# Patient Record
Sex: Male | Born: 1950 | Marital: Married | Smoking: Never smoker
Health system: Southern US, Community
[De-identification: ages and names within clinical notes are randomized; demographics above are authoritative.]

## PROBLEM LIST (undated history)

## (undated) DIAGNOSIS — H269 Unspecified cataract: Secondary | ICD-10-CM

## (undated) DIAGNOSIS — H35039 Hypertensive retinopathy, unspecified eye: Secondary | ICD-10-CM

## (undated) DIAGNOSIS — N189 Chronic kidney disease, unspecified: Secondary | ICD-10-CM

## (undated) DIAGNOSIS — I4821 Permanent atrial fibrillation: Secondary | ICD-10-CM

## (undated) DIAGNOSIS — I4891 Unspecified atrial fibrillation: Secondary | ICD-10-CM

## (undated) DIAGNOSIS — Z9289 Personal history of other medical treatment: Secondary | ICD-10-CM

## (undated) DIAGNOSIS — I10 Essential (primary) hypertension: Secondary | ICD-10-CM

## (undated) HISTORY — DX: Unspecified cataract: H26.9

## (undated) HISTORY — DX: Hypertensive retinopathy, unspecified eye: H35.039

## (undated) HISTORY — DX: Unspecified atrial fibrillation: I48.91

## (undated) HISTORY — DX: Chronic kidney disease, unspecified: N18.9

## (undated) HISTORY — DX: Permanent atrial fibrillation: I48.21

## (undated) HISTORY — PX: CATARACT EXTRACTION: SUR2

## (undated) HISTORY — DX: Personal history of other medical treatment: Z92.89

## (undated) HISTORY — DX: Essential (primary) hypertension: I10

---

## 2017-03-26 ENCOUNTER — Encounter (HOSPITAL_COMMUNITY): Payer: Self-pay | Admitting: Emergency Medicine

## 2017-03-26 ENCOUNTER — Emergency Department (HOSPITAL_COMMUNITY)
Admission: EM | Admit: 2017-03-26 | Discharge: 2017-03-26 | Disposition: A | Discharge status: Home / Self Care | Payer: Medicare Other | Attending: Emergency Medicine | Admitting: Emergency Medicine

## 2017-03-26 ENCOUNTER — Emergency Department (HOSPITAL_COMMUNITY): Payer: Medicare Other

## 2017-03-26 DIAGNOSIS — R002 Palpitations: Secondary | ICD-10-CM | POA: Diagnosis present

## 2017-03-26 DIAGNOSIS — I10 Essential (primary) hypertension: Secondary | ICD-10-CM | POA: Diagnosis not present

## 2017-03-26 DIAGNOSIS — F17210 Nicotine dependence, cigarettes, uncomplicated: Secondary | ICD-10-CM | POA: Diagnosis not present

## 2017-03-26 DIAGNOSIS — Z79899 Other long term (current) drug therapy: Secondary | ICD-10-CM | POA: Diagnosis not present

## 2017-03-26 DIAGNOSIS — I4891 Unspecified atrial fibrillation: Secondary | ICD-10-CM

## 2017-03-26 LAB — BASIC METABOLIC PANEL
Anion gap: 10 (ref 5–15)
BUN: 19 mg/dL (ref 6–20)
CALCIUM: 9.2 mg/dL (ref 8.9–10.3)
CO2: 25 mmol/L (ref 22–32)
CREATININE: 1.09 mg/dL (ref 0.61–1.24)
Chloride: 102 mmol/L (ref 101–111)
GFR calc Af Amer: >60 mL/min (ref >60)
GFR calc non Af Amer: >60 mL/min (ref >60)
GLUCOSE: 133 mg/dL — AB (ref 65–99)
Potassium: 3.8 mmol/L (ref 3.5–5.1)
Sodium: 137 mmol/L (ref 135–145)

## 2017-03-26 LAB — MAGNESIUM: Magnesium: 2.1 mg/dL (ref 1.7–2.4)

## 2017-03-26 LAB — CBC
HCT: 43.1 % (ref 39.0–52.0)
Hemoglobin: 15.2 g/dL (ref 13.0–17.0)
MCH: 29.2 pg (ref 26.0–34.0)
MCHC: 35.3 g/dL (ref 30.0–36.0)
MCV: 82.9 fL (ref 78.0–100.0)
PLATELETS: 153 10*3/uL (ref 150–400)
RBC: 5.2 MIL/uL (ref 4.22–5.81)
RDW: 12.9 % (ref 11.5–15.5)
WBC: 3.6 10*3/uL — ABNORMAL LOW (ref 4.0–10.5)

## 2017-03-26 LAB — I-STAT TROPONIN, ED: TROPONIN I, POC: 0 ng/mL (ref 0.00–0.08)

## 2017-03-26 MED ORDER — METOPROLOL TARTRATE 25 MG PO TABS: 25.0000 mg | ORAL_TABLET | Freq: Two times a day (BID) | ORAL | 0 refills | Status: DC

## 2017-03-26 MED ORDER — APIXABAN 5 MG PO TABS: 5.0000 mg | ORAL_TABLET | Freq: Two times a day (BID) | ORAL | 0 refills | Status: DC

## 2017-03-26 MED ORDER — METOPROLOL TARTRATE 25 MG PO TABS
25.0000 mg | ORAL_TABLET | Freq: Once | ORAL | Status: AC
  Administered 2017-03-26: 25 mg via ORAL
  Filled 2017-03-26: qty 1

## 2017-03-26 NOTE — ED Notes (Signed)
To x-ray

## 2017-03-26 NOTE — ED Triage Notes (Signed)
Pt went for work physical and was found to have htn and new onset afib

## 2017-03-26 NOTE — ED Provider Notes (Signed)
[facility] EMERGENCY DEPARTMENT Provider Note   CSN: [id number] Arrival date & time: 03/26/17  1331     History   Chief Complaint Chief Complaint  Patient presents with  . Palpitations    HPI Billy Harrell is a 66 y.o. male.  HPI  66 year old male sent in from [place] walk-in clinic for new onset atrial fibrillation.  The patient was having an insurance physical which she has every year.  Nurse noted that he had an irregular heart rate and sent him to the walk-in clinic for evaluation.  He was also noted to be hypertensive.  The patient states he has not actually seen a doctor in about 20 years.  He has no known medical problems but admits he has not seen a PCP either.  He denies any current palpitations.  When asked if he has had palpitations recently he states that he has irregular beating in his chest "sometimes".  When trying to clarify when exactly or when the most recent time was he has trouble clarifying.  However he seems to indicate that it has been an ongoing issue but mild.  No chest pain, headache, lightheadedness, shortness of breath.  No acute complaints. One BP taken by the nurse was 198/128.  History reviewed. No pertinent past medical history.  There are no active problems to display for this patient.   History reviewed. No pertinent surgical history.     Home Medications    Prior to Admission medications   Medication Sig Start Date End Date Taking? Authorizing Provider  cholecalciferol (VITAMIN D) 1000 units tablet Take 1,000 Units by mouth daily.   Yes [provider]  loratadine (CLARITIN) 10 MG tablet Take 10 mg by mouth daily.   Yes [provider]  Omega-3 Fatty Acids (FISH OIL) 1000 MG CAPS Take 1,000 mg by mouth daily.   Yes [provider]  apixaban (ELIQUIS) 5 MG TABS tablet Take 1 tablet (5 mg total) by mouth 2 (two) times daily. 03/26/17   Pricilla Loveless, MD  metoprolol tartrate (LOPRESSOR) 25 MG tablet  Take 1 tablet (25 mg total) by mouth 2 (two) times daily. 03/26/17   Pricilla Loveless, MD    Family History No family history on file.  Social History Social History  Substance Use Topics  . Smoking status: Current Every Day Smoker  . Smokeless tobacco: Never Used  . Alcohol use No     Allergies   Patient has no known allergies.   Review of Systems Review of Systems  Constitutional: Negative for fever.  Respiratory: Negative for shortness of breath.   Cardiovascular: Negative for chest pain.  Gastrointestinal: Negative for vomiting.  Neurological: Negative for dizziness, numbness and headaches.  All other systems reviewed and are negative.    Physical Exam Updated Vital Signs BP (!) 165/134   Pulse 87   Temp 98 F (36.7 C)   Resp 14   SpO2 97%   Physical Exam  Constitutional: He is oriented to person, place, and time. He appears well-developed and well-nourished. No distress.  HENT:  Head: Normocephalic and atraumatic.  Right Ear: External ear normal.  Left Ear: External ear normal.  Nose: Nose normal.  Eyes: Right eye exhibits no discharge. Left eye exhibits no discharge.  Neck: Neck supple.  Cardiovascular: Normal rate and normal heart sounds.  An irregular rhythm present.  Pulses:      Radial pulses are 2+ on the right side, and 2+ on the left side.  Pulmonary/Chest: Effort  normal and breath sounds normal.  Abdominal: Soft. There is no tenderness.  Musculoskeletal: He exhibits no edema.  Neurological: He is alert and oriented to person, place, and time.  Skin: Skin is warm and dry. He is not diaphoretic.  Nursing note and vitals reviewed.    ED Treatments / Results  Labs (all labs ordered are listed, but only abnormal results are displayed) Labs Reviewed  BASIC METABOLIC PANEL - Abnormal; Notable for the following:       Result Value   Glucose, Bld 133 (*)    All other components within normal limits  CBC - Abnormal; Notable for the following:     WBC 3.6 (*)    All other components within normal limits  MAGNESIUM  I-STAT TROPONIN, ED    EKG  EKG Interpretation  Date/Time:  Monday March 26 2017 13:36:15 EDT Ventricular Rate:  106 PR Interval:    QRS Duration: 86 QT Interval:  334 QTC Calculation: 443 R Axis:   90 Text Interpretation:  Atrial fibrillation with rapid ventricular response Rightward axis ST abnormality, possible digitalis effect Abnormal ECG No old tracing to compare Confirmed by Pricilla Loveless [phone]) on 03/26/2017 1:53:52 PM Also confirmed by Pricilla Loveless [phone]), editor Misty Stanley [phone])  on 03/26/2017 2:57:00 PM       Radiology Dg Chest 2 View  Result Date: 03/26/2017 CLINICAL DATA:  66 year old male with hypertension. EXAM: CHEST  2 VIEW COMPARISON:  None. FINDINGS: The lungs are clear and negative for focal airspace consolidation, pulmonary edema or suspicious pulmonary nodule. No pleural effusion or pneumothorax. Mild cardiomegaly. No acute fracture or lytic or blastic osseous lesions. The visualized upper abdominal bowel gas pattern is unremarkable. IMPRESSION: No active cardiopulmonary disease. Mild cardiomegaly. Electronically Signed   By: Malachy Moan M.D.   On: 03/26/2017 14:21    Procedures Procedures (including critical care time)  Medications Ordered in ED Medications  metoprolol tartrate (LOPRESSOR) tablet 25 mg (25 mg Oral Given 03/26/17 1534)     Initial Impression / Assessment and Plan / ED Course  I have reviewed the triage vital signs and the nursing notes.  Pertinent labs & imaging results that were available during my care of the patient were reviewed by me and considered in my medical decision making (see chart for details).     The patient is asymptomatic.  He does have A. fib, bouncing between the 90s and low 100s.  He is well-appearing.  No anginal symptoms.  Given he is not symptomatically now and there is no clear onset, he is not a candidate for ED  cardioversion.  He denies any significant past medical history but has not seen a PCP in 20+ years.  He has been quite hypertensive today and my suspicion is he has a history of hypertension.  Thus, we will treat as if he has hypertension and his chads vasc score is now 2.  Lab work unremarkable.  He appears stable for outpatient management with metoprolol and Eliquis.  I discussed risks and benefits of anticoagulation and he understands.  Discussed return precautions.  Follow-up in A. fib clinic.  This patients CHA2DS2-VASc Score and unadjusted Ischemic Stroke Rate (% per year) is equal to 2.2 % stroke rate/year from a score of 2  Above score calculated as 1 point each if present [CHF, HTN, DM, Vascular=MI/PAD/Aortic Plaque, Age if 65-74, or Male] Above score calculated as 2 points each if present [Age > 75, or Stroke/TIA/TE]   Final Clinical Impressions(s) / ED  Diagnoses   Final diagnoses:  New onset atrial fibrillation (HCC)  Essential hypertension    New Prescriptions New Prescriptions   APIXABAN (ELIQUIS) 5 MG TABS TABLET    Take 1 tablet (5 mg total) by mouth 2 (two) times daily.   METOPROLOL TARTRATE (LOPRESSOR) 25 MG TABLET    Take 1 tablet (25 mg total) by mouth 2 (two) times daily.     Pricilla LovelessGoldston, Sula Fetterly, MD 03/26/17 [phone]

## 2017-04-02 ENCOUNTER — Encounter (HOSPITAL_COMMUNITY): Payer: Self-pay | Admitting: Nurse Practitioner

## 2017-04-02 ENCOUNTER — Ambulatory Visit (HOSPITAL_COMMUNITY)
Admission: RE | Admit: 2017-04-02 | Discharge: 2017-04-02 | Disposition: A | Discharge status: Home / Self Care | Payer: Medicare Other | Source: Ambulatory Visit | Attending: Nurse Practitioner | Admitting: Nurse Practitioner

## 2017-04-02 VITALS — BP 164/134 | HR 70 | Resp 98 | Wt 192.8 lb

## 2017-04-02 DIAGNOSIS — Z79899 Other long term (current) drug therapy: Secondary | ICD-10-CM | POA: Insufficient documentation

## 2017-04-02 DIAGNOSIS — I481 Persistent atrial fibrillation: Secondary | ICD-10-CM

## 2017-04-02 DIAGNOSIS — Z7901 Long term (current) use of anticoagulants: Secondary | ICD-10-CM | POA: Insufficient documentation

## 2017-04-02 DIAGNOSIS — F17200 Nicotine dependence, unspecified, uncomplicated: Secondary | ICD-10-CM | POA: Insufficient documentation

## 2017-04-02 DIAGNOSIS — I10 Essential (primary) hypertension: Secondary | ICD-10-CM | POA: Diagnosis not present

## 2017-04-02 DIAGNOSIS — I4891 Unspecified atrial fibrillation: Secondary | ICD-10-CM | POA: Diagnosis not present

## 2017-04-02 DIAGNOSIS — I4819 Other persistent atrial fibrillation: Secondary | ICD-10-CM

## 2017-04-02 MED ORDER — LOSARTAN POTASSIUM 50 MG PO TABS: 50.0000 mg | ORAL_TABLET | Freq: Every day | ORAL | 3 refills | Status: DC

## 2017-04-02 NOTE — Patient Instructions (Signed)
Your physician has recommended you make the following change in your medication: 1)Start Losartan 50mg  once a day

## 2017-04-02 NOTE — Progress Notes (Signed)
Primary Care Physician: Patient, No Pcp Per Referring Physician: [facility] f/u   Billy Harrell is a 66 y.o. male that was found to be in afib when an [facility] nurse made a visit and found pt to be in afib.He was sent to [facility] and was not established there and was sent on to the ER. In the ER he was found to be very hypertensive. He does not go to the Doctor. He does not have a PCP. 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 He has not really noted any fatigue or shortness of breathe.BP remains elevated today. He states that he usually feels well. He was not cardioverted in the ER, due to not knowing onset of afib. Pt cannot identify seeing  condition change over the last few weeks to months. He was started on Eliquis 5 mg bid for a chadsvasc score of 2(age, htn).  Denies, any alcohol, tobacco use, no excessive caffeine use, snoring unknown as pt lives by himself. Here with a daughter today.  Today, he denies symptoms of palpitations, chest pain, shortness of breath, orthopnea, PND, lower extremity edema, dizziness, presyncope, syncope, or neurologic sequela. The patient is tolerating medications without difficulties and is otherwise without complaint today.   No past medical history on file. No past surgical history on file.  Current Outpatient Prescriptions  Medication Sig Dispense Refill  . apixaban (ELIQUIS) 5 MG TABS tablet Take 1 tablet (5 mg total) by mouth 2 (two) times daily. 60 tablet 0  . cholecalciferol (VITAMIN D) 1000 units tablet Take 1,000 Units by mouth daily.    Marland Kitchen loratadine (CLARITIN) 10 MG tablet Take 10 mg by mouth daily.    . metoprolol tartrate (LOPRESSOR) 25 MG tablet Take 1 tablet (25 mg total) by mouth 2 (two) times daily. 60 tablet 0  . Omega-3 Fatty Acids (FISH OIL) 1000 MG CAPS Take 1,000 mg by mouth daily.    . Oxymetazoline HCl (NASAL SPRAY NA) Place into the nose daily.    Marland Kitchen losartan (COZAAR) 50 MG tablet Take 1 tablet (50  mg total) by mouth daily. 30 tablet 3   No current facility-administered medications for this encounter.     No Known Allergies  Social History   Social History  . Marital status: Unknown    Spouse name: N/A  . Number of children: N/A  . Years of education: N/A   Occupational History  . Not on file.   Social History Main Topics  . Smoking status: Current Every Day Smoker  . Smokeless tobacco: Never Used  . Alcohol use No  . Drug use: No  . Sexual activity: Not on file   Other Topics Concern  . Not on file   Social History Narrative  . No narrative on file    No family history on file.  ROS- All systems are reviewed and negative except as per the HPI above  Physical Exam: Vitals:   04/02/17 0938  BP: (!) 164/134  Pulse: 70  Resp: (!) 98  Weight: 192 lb 12 oz (87.4 kg)   Wt Readings from Last 3 Encounters:  04/02/17 192 lb 12 oz (87.4 kg)    Labs: Lab Results  Component Value Date   NA 137 03/26/2017   K 3.8 03/26/2017   CL 102 03/26/2017   CO2 25 03/26/2017   GLUCOSE 133 (H) 03/26/2017   BUN 19 03/26/2017   CREATININE 1.09 03/26/2017   CALCIUM 9.2 03/26/2017  MG 2.1 03/26/2017   No results found for: INR No results found for: CHOL, HDL, LDLCALC, TRIG   GEN- The patient is well appearing, alert and oriented x 3 today.   Head- normocephalic, atraumatic Eyes-  Sclera clear, conjunctiva pink Ears- hearing intact Oropharynx- clear Neck- supple, no JVP Lymph- no cervical lymphadenopathy Lungs- Clear to ausculation bilaterally, normal work of breathing Heart- irregular rate and rhythm, no murmurs, rubs or gallops, PMI not laterally displaced GI- soft, NT, ND, + BS Extremities- no clubbing, cyanosis, or edema MS- no significant deformity or atrophy Skin- no rash or lesion Psych- euthymic mood, full affect Neuro- strength and sensation are intact  EKG-atrial fib at 73 bpm, qrs int 86 ms, qtc 434 ms Epic records reviewed    Assessment and  Plan: 1. New onset afib General education re afib Unsure as to onset, but will continue to  load on eliquis 5 mg x 3 weeks(chadsvasc score of 2) and then discuss cardioversion Denies any bleeding issues, bleeding precautions discussed Echo  2. HTN May be trigger for afib Will add losartan 50 mg a day Recheck BP with bmet/TSH on Friday after echo appointment Avoid salt  Encouraged to get established with PCP  Elvina Sidleonna C. Matthew Folksarroll, ANP-C [facility]  [address] [place], [place] [address] [phone]

## 2017-04-05 ENCOUNTER — Ambulatory Visit (HOSPITAL_COMMUNITY): Payer: Medicare Other | Admitting: Nurse Practitioner

## 2017-04-06 ENCOUNTER — Ambulatory Visit (HOSPITAL_COMMUNITY)
Admission: RE | Admit: 2017-04-06 | Discharge: 2017-04-06 | Disposition: A | Discharge status: Home / Self Care | Payer: Medicare Other | Source: Ambulatory Visit | Attending: Nurse Practitioner | Admitting: Nurse Practitioner

## 2017-04-06 ENCOUNTER — Encounter (HOSPITAL_COMMUNITY): Payer: Self-pay | Admitting: Nurse Practitioner

## 2017-04-06 VITALS — BP 160/120 | HR 96 | Wt 191.4 lb

## 2017-04-06 DIAGNOSIS — I77811 Abdominal aortic ectasia: Secondary | ICD-10-CM | POA: Diagnosis not present

## 2017-04-06 DIAGNOSIS — I481 Persistent atrial fibrillation: Secondary | ICD-10-CM | POA: Insufficient documentation

## 2017-04-06 DIAGNOSIS — I081 Rheumatic disorders of both mitral and tricuspid valves: Secondary | ICD-10-CM | POA: Insufficient documentation

## 2017-04-06 DIAGNOSIS — Z720 Tobacco use: Secondary | ICD-10-CM | POA: Insufficient documentation

## 2017-04-06 DIAGNOSIS — I371 Nonrheumatic pulmonary valve insufficiency: Secondary | ICD-10-CM | POA: Diagnosis not present

## 2017-04-06 DIAGNOSIS — I4819 Other persistent atrial fibrillation: Secondary | ICD-10-CM

## 2017-04-06 DIAGNOSIS — I10 Essential (primary) hypertension: Secondary | ICD-10-CM

## 2017-04-06 LAB — BASIC METABOLIC PANEL
ANION GAP: 7 (ref 5–15)
BUN: 27 mg/dL — ABNORMAL HIGH (ref 6–20)
CALCIUM: 9.2 mg/dL (ref 8.9–10.3)
CO2: 26 mmol/L (ref 22–32)
Chloride: 106 mmol/L (ref 101–111)
Creatinine, Ser: 1.25 mg/dL — ABNORMAL HIGH (ref 0.61–1.24)
GFR, EST NON AFRICAN AMERICAN: 58 mL/min — AB (ref >60)
Glucose, Bld: 96 mg/dL (ref 65–99)
Potassium: 4.3 mmol/L (ref 3.5–5.1)
Sodium: 139 mmol/L (ref 135–145)

## 2017-04-06 LAB — TSH: TSH: 1.674 u[IU]/mL (ref 0.350–4.500)

## 2017-04-06 NOTE — Progress Notes (Signed)
  Echocardiogram 2D Echocardiogram has been performed.  Billy SavoyCasey N Rakesha Harrell 04/06/2017, 8:54 AM

## 2017-04-06 NOTE — Progress Notes (Signed)
Pt BP still elevated, but has not taken metoprolol this am, I got 180/100. He did start losartan 50 mg 3 days ago. He will get a BP cuff and track readings at home, see back in one week for review BP, echo and afib. Bmet/tsh today

## 2017-04-13 ENCOUNTER — Ambulatory Visit (HOSPITAL_COMMUNITY)
Admission: RE | Admit: 2017-04-13 | Discharge: 2017-04-13 | Disposition: A | Discharge status: Home / Self Care | Payer: Medicare Other | Source: Ambulatory Visit | Attending: Nurse Practitioner | Admitting: Nurse Practitioner

## 2017-04-13 VITALS — BP 166/104 | HR 67

## 2017-04-13 DIAGNOSIS — F17200 Nicotine dependence, unspecified, uncomplicated: Secondary | ICD-10-CM | POA: Insufficient documentation

## 2017-04-13 DIAGNOSIS — I10 Essential (primary) hypertension: Secondary | ICD-10-CM | POA: Insufficient documentation

## 2017-04-13 DIAGNOSIS — Z7901 Long term (current) use of anticoagulants: Secondary | ICD-10-CM | POA: Insufficient documentation

## 2017-04-13 DIAGNOSIS — Z79899 Other long term (current) drug therapy: Secondary | ICD-10-CM | POA: Insufficient documentation

## 2017-04-13 DIAGNOSIS — I480 Paroxysmal atrial fibrillation: Secondary | ICD-10-CM | POA: Diagnosis not present

## 2017-04-13 DIAGNOSIS — I481 Persistent atrial fibrillation: Secondary | ICD-10-CM

## 2017-04-13 DIAGNOSIS — I4891 Unspecified atrial fibrillation: Secondary | ICD-10-CM | POA: Diagnosis present

## 2017-04-13 DIAGNOSIS — I4819 Other persistent atrial fibrillation: Secondary | ICD-10-CM

## 2017-04-13 NOTE — Patient Instructions (Signed)
Cardioversion scheduled for Monday, November 26th  - Come for labs at 9am in afib clinic  - Ar[facility] at [facility] and go to admitting at 9:30AM  -Do not eat or drink anything after midnight the night prior to your procedure.  - Take all your medication with a sip of water prior to arrival.  - You will not be able to drive home after your procedure.

## 2017-04-13 NOTE — Progress Notes (Signed)
Primary Care Physician: Patient, No Pcp Per [facility] Phys[facility]   Farrel Gobblenis Beckel is a 66 y.o. male that was found to be in afib when [facility] a visit and found pt to be in afib.He was sent to [facility] and was not established there so was sent on to the ER. In the ER he was found to be very hypertensive. He does not go to the Doctor. He does not have a PCP. 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 He has not really noted any fatigue or shortness of breathe.BP remains elevated today. He states that he usually feels well. He was not cardioverted in the ER, due to not knowing onset of afib. Pt cannot identify seeing condition change over the last few weeks to months. He was started on Eliquis 5 mg bid for a chadsvasc score of 2(age, htn).  Denies, any alcohol, tobacco use, no excessive caffeine use, snoring unknown as pt lives by himself. Here with a daughter today.  F/u in afib clinic, for review. He was started on losartan for poorly controlled BP. He bought a BP cuff and the daughter has been helping him track his pressure. His readings at home much improved but not well controlled today. BP recheck by me 150/100. BP's at home have been running around 120 systolic. He will soon be on DOAC x 3 weeks and discussed pursing cardioversion with pt/dauhgter and they want to proceed.  Today, he denies symptoms of palpitations, chest pain, shortness of breath, orthopnea, PND, lower extremity edema, dizziness, presyncope, syncope, or neurologic sequela. The patient is tolerating medications without difficulties and is otherwise without complaint today.   No past medical history on file. No past surgical history on file.  Current Outpatient Medications  Medication Sig Dispense Refill  . apixaban (ELIQUIS) 5 MG TABS tablet Take 1 tablet (5 mg total) by mouth 2 (two) times daily. 60 tablet 0  . cholecalciferol (VITAMIN D) 1000 units tablet Take  1,000 Units by mouth daily.    Marland Kitchen. loratadine (CLARITIN) 10 MG tablet Take 10 mg by mouth daily.    Marland Kitchen. losartan (COZAAR) 50 MG tablet Take 1 tablet (50 mg total) by mouth daily. 30 tablet 3  . metoprolol tartrate (LOPRESSOR) 25 MG tablet Take 1 tablet (25 mg total) by mouth 2 (two) times daily. 60 tablet 0  . Omega-3 Fatty Acids (FISH OIL) 1000 MG CAPS Take 1,000 mg by mouth daily.    . Oxymetazoline HCl (NASAL SPRAY NA) Place into the nose daily.     No current facility-administered medications for this encounter.     No Known Allergies  Social History   Socioeconomic History  . Marital status: Unknown    Spouse name: Not on file  . Number of children: Not on file  . Years of education: Not on file  . Highest education level: Not on file  Social Needs  . Financial resource strain: Not on file  . Food insecurity - worry: Not on file  . Food insecurity - inability: Not on file  . Transportation needs - medical: Not on file  . Transportation needs - non-medical: Not on file  Occupational History  . Not on file  Tobacco Use  . Smoking status: Current Every Day Smoker  . Smokeless tobacco: Never Used  Substance and Sexual Activity  . Alcohol use: No  . Drug use: No  . Sexual activity: Not on file  Other Topics Concern  . Not on file  Social History Narrative  . Not on file    No family history on file.  ROS- All systems are reviewed and negative except as per the HPI above  Physical Exam: Vitals:   04/13/17 1029  BP: (!) 166/104  Pulse: 67  SpO2: 98%   Wt Readings from Last 3 Encounters:  04/06/17 191 lb 6 oz (86.8 kg)  04/02/17 192 lb 12 oz (87.4 kg)    Labs: Lab Results  Component Value Date   NA 139 04/06/2017   K 4.3 04/06/2017   CL 106 04/06/2017   CO2 26 04/06/2017   GLUCOSE 96 04/06/2017   BUN 27 (H) 04/06/2017   CREATININE 1.25 (H) 04/06/2017   CALCIUM 9.2 04/06/2017   MG 2.1 03/26/2017   No results found for: INR No results found for: CHOL,  HDL, LDLCALC, TRIG   GEN- The patient is well appearing, alert and oriented x 3 today.   Head- normocephalic, atraumatic Eyes-  Sclera clear, conjunctiva pink Ears- hearing intact Oropharynx- clear Neck- supple, no JVP Lymph- no cervical lymphadenopathy Lungs- Clear to ausculation bilaterally, normal work of breathing Heart- irregular rate and rhythm, no murmurs, rubs or gallops, PMI not laterally displaced GI- soft, NT, ND, + BS Extremities- no clubbing, cyanosis, or edema MS- no significant deformity or atrophy Skin- no rash or lesion Psych- euthymic mood, full affect Neuro- strength and sensation are intact  EKG-not done today, irregular on ascultation  Epic records reviewed Echo-Study Conclusions  - Left ventricle: The cavity size was normal. There was mild focal   basal hypertrophy of the septum. Systolic function was normal.   The estimated ejection fraction was in the range of 55% to 60%.   Wall motion was normal; there were no regional wall motion   abnormalities. The study was not technically sufficient to allow   evaluation of LV diastolic dysfunction due to atrial   fibrillation. - Aortic valve: There was mild regurgitation. - Aortic root: The aortic root wasmildly dilated measuring 42 mm. - Ascending aorta: The ascending aorta was normal in size. - Mitral valve: There was mild regurgitation. - Left atrium: The atrium was moderately dilated.49 mm - Right ventricle: Systolic function was normal. - Right atrium: The atrium was moderately dilated. - Tricuspid valve: There was mild regurgitation. - Pulmonary arteries: Systolic pressure was mildly increased. PA   peak pressure: 32 mm Hg (S). - Inferior vena cava: The vessel was normal in size.    Assessment and Plan: 1. New onset afib General education re afib Unsure as to onset, but will continue to  load on eliquis 5 mg x 3 weeks(chadsvasc score of 2), started 10/22, will set up cardioversion for 11/29,  delayed for pt's preference for a Monday cardioversion States no missed doses since staring eliquis Denies any bleeding issues, bleeding precautions discussed Will obtain labs for cardioversion day of procedure  2. HTN May be trigger for afib Will add losartan 50 mg a day BP 's are trending better at home, although up today A Avoid salt  F/u here am of DCCV for EKG/labs  Encouraged to get established with PCP  Elvina Sidleonna C. Matthew Folksarroll, ANP-C [facility] [place] [address] [place], [place] [address] [phone]

## 2017-04-20 ENCOUNTER — Other Ambulatory Visit (HOSPITAL_COMMUNITY): Payer: Self-pay | Admitting: *Deleted

## 2017-04-23 ENCOUNTER — Other Ambulatory Visit (HOSPITAL_COMMUNITY): Payer: Self-pay | Admitting: *Deleted

## 2017-04-23 MED ORDER — APIXABAN 5 MG PO TABS: 5.0000 mg | ORAL_TABLET | Freq: Two times a day (BID) | ORAL | 2 refills | Status: DC

## 2017-04-23 MED ORDER — METOPROLOL TARTRATE 25 MG PO TABS: 25.0000 mg | ORAL_TABLET | Freq: Two times a day (BID) | ORAL | 2 refills | Status: DC

## 2017-04-30 ENCOUNTER — Other Ambulatory Visit: Payer: Self-pay

## 2017-04-30 ENCOUNTER — Ambulatory Visit (HOSPITAL_COMMUNITY): Payer: Medicare Other | Admitting: Certified Registered Nurse Anesthetist

## 2017-04-30 ENCOUNTER — Encounter (HOSPITAL_COMMUNITY): Payer: Self-pay | Admitting: Nurse Practitioner

## 2017-04-30 ENCOUNTER — Encounter (HOSPITAL_COMMUNITY)
Admission: RE | Disposition: A | Discharge status: Home / Self Care | Payer: Self-pay | Source: Ambulatory Visit | Attending: Cardiovascular Disease

## 2017-04-30 ENCOUNTER — Encounter (HOSPITAL_COMMUNITY): Payer: Self-pay | Admitting: *Deleted

## 2017-04-30 ENCOUNTER — Ambulatory Visit (HOSPITAL_COMMUNITY)
Admission: RE | Admit: 2017-04-30 | Discharge: 2017-04-30 | Disposition: A | Discharge status: Home / Self Care | Payer: Medicare Other | Source: Ambulatory Visit | Attending: Nurse Practitioner | Admitting: Nurse Practitioner

## 2017-04-30 ENCOUNTER — Ambulatory Visit (HOSPITAL_COMMUNITY)
Admission: RE | Admit: 2017-04-30 | Discharge: 2017-04-30 | Disposition: A | Discharge status: Home / Self Care | Payer: Medicare Other | Source: Ambulatory Visit | Attending: Cardiovascular Disease | Admitting: Cardiovascular Disease

## 2017-04-30 DIAGNOSIS — F17210 Nicotine dependence, cigarettes, uncomplicated: Secondary | ICD-10-CM | POA: Insufficient documentation

## 2017-04-30 DIAGNOSIS — I4819 Other persistent atrial fibrillation: Secondary | ICD-10-CM

## 2017-04-30 DIAGNOSIS — I4891 Unspecified atrial fibrillation: Secondary | ICD-10-CM | POA: Diagnosis present

## 2017-04-30 DIAGNOSIS — I481 Persistent atrial fibrillation: Secondary | ICD-10-CM

## 2017-04-30 DIAGNOSIS — I10 Essential (primary) hypertension: Secondary | ICD-10-CM | POA: Insufficient documentation

## 2017-04-30 HISTORY — PX: CARDIOVERSION: SHX1299

## 2017-04-30 LAB — CBC
HCT: 43.3 % (ref 39.0–52.0)
Hemoglobin: 15.1 g/dL (ref 13.0–17.0)
MCH: 28.9 pg (ref 26.0–34.0)
MCHC: 34.9 g/dL (ref 30.0–36.0)
MCV: 83 fL (ref 78.0–100.0)
PLATELETS: 165 10*3/uL (ref 150–400)
RBC: 5.22 MIL/uL (ref 4.22–5.81)
RDW: 12.5 % (ref 11.5–15.5)
WBC: 4.7 10*3/uL (ref 4.0–10.5)

## 2017-04-30 LAB — BASIC METABOLIC PANEL
Anion gap: 8 (ref 5–15)
BUN: 29 mg/dL — AB (ref 6–20)
CHLORIDE: 104 mmol/L (ref 101–111)
CO2: 25 mmol/L (ref 22–32)
CREATININE: 1.23 mg/dL (ref 0.61–1.24)
Calcium: 9 mg/dL (ref 8.9–10.3)
GFR calc Af Amer: >60 mL/min (ref >60)
GFR calc non Af Amer: 59 mL/min — ABNORMAL LOW (ref >60)
GLUCOSE: 91 mg/dL (ref 65–99)
Potassium: 4.6 mmol/L (ref 3.5–5.1)
SODIUM: 137 mmol/L (ref 135–145)

## 2017-04-30 SURGERY — CARDIOVERSION
Anesthesia: General

## 2017-04-30 MED ORDER — LIDOCAINE 2% (20 MG/ML) 5 ML SYRINGE
INTRAMUSCULAR | Status: DC | PRN
  Administered 2017-04-30: 100 mg via INTRAVENOUS

## 2017-04-30 MED ORDER — SODIUM CHLORIDE 0.9 % IV SOLN
INTRAVENOUS | Status: DC | PRN
  Administered 2017-04-30: 10:00:00 via INTRAVENOUS

## 2017-04-30 MED ORDER — PROPOFOL 10 MG/ML IV BOLUS
INTRAVENOUS | Status: DC | PRN
Start: 2017-04-30 — End: 2017-04-30
  Administered 2017-04-30: 50 mg via INTRAVENOUS

## 2017-04-30 NOTE — Anesthesia Postprocedure Evaluation (Signed)
Anesthesia Post Note  Patient: Billy Harrell  Procedure(s) Performed: CARDIOVERSION (N/A )     Patient location during evaluation: PACU Anesthesia Type: General Level of consciousness: awake and alert Pain management: pain level controlled Vital Signs Assessment: post-procedure vital signs reviewed and stable Respiratory status: spontaneous breathing, nonlabored ventilation and respiratory function stable Cardiovascular status: blood pressure returned to baseline and stable Postop Assessment: no apparent nausea or vomiting Anesthetic complications: no    Last Vitals:  Vitals:   04/30/17 1038 04/30/17 1043  BP: 129/85 124/85  Pulse: (!) 57 (!) 50  Resp: 20 19  Temp:  36.9 C  SpO2: 94% 96%    Last Pain:  Vitals:   04/30/17 1043  TempSrc: Oral                 Lowella CurbWarren Ray Miller

## 2017-04-30 NOTE — H&P (View-Only) (Signed)
Pt in for Labs and EKG prior to cardioversion this am. No missed doses of DOAC for at least 3 weeks. General procedure of cardioversion reviewed with pt and family. Ekg shows afib at 71 bpm, qrs int 84 ms, qtc 410 ms.

## 2017-04-30 NOTE — Anesthesia Preprocedure Evaluation (Signed)
Anesthesia Evaluation  Patient identified by MRN, date of birth, ID band Patient awake    Reviewed: Allergy & Precautions, NPO status , Patient's Chart, lab work & pertinent test results  Airway Mallampati: II  TM Distance: >3 FB Neck ROM: Full    Dental no notable dental hx.    Pulmonary neg pulmonary ROS, Current Smoker,    Pulmonary exam normal breath sounds clear to auscultation       Cardiovascular negative cardio ROS Normal cardiovascular exam+ dysrhythmias Atrial Fibrillation  Rhythm:Regular Rate:Normal     Neuro/Psych negative neurological ROS  negative psych ROS   GI/Hepatic negative GI ROS, Neg liver ROS,   Endo/Other  negative endocrine ROS  Renal/GU negative Renal ROS  negative genitourinary   Musculoskeletal negative musculoskeletal ROS (+)   Abdominal   Peds negative pediatric ROS (+)  Hematology negative hematology ROS (+)   Anesthesia Other Findings   Reproductive/Obstetrics negative OB ROS                             Anesthesia Physical Anesthesia Plan  ASA: III  Anesthesia Plan: General   Post-op Pain Management:    Induction: Intravenous  PONV Risk Score and Plan: 1 and Treatment may vary due to age or medical condition  Airway Management Planned: Mask  Additional Equipment:   Intra-op Plan:   Post-operative Plan:   Informed Consent: I have reviewed the patients History and Physical, chart, labs and discussed the procedure including the risks, benefits and alternatives for the proposed anesthesia with the patient or authorized representative who has indicated his/her understanding and acceptance.   Dental advisory given  Plan Discussed with: CRNA  Anesthesia Plan Comments:         Anesthesia Quick Evaluation

## 2017-04-30 NOTE — Anesthesia Procedure Notes (Signed)
Procedure Name: General with mask airway Date/Time: 04/30/2017 10:34 AM Performed by: Waynard EdwardsSmith, Firmin Belisle A, CRNA Pre-anesthesia Checklist: Patient identified, Emergency Drugs available, Suction available and Patient being monitored Patient Re-evaluated:Patient Re-evaluated prior to induction Oxygen Delivery Method: Ambu bag Preoxygenation: Pre-oxygenation with 100% oxygen Induction Type: IV induction Ventilation: Mask ventilation without difficulty

## 2017-04-30 NOTE — Transfer of Care (Signed)
Immediate Anesthesia Transfer of Care Note  Patient: Billy Harrell  Procedure(s) Performed: CARDIOVERSION (N/A )  Patient Location: Endoscopy Unit  Anesthesia Type:General  Level of Consciousness: drowsy and patient cooperative  Airway & Oxygen Therapy: Patient Spontanous Breathing  Post-op Assessment: Report given to RN and Post -op Vital signs reviewed and stable  Post vital signs: Reviewed and stable  Last Vitals:  Vitals:   04/30/17 1037 04/30/17 1038  BP:  129/85  Pulse: (!) 33 (!) 57  Resp: 19 20  Temp:    SpO2: 100% 94%    Last Pain:  Vitals:   04/30/17 0955  TempSrc: Oral         Complications: No apparent anesthesia complications

## 2017-04-30 NOTE — CV Procedure (Signed)
DCC: Anesthesia Miller 50 mg propofol 100 mg lidocaine  Shock x 2 120 J then 200 J Converted from afib to NSR rate 68  On Rx eliquis with no missed doses Needs further f/u and Rx for HTN  [employer] Keny Donald

## 2017-04-30 NOTE — Progress Notes (Signed)
Pt in for repeat EKG and lab work before DCCV scheduled for today.  To be reviewed by Rudi Cocoonna Carroll, NP

## 2017-04-30 NOTE — Interval H&P Note (Signed)
History and Physical Interval Note:  04/30/2017 10:16 AM  Billy Harrell  has presented today for surgery, with the diagnosis of AFIB  The various methods of treatment have been discussed with the patient and family. After consideration of risks, benefits and other options for treatment, the patient has consented to  Procedure(s): CARDIOVERSION (N/A) as a surgical intervention .  The patient's history has been reviewed, patient examined, no change in status, stable for surgery.  I have reviewed the patient's chart and labs.  Questions were answered to the patient's satisfaction.     Charlton HawsPeter Kaly Mcquary

## 2017-04-30 NOTE — Progress Notes (Signed)
Pt in for Labs and EKG prior to cardioversion this am. No missed doses of DOAC for at least 3 weeks. General procedure of cardioversion reviewed with pt and family. Ekg shows afib at 71 bpm, qrs int 84 ms, qtc 410 ms. 

## 2017-05-07 ENCOUNTER — Encounter (HOSPITAL_COMMUNITY): Payer: Self-pay | Admitting: Nurse Practitioner

## 2017-05-07 ENCOUNTER — Ambulatory Visit (HOSPITAL_COMMUNITY)
Admission: RE | Admit: 2017-05-07 | Discharge: 2017-05-07 | Disposition: A | Discharge status: Home / Self Care | Payer: Medicare Other | Source: Ambulatory Visit | Attending: Nurse Practitioner | Admitting: Nurse Practitioner

## 2017-05-07 VITALS — BP 154/86 | HR 57 | Ht 69.0 in | Wt 192.6 lb

## 2017-05-07 DIAGNOSIS — I10 Essential (primary) hypertension: Secondary | ICD-10-CM | POA: Diagnosis not present

## 2017-05-07 DIAGNOSIS — I4891 Unspecified atrial fibrillation: Secondary | ICD-10-CM | POA: Insufficient documentation

## 2017-05-07 DIAGNOSIS — I481 Persistent atrial fibrillation: Secondary | ICD-10-CM | POA: Diagnosis not present

## 2017-05-07 DIAGNOSIS — F17200 Nicotine dependence, unspecified, uncomplicated: Secondary | ICD-10-CM | POA: Diagnosis not present

## 2017-05-07 DIAGNOSIS — Z79899 Other long term (current) drug therapy: Secondary | ICD-10-CM | POA: Insufficient documentation

## 2017-05-07 DIAGNOSIS — I4819 Other persistent atrial fibrillation: Secondary | ICD-10-CM

## 2017-05-07 DIAGNOSIS — Z9889 Other specified postprocedural states: Secondary | ICD-10-CM | POA: Diagnosis not present

## 2017-05-07 NOTE — Progress Notes (Signed)
Primary Care Physician: Patient, No Pcp Per Ref[facility] Phys[facility]   Billy Harrell is a 66 y.o. male that was found to be in afib when [facility] a visit and found pt to be in afib.[facility] and was not established there so was sent on to the ER. In the ER he was found to be very hypertensive. He does not go to the Doctor. He does not have a PCP. 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 He has not really noted any fatigue or shortness of breathe.BP remains elevated today. He states that he usually feels well. He was not cardioverted in the ER, due to not knowing onset of afib. Pt cannot identify seeing condition change over the last few weeks to months. He was started on Eliquis 5 mg bid for a chadsvasc score of 2(age, htn).  Denies, any alcohol, tobacco use, no excessive caffeine use, snoring unknown as pt lives by himself. Here with a daughter today.  F/u in afib clinic, for review. He was started on losartan for poorly controlled BP. He bought a BP cuff and the daughter has been helping him track his pressure. His readings at home much improved but not well controlled today. BP recheck by me 150/100. BP's at home have been running around 120 systolic. He will soon be on DOAC x 3 weeks and discussed pursing cardioversion with pt/dauhgter and they want to proceed.  F/u successful cardioversion, 11/9,and continues in SR. Before, he did not know if he felt any worse in afib,  now he is aware that he does feel better, more energy. His BP is slightly elevated, States he gets "scared"  going to there doctor, but daughter states readings home home around the 130 range. Continues on eliquis and reminded to not miss any doses 30 days after cardioversion.   Today, he denies symptoms of palpitations, chest pain, shortness of breath, orthopnea, PND, lower extremity edema, dizziness, presyncope, syncope, or neurologic sequela. The  patient is tolerating medications without difficulties and is otherwise without complaint today.   No past medical history on file. Past Surgical History:  Procedure Laterality Date  . CARDIOVERSION N/A 04/30/2017   Procedure: CARDIOVERSION;  Surgeon: Wendall StadeNishan, Peter C, MD;  [facility];  Service: Cardiovascular;  Laterality: N/A;    Current Outpatient Medications  Medication Sig Dispense Refill  . apixaban (ELIQUIS) 5 MG TABS tablet Take 1 tablet (5 mg total) 2 (two) times daily by mouth. 60 tablet 2  . losartan (COZAAR) 50 MG tablet Take 1 tablet (50 mg total) by mouth daily. 30 tablet 3  . metoprolol tartrate (LOPRESSOR) 25 MG tablet Take 1 tablet (25 mg total) 2 (two) times daily by mouth. 60 tablet 2  . Multiple Vitamin (MULTI-VITAMIN PO) Take 1 tablet by mouth daily.    . Omega-3 Fatty Acids (FISH OIL) 1000 MG CAPS Take 1,000 mg by mouth daily.    . Oxymetazoline HCl (NASAL SPRAY NA) Place 1 spray into the nose daily.      No current facility-administered medications for this encounter.     No Known Allergies  Social History   Socioeconomic History  . Marital status: Married    Spouse name: Not on file  . Number of children: Not on file  . Years of education: Not on file  . Highest education level: Not on file  Social Needs  . Financial resource strain: Not  on file  . Food insecurity - worry: Not on file  . Food insecurity - inability: Not on file  . Transportation needs - medical: Not on file  . Transportation needs - non-medical: Not on file  Occupational History  . Not on file  Tobacco Use  . Smoking status: Current Every Day Smoker  . Smokeless tobacco: Never Used  Substance and Sexual Activity  . Alcohol use: No  . Drug use: No  . Sexual activity: Not on file  Other Topics Concern  . Not on file  Social History Narrative  . Not on file    No family history on file.  ROS- All systems are reviewed and negative except as per the HPI  above  Physical Exam: Vitals:   05/07/17 0906  BP: (!) 154/86  Pulse: (!) 57  Weight: 192 lb 9.6 oz (87.4 kg)  Height: 5\' 9"  (1.753 m)   Wt Readings from Last 3 Encounters:  05/07/17 192 lb 9.6 oz (87.4 kg)  04/30/17 193 lb (87.5 kg)  04/06/17 191 lb 6 oz (86.8 kg)    Labs: Lab Results  Component Value Date   NA 137 04/30/2017   K 4.6 04/30/2017   CL 104 04/30/2017   CO2 25 04/30/2017   GLUCOSE 91 04/30/2017   BUN 29 (H) 04/30/2017   CREATININE 1.23 04/30/2017   CALCIUM 9.0 04/30/2017   MG 2.1 03/26/2017   No results found for: INR No results found for: CHOL, HDL, LDLCALC, TRIG   GEN- The patient is well appearing, alert and oriented x 3 today.   Head- normocephalic, atraumatic Eyes-  Sclera clear, conjunctiva pink Ears- hearing intact Oropharynx- clear Neck- supple, no JVP Lymph- no cervical lymphadenopathy Lungs- Clear to ausculation bilaterally, normal work of breathing Heart- irregular rate and rhythm, no murmurs, rubs or gallops, PMI not laterally displaced GI- soft, NT, ND, + BS Extremities- no clubbing, cyanosis, or edema MS- no significant deformity or atrophy Skin- no rash or lesion Psych- euthymic mood, full affect Neuro- strength and sensation are intact  EKG-not done today, irregular on ascultation  Epic records reviewed Echo-Study Conclusions  - Left ventricle: The cavity size was normal. There was mild focal   basal hypertrophy of the septum. Systolic function was normal.   The estimated ejection fraction was in the range of 55% to 60%.   Wall motion was normal; there were no regional wall motion   abnormalities. The study was not technically sufficient to allow   evaluation of LV diastolic dysfunction due to atrial   fibrillation. - Aortic valve: There was mild regurgitation. - Aortic root: The aortic root wasmildly dilated measuring 42 mm. - Ascending aorta: The ascending aorta was normal in size. - Mitral valve: There was mild  regurgitation. - Left atrium: The atrium was moderately dilated.49 mm - Right ventricle: Systolic function was normal. - Right atrium: The atrium was moderately dilated. - Tricuspid valve: There was mild regurgitation. - Pulmonary arteries: Systolic pressure was mildly increased. PA   peak pressure: 32 mm Hg (S). - Inferior vena cava: The vessel was normal in size.    Assessment and Plan: 1. New onset afib, but date of onset unknown Successful cardioversion 11/26 and continues in SR Continue eliquis 5 mg bid, for chadsvasc score of 2   2. HTN May have been trigger for afib Continue losartan 50 mg a day BP 's are trending better at home, 120-130 range  Avoid salt    Encouraged to get established  with PCP Will refer to cardiology 2-3 months for afib/HTN/mildly dilated aortic root afib clinic as needed  Lupita LeashDonna C. Matthew Folksarroll, ANP-C Afib Clinic [facility] [address] [place], [place] [address] [phone]

## 2017-07-17 ENCOUNTER — Ambulatory Visit: Payer: Medicare Other | Admitting: Cardiology

## 2017-07-19 ENCOUNTER — Other Ambulatory Visit (HOSPITAL_COMMUNITY): Payer: Self-pay | Admitting: Nurse Practitioner

## 2017-08-03 ENCOUNTER — Ambulatory Visit: Payer: Medicare Other | Admitting: Internal Medicine

## 2017-08-03 ENCOUNTER — Encounter: Payer: Self-pay | Admitting: Internal Medicine

## 2017-08-03 VITALS — BP 162/100 | HR 62 | Ht 69.0 in | Wt 195.0 lb

## 2017-08-03 DIAGNOSIS — I481 Persistent atrial fibrillation: Secondary | ICD-10-CM

## 2017-08-03 DIAGNOSIS — I4819 Other persistent atrial fibrillation: Secondary | ICD-10-CM

## 2017-08-03 DIAGNOSIS — I10 Essential (primary) hypertension: Secondary | ICD-10-CM | POA: Diagnosis not present

## 2017-08-03 LAB — CBC
HEMATOCRIT: 40 % (ref 37.5–51.0)
HEMOGLOBIN: 14.2 g/dL (ref 13.0–17.7)
MCH: 29.3 pg (ref 26.6–33.0)
MCHC: 35.5 g/dL (ref 31.5–35.7)
MCV: 83 fL (ref 79–97)
Platelets: 169 10*3/uL (ref 150–379)
RBC: 4.84 x10E6/uL (ref 4.14–5.80)
RDW: 14.3 % (ref 12.3–15.4)
WBC: 4.2 10*3/uL (ref 3.4–10.8)

## 2017-08-03 LAB — BASIC METABOLIC PANEL
BUN/Creatinine Ratio: 27 — ABNORMAL HIGH (ref 10–24)
BUN: 29 mg/dL — AB (ref 8–27)
CALCIUM: 9.1 mg/dL (ref 8.6–10.2)
CHLORIDE: 102 mmol/L (ref 96–106)
CO2: 24 mmol/L (ref 20–29)
CREATININE: 1.08 mg/dL (ref 0.76–1.27)
GFR calc non Af Amer: 71 mL/min/{1.73_m2} (ref >59)
GFR, EST AFRICAN AMERICAN: 82 mL/min/{1.73_m2} (ref >59)
Glucose: 86 mg/dL (ref 65–99)
Potassium: 4.4 mmol/L (ref 3.5–5.2)
Sodium: 139 mmol/L (ref 134–144)

## 2017-08-03 MED ORDER — LOSARTAN POTASSIUM 50 MG PO TABS: 50.0000 mg | ORAL_TABLET | Freq: Every day | ORAL | 6 refills | Status: DC

## 2017-08-03 NOTE — Patient Instructions (Signed)
Medication Instructions:  Your physician has recommended you make the following change in your medication:  1.) INCREASE losartan to 50 two times a day   Labwork: Your physician recommends that you return for lab work today (BMET, CBC)   Testing/Procedures: none  Follow-Up: Your physician recommends that you schedule a follow-up appointment in: 6 WEEKS WITH PHYSICIAN EXTENDER (APP)   Any Other Special Instructions Will Be Listed Below (If Applicable).    If you need a refill on your cardiac medications before your next appointment, please call your pharmacy.

## 2017-08-03 NOTE — Progress Notes (Signed)
Cardiology Office Note   Date:  08/03/2017   ID:  Billy Harrell, DOB 1950/08/07, MRN [id number]  PCP:  Patient, No Pcp Per  Cardiologist:   Dietrich PatesPaula Anivea Velasques, MD   Pt presents for f/u of atrial fibrillaton     History of Present Illness: Billy Harrell is a 67 y.o. male with a history ofAtrial fibrillation I have not seen him  The pt was found to be in atrial fib in the fall  Sent frim primary care to ED  Started on Eliquis  Sent to afib clinic   Underwent cardioversion in Novmeber   Seen by Everlena Cooper Carroll in December  Still in SR  Since seen he denies palpitations   Says he is feeling good  Denies CP   Says energy is good  Works a Agricultural engineerlot        Current Meds  Medication Sig  . ELIQUIS 5 MG TABS tablet TAKE 1 TABLET BY MOUTH TWICE DAILY  . losartan (COZAAR) 50 MG tablet TAKE 1 TABLET BY MOUTH ONCE DAILY  . metoprolol tartrate (LOPRESSOR) 25 MG tablet TAKE 1 TABLET BY MOUTH TWICE DAILY  . Multiple Vitamin (MULTI-VITAMIN PO) Take 1 tablet by mouth daily.  . Omega-3 Fatty Acids (FISH OIL) 1000 MG CAPS Take 1,000 mg by mouth daily.  . Oxymetazoline HCl (NASAL SPRAY NA) Place 1 spray into the nose daily.      Allergies:   Patient has no known allergies.   Past Medical History:  Diagnosis Date  . A-fib (HCC)   . HTN (hypertension)     Past Surgical History:  Procedure Laterality Date  . CARDIOVERSION N/A 04/30/2017   Procedure: CARDIOVERSION;  Surgeon: Wendall StadeNishan, Peter C, MD;  [facility];  Service: Cardiovascular;  Laterality: N/A;     Social History:  The patient  reports that he has been smoking.  he has never used smokeless tobacco. He reports that he does not drink alcohol or use drugs.   Family History:  The patient's family history is not on file.    ROS:  Please see the history of present illness. All other systems are reviewed and  Negative to the above problem except as noted.    PHYSICAL EXAM: VS:  BP (!) 162/100   Pulse 62   Ht 5\' 9"  (1.753 m)   Wt 195 lb (88.5  kg)   BMI 28.80 kg/m   GEN: Well nourished, well developed, in no acute distress  HEENT: normal  Neck: no JVD, carotid bruits, or masses Cardiac: Irreg irreg  no murmurs, rubs, or gallops,no edema  Respiratory:  clear to auscultation bilaterally, normal work of breathing GI: soft, nontender, nondistended, + BS  No hepatomegaly  MS: no deformity Moving all extremities   Skin: warm and dry, no rash Neuro:  Strength and sensation are intact Psych: euthymic mood, full affect   EKG:  EKG is ordered today.  Atrial fibrillation .  62 bpm     Lipid Panel No results found for: CHOL, TRIG, HDL, CHOLHDL, VLDL, LDLCALC, LDLDIRECT    Wt Readings from Last 3 Encounters:  08/03/17 195 lb (88.5 kg)  05/07/17 192 lb 9.6 oz (87.4 kg)  04/30/17 193 lb (87.5 kg)      ASSESSMENT AND PLAN:  1  Atrial fibrillatin  CHADSVASC 2   Back in atrial fibrillation  I am not convinced that he notices any difference between SR and afib  He is active  I would keep him on same meds  CHeck CBC  2  HTN  BP is high  ON my check 152/88-92   Irreg rhythm makes difficult    I would increase Cozaar to 50 bid  Cotinue metoprolol  Discussed HTN and heart dz as wll as other organ effects    F/U in 6 wks       Current medicines are reviewed at length with the patient today.  The patient does not have concerns regarding medicines.  Signed, Dietrich Pates, MD  08/03/2017 9:18 AM    [facility] [address] [place], [place], [place]  [address] Phone: [phone]; Fax: [phone]

## 2017-08-20 ENCOUNTER — Other Ambulatory Visit (HOSPITAL_COMMUNITY): Payer: Self-pay | Admitting: *Deleted

## 2017-08-20 MED ORDER — LOSARTAN POTASSIUM 25 MG PO TABS: 50.0000 mg | ORAL_TABLET | Freq: Every day | ORAL | 3 refills | Status: DC

## 2017-08-27 ENCOUNTER — Telehealth: Payer: Self-pay | Admitting: Internal Medicine

## 2017-08-27 NOTE — Telephone Encounter (Signed)
Do not see that patient has had intolerance to ACEi. With recalls and backorders on ARBs would recommend change to lisinopril 20mg  daily. Monitor pressures for 3-4 weeks after change if able and call with changes/issues.

## 2017-08-27 NOTE — Telephone Encounter (Signed)
Will route to PharmD for recommendations.

## 2017-08-27 NOTE — Telephone Encounter (Signed)
New Message  Pt c/o medication issue:  1. Name of Medication: losartan (COZAAR) 25 MG tablet  2. How are you currently taking this medication (dosage and times per day)? Take 2 tablets (50 mg total) by mouth daily  3. Are you having a reaction (difficulty breathing--STAT)? no  4. What is your medication issue? Has recall on medication and was only given a prescription of 25 mg with 2 pills twice a day and he wants to make sure its ok

## 2017-08-30 MED ORDER — LISINOPRIL 20 MG PO TABS: 20.0000 mg | ORAL_TABLET | Freq: Every day | ORAL | 3 refills | Status: DC

## 2017-08-30 NOTE — Telephone Encounter (Signed)
Spoke to patient's daughter (DPR). Explained to change to lisinopril 20 mg once a day.  She wanted to know if pt should finish losartan bottle that he has. She will ask at the pharmacy to confirm whether that particular batch of losartan is part of recalled medicine.  If not, he will finish that bottle before starting lisinopril. Will monitor BP at home and knows to report and changes/issues with it.

## 2017-09-17 DIAGNOSIS — I10 Essential (primary) hypertension: Secondary | ICD-10-CM | POA: Insufficient documentation

## 2017-09-17 NOTE — Progress Notes (Signed)
Cardiology Office Note:    Date:  09/18/2017   ID:  Billy Harrell, DOB [birth year], MRN [id number]  PCP:  Daisy Floro, MD  Cardiologist:  Dietrich Pates, MD   Referring MD: No ref. provider found   Chief Complaint  Patient presents with  . Follow-up    Hypertension    History of Present Illness:    Billy Harrell is a 67 y.o. male with persistent atrial fibrillation s/p Cardioversion in 04/2017.  CHADS2-VASc=2 (HTN, age).   Last seen by Dr. Dietrich Pates in 3/19.  He was back in atrial fibrillation.  He was not felt to be symptomatic and therefore was managed with a rate control strategy.  Blood pressure was elevated and his ARB was adjusted.  Since then, this has been changed to lisinopril secondary to concerns over the ARB recall.  Mr. Hsiao returns for follow-up on blood pressure.  He is here today with his daughter.  He brings in a list of his blood pressure from home.  Blood pressures range 110-130 over 90s.  Overall, he is doing well.  He denies chest pain, shortness of breath, syncope, orthopnea, PND or significant pedal edema.  Prior CV studies:   The following studies were reviewed today:  Echo 04/06/17 Mild focal basal septal hypertrophy, EF 55-60, no RWMA, mild AI, mildly dilated Ao root (42 mm), mild MR, mod LAE, mod RAE, mild TR, PASP 32   Past Medical History:  Diagnosis Date  . A-fib (HCC)   . HTN (hypertension)    Surgical Hx: The patient  has a past surgical history that includes Cardioversion (N/A, 04/30/2017).   Current Medications: Current Meds  Medication Sig  . ELIQUIS 5 MG TABS tablet TAKE 1 TABLET BY MOUTH TWICE DAILY  . lisinopril (PRINIVIL,ZESTRIL) 20 MG tablet Take 1 tablet (20 mg total) by mouth daily.  . metoprolol tartrate (LOPRESSOR) 25 MG tablet TAKE 1 TABLET BY MOUTH TWICE DAILY  . Multiple Vitamin (MULTI-VITAMIN PO) Take 1 tablet by mouth daily.  . Omega-3 Fatty Acids (FISH OIL) 1000 MG CAPS Take 1,000 mg by mouth daily.  . Oxymetazoline HCl  (NASAL SPRAY NA) Place 1 spray into the nose daily.      Allergies:   Patient has no known allergies.   Social History   Tobacco Use  . Smoking status: Current Every Day Smoker  . Smokeless tobacco: Never Used  Substance Use Topics  . Alcohol use: No  . Drug use: No     Family Hx: The patient's family history is not on file.  ROS:   Please see the history of present illness.    ROS All other systems reviewed and are negative.   EKGs/Labs/Other Test Reviewed:    EKG:  EKG is  ordered today.  The ekg ordered today demonstrates atrial fibrillation, HR 56, no changes  Recent Labs: 03/26/2017: Magnesium 2.1 04/06/2017: TSH 1.674 08/03/2017: BUN 29; Creatinine, Ser 1.08; Hemoglobin 14.2; Platelets 169; Potassium 4.4; Sodium 139   Recent Lipid Panel No results found for: CHOL, TRIG, HDL, CHOLHDL, LDLCALC, LDLDIRECT  Physical Exam:    VS:  BP (!) 154/120   Pulse (!) 56   Ht 5\' 9"  (1.753 m)   Wt 192 lb (87.1 kg)   BMI 28.35 kg/m     Wt Readings from Last 3 Encounters:  09/18/17 192 lb (87.1 kg)  08/03/17 195 lb (88.5 kg)  05/07/17 192 lb 9.6 oz (87.4 kg)     Physical Exam  Constitutional: He is  oriented to person, place, and time. He appears well-developed and well-nourished. No distress.  HENT:  Head: Normocephalic and atraumatic.  Neck: No JVD present.  Cardiovascular: Normal rate. An irregularly irregular rhythm present.  No murmur heard. Pulmonary/Chest: He has no rales.  Abdominal: Soft.  Musculoskeletal: He exhibits no edema.  Neurological: He is alert and oriented to person, place, and time.  Skin: Skin is warm and dry.    ASSESSMENT & PLAN:    #1.  Essential hypertension He has significant elevations in blood pressure in clinic.  Blood pressures at home are much better.  Blood pressure is close to target.  I have suggested that he start HCTZ 12.5 mg daily.  Obtain follow-up BMET in 2 weeks.  I can see him back in 3 months for follow-up.  #2.   Persistent atrial fibrillation (HCC) Rate is controlled.  Continue Apixaban.  Dispo:  Return in about 3 months (around 12/18/2017) for Routine Follow Up, w/ Tereso NewcomerScott Karyme Mcconathy, PA-C.   Medication Adjustments/Labs and Tests Ordered: Current medicines are reviewed at length with the patient today.  Concerns regarding medicines are outlined above.  Tests Ordered: Orders Placed This Encounter  Procedures  . Basic Metabolic Panel (BMET)  . EKG 12-Lead   Medication Changes: Meds ordered this encounter  Medications  . hydrochlorothiazide (MICROZIDE) 12.5 MG capsule    Sig: Take 1 capsule (12.5 mg total) by mouth daily.    Dispense:  90 capsule    Refill:  3    Signed, Tereso NewcomerScott Juris Gosnell, PA-C  09/18/2017 5:54 PM    [facility] [address], [place], [place]  [address] Phone: [phone]; Fax: [phone]

## 2017-09-18 ENCOUNTER — Encounter (INDEPENDENT_AMBULATORY_CARE_PROVIDER_SITE_OTHER): Payer: Self-pay

## 2017-09-18 ENCOUNTER — Encounter: Payer: Self-pay | Admitting: Physician Assistant

## 2017-09-18 ENCOUNTER — Ambulatory Visit: Payer: Medicare Other | Admitting: Physician Assistant

## 2017-09-18 VITALS — BP 154/120 | HR 56 | Ht 69.0 in | Wt 192.0 lb

## 2017-09-18 DIAGNOSIS — I481 Persistent atrial fibrillation: Secondary | ICD-10-CM | POA: Diagnosis not present

## 2017-09-18 DIAGNOSIS — I4819 Other persistent atrial fibrillation: Secondary | ICD-10-CM

## 2017-09-18 DIAGNOSIS — I10 Essential (primary) hypertension: Secondary | ICD-10-CM | POA: Diagnosis not present

## 2017-09-18 MED ORDER — HYDROCHLOROTHIAZIDE 12.5 MG PO CAPS: 12.5000 mg | ORAL_CAPSULE | Freq: Every day | ORAL | 3 refills | Status: DC

## 2017-09-18 NOTE — Patient Instructions (Signed)
Medication Instructions:  1. START HCTZ 12.5 MG DAILY; RX HAS BEEN SENT IN  Labwork: 1. BMET TO BE DONE IN 2 WEEKS   Testing/Procedures: NONE ORDERED TODAY  Follow-Up: SCOTT WEAVER, PAC 2-3 MONTHS   Any Other Special Instructions Will Be Listed Below (If Applicable). CALL IF BLOOD PRESSURE IS CONSISTENTLY 140/90 OR HIGHER     If you need a refill on your cardiac medications before your next appointment, please call your pharmacy.

## 2017-10-02 ENCOUNTER — Other Ambulatory Visit: Payer: Medicare Other

## 2017-10-02 ENCOUNTER — Telehealth: Payer: Self-pay | Admitting: *Deleted

## 2017-10-02 DIAGNOSIS — I10 Essential (primary) hypertension: Secondary | ICD-10-CM

## 2017-10-02 LAB — BASIC METABOLIC PANEL
BUN / CREAT RATIO: 24 (ref 10–24)
BUN: 36 mg/dL — AB (ref 8–27)
CHLORIDE: 101 mmol/L (ref 96–106)
CO2: 25 mmol/L (ref 20–29)
Calcium: 8.9 mg/dL (ref 8.6–10.2)
Creatinine, Ser: 1.51 mg/dL — ABNORMAL HIGH (ref 0.76–1.27)
GFR calc Af Amer: 54 mL/min/{1.73_m2} — ABNORMAL LOW (ref >59)
GFR calc non Af Amer: 47 mL/min/{1.73_m2} — ABNORMAL LOW (ref >59)
GLUCOSE: 93 mg/dL (ref 65–99)
Potassium: 4.1 mmol/L (ref 3.5–5.2)
Sodium: 140 mmol/L (ref 134–144)

## 2017-10-02 NOTE — Telephone Encounter (Signed)
lmptcb to go over lab results and medication changes.

## 2017-10-02 NOTE — Telephone Encounter (Signed)
-----   Message from Beatrice Lecher, PA-C sent at 10/02/2017  5:06 PM EDT ----- Creatinine increased, potassium normal. PLAN:  1.  Stop hydrochlorothiazide 2.  Hold lisinopril for 1 day, then resume 3.  Start amlodipine 2.5 mg daily 4.  Call if blood pressure consistently greater than 130/80 5.  BMET 1 week Tereso Newcomer, PA-C    10/02/2017 5:05 PM

## 2017-10-03 MED ORDER — AMLODIPINE BESYLATE 2.5 MG PO TABS: 2.5000 mg | ORAL_TABLET | Freq: Every day | ORAL | 3 refills | Status: DC

## 2017-10-03 NOTE — Telephone Encounter (Signed)
DPR ok to s/w pt's daughter who has been advised of pt's lab results and med changes.  PLAN:  1. Stop hydrochlorothiazide  2. Hold lisinopril for 1 day, Hold Thursday 10/04/17; then resume on Friday May 10/05/17 3. Start amlodipine 2.5 mg daily ; Rx has been sent in 4. Call if blood pressure consistently greater than 130/80  5. BMET 1 week 10/11/17  Pt's daughter thanked me for the call. Pt's daughter gave verbal read back x 1 on medication changes.

## 2017-10-03 NOTE — Telephone Encounter (Signed)
-----   Message from Scott T Weaver, PA-C sent at 10/02/2017  5:06 PM EDT ----- Creatinine increased, potassium normal. PLAN:  1.  Stop hydrochlorothiazide 2.  Hold lisinopril for 1 day, then resume 3.  Start amlodipine 2.5 mg daily 4.  Call if blood pressure consistently greater than 130/80 5.  BMET 1 week Scott Weaver, PA-C    10/02/2017 5:05 PM 

## 2017-10-03 NOTE — Telephone Encounter (Signed)
Pts daughter returning your call

## 2017-10-03 NOTE — Telephone Encounter (Signed)
lmptcb x 2 to go over results 

## 2017-10-11 ENCOUNTER — Other Ambulatory Visit: Payer: Medicare Other

## 2017-10-11 ENCOUNTER — Other Ambulatory Visit: Payer: Self-pay

## 2017-10-11 DIAGNOSIS — I10 Essential (primary) hypertension: Secondary | ICD-10-CM

## 2017-10-11 LAB — BASIC METABOLIC PANEL
BUN/Creatinine Ratio: 16 (ref 10–24)
BUN: 26 mg/dL (ref 8–27)
CO2: 21 mmol/L (ref 20–29)
CREATININE: 1.58 mg/dL — AB (ref 0.76–1.27)
Calcium: 8.7 mg/dL (ref 8.6–10.2)
Chloride: 104 mmol/L (ref 96–106)
GFR calc Af Amer: 52 mL/min/{1.73_m2} — ABNORMAL LOW (ref >59)
GFR, EST NON AFRICAN AMERICAN: 45 mL/min/{1.73_m2} — AB (ref >59)
Glucose: 90 mg/dL (ref 65–99)
POTASSIUM: 4.8 mmol/L (ref 3.5–5.2)
Sodium: 136 mmol/L (ref 134–144)

## 2017-10-11 MED ORDER — AMLODIPINE BESYLATE 5 MG PO TABS: 5.0000 mg | ORAL_TABLET | Freq: Every day | ORAL | 3 refills | Status: DC

## 2017-10-25 ENCOUNTER — Other Ambulatory Visit: Payer: Medicare Other | Admitting: *Deleted

## 2017-10-25 ENCOUNTER — Encounter (INDEPENDENT_AMBULATORY_CARE_PROVIDER_SITE_OTHER): Payer: Self-pay

## 2017-10-25 DIAGNOSIS — I10 Essential (primary) hypertension: Secondary | ICD-10-CM

## 2017-10-25 LAB — BASIC METABOLIC PANEL
BUN/Creatinine Ratio: 25 — ABNORMAL HIGH (ref 10–24)
BUN: 38 mg/dL — ABNORMAL HIGH (ref 8–27)
CO2: 22 mmol/L (ref 20–29)
Calcium: 8.8 mg/dL (ref 8.6–10.2)
Chloride: 103 mmol/L (ref 96–106)
Creatinine, Ser: 1.52 mg/dL — ABNORMAL HIGH (ref 0.76–1.27)
GFR, EST AFRICAN AMERICAN: 54 mL/min/{1.73_m2} — AB (ref >59)
GFR, EST NON AFRICAN AMERICAN: 47 mL/min/{1.73_m2} — AB (ref >59)
Glucose: 100 mg/dL — ABNORMAL HIGH (ref 65–99)
POTASSIUM: 4.6 mmol/L (ref 3.5–5.2)
SODIUM: 140 mmol/L (ref 134–144)

## 2017-10-26 ENCOUNTER — Telehealth: Payer: Self-pay | Admitting: Physician Assistant

## 2017-10-26 ENCOUNTER — Telehealth: Payer: Self-pay | Admitting: *Deleted

## 2017-10-26 DIAGNOSIS — I10 Essential (primary) hypertension: Secondary | ICD-10-CM

## 2017-10-26 NOTE — Telephone Encounter (Signed)
New Message    Pt c/o medication issue:  1. Name of Medication: Eliquis  2. How are you currently taking this medication (dosage and times per day)?   3. Are you having a reaction (difficulty breathing--STAT)?   4. What is your medication issue? Patients daughter is calling to confirm how many times a day her father should be taking Eliquis.

## 2017-10-26 NOTE — Telephone Encounter (Signed)
-----   Message from Beatrice Lecher, [place] sent at 10/26/2017  1:21 PM EDT ----- Creatinine remains elevated.  I did talk with Dr. Tenny Craw 2 weeks ago when we stopped his Lisinopril.  We had considered stopping his Amlodipine due to the rare chance that Amlodipine can cause worsening Creatinine.  But we wanted to see how his renal function responded to stopping his Lisinopril. Since it is not improving, we should stop Amlodipine as well.  Since his blood pressure has been difficult to control, we also need to assess for renal artery stenosis.  Changing his beta-blocker to a non-selective beta-blocker may help his blood pressure more. PLAN:  1. Stop Amlodipine 2. Stop Metoprolol 3. Start Carvedilol 6.25 mg Twice daily  4. Schedule renal artery [place] to r/o renal artery stenosis. 5. FU BMET 2 weeks. 6. HTN Clinic visit in 2 weeks to recheck blood pressure  7. Keep follow up with me in July Scott Weaver, PA-C    10/26/2017 1:15 PM

## 2017-10-26 NOTE — Telephone Encounter (Signed)
Ok. Tereso Newcomer, PA-C    10/26/2017 3:40 PM

## 2017-10-26 NOTE — Telephone Encounter (Signed)
Pt's daughter Jearld Adjutant [facility]) asked if pt is supposed to take Eliquis BID or QD, she states pt's bottle says to take 1 time a day. I advised pt he should be taking Eliquis twice daily. I reviewed pt's chart to confirm medication is listed in chart accurately, which it is. Pt's daughter thanked me for the call back and the help.

## 2017-10-26 NOTE — Telephone Encounter (Signed)
DPR ok to s/w pt's daughter Jearld Adjutant who has been notified of lab results and changes in medications as well as testing to be ordered. I carefully went over medication changes: STOP Amlodipine, STOP Metoprolol Tart, START Coreg 6.25 mg BID (Rx, has been sent in), Renal artery U/S, BMET, HTN clinc in 2 weeks. Pt's daughter did ask if U/S, BMET, HTN clinic could be the same day. I advised I will let the scheduler know. Advised [facility] will call with appt. I also advised if pt has any problems over the weekend since we are changing BP meds, to please call the office # [phone] and s/w the On-Call Provider for recommendations. Pt's daughter gave verbal read x2 to new plan of care for the pt. She did also ask if ok to take allergy medication like Zyrtec, Claritin. I advised that would be fine though to not take anything with D as this will raise BP and HR. Pt's daughter thanked me for the call and me help today.

## 2017-11-01 ENCOUNTER — Telehealth: Payer: Self-pay | Admitting: Physician Assistant

## 2017-11-01 MED ORDER — CARVEDILOL 6.25 MG PO TABS: 6.2500 mg | ORAL_TABLET | Freq: Two times a day (BID) | ORAL | 3 refills | Status: DC

## 2017-11-01 NOTE — Telephone Encounter (Signed)
I returned call to the pt and his daughter. Lmom. Apologize Rx did not go through, though I did resend again this morning. Pt was started on Coreg 6.25 mg BID per Bing Neighbors. PA on 10/26/17. Lmom if any questions please call [phone].

## 2017-11-01 NOTE — Telephone Encounter (Signed)
New Message:       Pt's daughter is calling and states he was told that a new prescription would be sent in for him but it has not been sent to the pharmacy yet. Pt's daughter is unsure as of to what the new medication id called

## 2017-11-05 ENCOUNTER — Ambulatory Visit (HOSPITAL_COMMUNITY)
Admission: RE | Admit: 2017-11-05 | Discharge: 2017-11-05 | Disposition: A | Discharge status: Home / Self Care | Payer: Medicare Other | Source: Ambulatory Visit | Attending: Internal Medicine | Admitting: Internal Medicine

## 2017-11-05 DIAGNOSIS — I10 Essential (primary) hypertension: Secondary | ICD-10-CM

## 2017-11-07 ENCOUNTER — Ambulatory Visit (HOSPITAL_COMMUNITY)
Admission: RE | Admit: 2017-11-07 | Discharge: 2017-11-07 | Disposition: A | Discharge status: Home / Self Care | Payer: Medicare Other | Source: Ambulatory Visit | Attending: Cardiology | Admitting: Cardiology

## 2017-11-07 DIAGNOSIS — I10 Essential (primary) hypertension: Secondary | ICD-10-CM | POA: Diagnosis present

## 2017-11-09 ENCOUNTER — Encounter: Payer: Self-pay | Admitting: Physician Assistant

## 2017-11-09 ENCOUNTER — Telehealth: Payer: Self-pay | Admitting: *Deleted

## 2017-11-09 NOTE — Telephone Encounter (Signed)
DPR ok to s/w pt's daughter Billy Harrell who has been notified of Renal U/S results for the pt. I will fax a copy to PCP as well. She does state pt states he has some pain around the kidney area and burning when he urinates. I advised for her to f/u with PCP or Urology. Pt's daughter is agreeable to plan of care for the pt.

## 2017-11-09 NOTE — Telephone Encounter (Signed)
Left message to go over renal u/s results.

## 2017-11-09 NOTE — Telephone Encounter (Signed)
-----   Message from Beatrice LecherScott T Weaver, [place] sent at 11/09/2017  7:02 AM EDT ----- Please call the patient. The ultrasound is ok.  There is no stenosis of either renal artery. Continue current medications and follow up as planned.  Please fax a copy to PCP:  Daisy Florooss, Charles Alan, MD  Tereso NewcomerScott Weaver, PA-C    11/09/2017 7:01 AM

## 2017-11-09 NOTE — Telephone Encounter (Signed)
-----   Message from Scott T Weaver, PA-C sent at 11/09/2017  7:02 AM EDT ----- Please call the patient. The ultrasound is ok.  There is no stenosis of either renal artery. Continue current medications and follow up as planned.  Please fax a copy to PCP:  Ross, Charles Alan, MD  Scott Weaver, PA-C    11/09/2017 7:01 AM 

## 2017-11-14 ENCOUNTER — Ambulatory Visit (INDEPENDENT_AMBULATORY_CARE_PROVIDER_SITE_OTHER): Payer: Medicare Other | Admitting: Pharmacist

## 2017-11-14 ENCOUNTER — Other Ambulatory Visit: Payer: Medicare Other | Admitting: *Deleted

## 2017-11-14 ENCOUNTER — Encounter: Payer: Self-pay | Admitting: Pharmacist

## 2017-11-14 VITALS — BP 134/78 | HR 60

## 2017-11-14 DIAGNOSIS — I10 Essential (primary) hypertension: Secondary | ICD-10-CM | POA: Diagnosis not present

## 2017-11-14 LAB — BASIC METABOLIC PANEL WITH GFR
BUN/Creatinine Ratio: 24 (ref 10–24)
BUN: 28 mg/dL — ABNORMAL HIGH (ref 8–27)
CO2: 23 mmol/L (ref 20–29)
Calcium: 8.9 mg/dL (ref 8.6–10.2)
Chloride: 105 mmol/L (ref 96–106)
Creatinine, Ser: 1.19 mg/dL (ref 0.76–1.27)
GFR calc Af Amer: 73 mL/min/1.73
GFR calc non Af Amer: 63 mL/min/1.73
Glucose: 87 mg/dL (ref 65–99)
Potassium: 4.2 mmol/L (ref 3.5–5.2)
Sodium: 140 mmol/L (ref 134–144)

## 2017-11-14 NOTE — Progress Notes (Signed)
Patient ID: Billy Harrell                 DOB: February 10, 1951                      MRN: [id number]     HPI: Billy Harrell is a 67 y.o. male patient of Dr. Tenny Craw who presents today for hypertension evaluation. PMH significant for persistent atrial fibrillation s/p Cardioversion in 04/2017.  CHADS2-VASc=2 (HTN, age). He has had several medication changes recently due to worsening kidney function. Mostly recently his amlodipine was stopped and he was started on carvedilol 6.25mg  BID about 1 week ago.   He presents today with his daughter and grandson (79mo). This is his first grandchild. He states that he had a small amount of pain in his kidneys (his lower back bilateral) a few days ago, but this has improved. Advised that he monitor this and call with any additional pain. He denies dizziness, chest pain, SOB. He believes that his pressure was more controlled on his previous medication, but we discussed that with starting a week ago pressures should regulate over next few days - weeks and if not he should call.    Current HTN meds:  Carvedilol 6.25mg  BID (10am and 10pm)  Previously tried: HCTZ/lisinopril - increased Scr  BP goal: <130/80  Family History: unknown  Social History: current smoker, denies tobacco products  Diet: Most meals prepared from home. He has been trying to watch salt but does cook with seasonings. He drinks mostly pink lemonade. He does not drink coffee or tea or soda.   Exercise: He walks every day. He gardens a lot as well.   Home BP readings: 90-137/62-92 HR 70s mostly 110s/70s  Wt Readings from Last 3 Encounters:  09/18/17 192 lb (87.1 kg)  08/03/17 195 lb (88.5 kg)  05/07/17 192 lb 9.6 oz (87.4 kg)   BP Readings from Last 3 Encounters:  11/14/17 134/78  09/18/17 (!) 154/120  08/03/17 (!) 162/100   Pulse Readings from Last 3 Encounters:  11/14/17 60  09/18/17 (!) 56  08/03/17 62    Renal function: CrCl cannot be calculated (Unknown ideal weight.).  Past  Medical History:  Diagnosis Date  . A-fib (HCC)   . HTN (hypertension)    [place] 6/19:  neg for RA stenosis bilat; normal mesenteric arteries    Current Outpatient Medications on File Prior to Visit  Medication Sig Dispense Refill  . carvedilol (COREG) 6.25 MG tablet Take 1 tablet (6.25 mg total) by mouth 2 (two) times daily. 180 tablet 3  . ELIQUIS 5 MG TABS tablet TAKE 1 TABLET BY MOUTH TWICE DAILY 60 tablet 2  . Multiple Vitamin (MULTI-VITAMIN PO) Take 1 tablet by mouth daily.    . Omega-3 Fatty Acids (FISH OIL) 1000 MG CAPS Take 1,000 mg by mouth daily.    . Oxymetazoline HCl (NASAL SPRAY NA) Place 1 spray into the nose daily.      No current facility-administered medications on file prior to visit.     No Known Allergies  Blood pressure 134/78, pulse 60, SpO2 96 %.   Assessment/Plan: Hypertension: BMET today. BP today is borderline at goal. Home pressures are mostly at goal and some even borderline low. Will continue current regimen of carvedilol 6.25mg  BID. Advised he continue to monitor pressures and call with concerns.    Thank you, Freddie Apley. Cleatis Polka, PharmD  [facility]  11/14/2017 10:40 AM  ADDENDUM: BMET returned with  improve kidney function now off ACEi/HCTZ. Will continue as above.

## 2017-11-14 NOTE — Patient Instructions (Signed)
Return for a follow up appointment as scheduled  Your blood pressure goal is less than 130/80  Check your blood pressure at home daily (if able) and keep record of the readings.  Take your BP meds as follows: CONTINUE carvedilol 6.25mg  TWICE DAILY  Bring all of your meds, your BP cuff and your record of home blood pressures to your next appointment.  Exercise as you're able, try to walk approximately 30 minutes per day.  Keep salt intake to a minimum, especially watch canned and prepared boxed foods.  Eat more fresh fruits and vegetables and fewer canned items.  Avoid eating in fast food restaurants.    HOW TO TAKE YOUR BLOOD PRESSURE: . Rest 5 minutes before taking your blood pressure. .  Don't smoke or drink caffeinated beverages for at least 30 minutes before. . Take your blood pressure before (not after) you eat. . Sit comfortably with your back supported and both feet on the floor (don't cross your legs). . Elevate your arm to heart level on a table or a desk. . Use the proper sized cuff. It should fit smoothly and snugly around your bare upper arm. There should be enough room to slip a fingertip under the cuff. The bottom edge of the cuff should be 1 inch above the crease of the elbow. . Ideally, take 3 measurements at one sitting and record the average.

## 2017-11-15 ENCOUNTER — Telehealth: Payer: Self-pay | Admitting: *Deleted

## 2017-11-15 NOTE — Telephone Encounter (Signed)
Left message to go over lab results. [facility] Continue on current Tx plan. Call [phone] if any questions .

## 2017-11-15 NOTE — Telephone Encounter (Signed)
-----   Message from Beatrice LecherScott T Weaver, [place]-C sent at 11/15/2017  9:35 AM EDT ----- Renal function improved - now normal.  The potassium is normal. Continue current medications and follow up as planned.  Tereso NewcomerScott Weaver, PA-C    11/15/2017 9:34 AM

## 2017-11-20 NOTE — Telephone Encounter (Signed)
-----   Message from Scott T Weaver, PA-C sent at 11/15/2017  9:35 AM EDT ----- Renal function improved - now normal.  The potassium is normal. Continue current medications and follow up as planned.  Scott Weaver, PA-C    11/15/2017 9:34 AM 

## 2017-11-20 NOTE — Telephone Encounter (Signed)
DPR ok to s/w pt's daughter Jearld AdjutantMarina who has been notified of pt's lab results by phone with verbal understanding. Pt's daughter thanked me for the call.

## 2017-12-10 ENCOUNTER — Other Ambulatory Visit (HOSPITAL_COMMUNITY): Payer: Self-pay | Admitting: Nurse Practitioner

## 2017-12-17 DIAGNOSIS — N179 Acute kidney failure, unspecified: Secondary | ICD-10-CM | POA: Insufficient documentation

## 2017-12-17 NOTE — Progress Notes (Signed)
Cardiology Office Note:    Date:  12/18/2017   ID:  Billy Harrell, DOB 1951/04/12, MRN [id number]  PCP:  Daisy Floro, MD  Cardiologist:  Dietrich Pates, MD    Referring MD: Daisy Floro, MD   Chief Complaint  Patient presents with  . Follow-up    HTN    History of Present Illness:    Billy Harrell is a 67 y.o. male with hypertension, persistent atrial fibrillation s/p Cardioversion in 04/2017.  CHADS2-VASc=2 (HTN, age).  He is managed with a rate control strategy.  Last seen 4/19 for uncontrolled blood pressure.  His HCTZ and ACE inhibitor were stopped due to worsening renal function.  His elevated Creatinine persisted on Amlodipine and this was eventually stopped.  I changed his Metoprolol to Carvedilol and he has been followed in the HTN Clinic.  His Creatinine improved (1.52 >> 1.19) with medication adjustments.  BP was improved in the HTN Clinic and no further adjustments were made.    Mr. Billy Harrell returns for follow up on blood pressure.  He is here with his daughter and grandchild.  He is doing well.  He denies chest pain, shortness of breath, syncope, dizziness.  He denies any bleeding issues.  He brings in a record of his BP.  His BPs at home are all optimal.     Prior CV studies:   The following studies were reviewed today:  Renal Artery [place] 11/07/17 No RAS bilat; normal celiac and SMA  Echo 04/06/17 Mild focal basal septal hypertrophy, EF 55-60, no RWMA, mild AI, mildly dilated Ao root (42 mm), mild MR, mod LAE, mod RAE, mild TR, PASP 32  Past Medical History:  Diagnosis Date  . A-fib (HCC)   . HTN (hypertension)    [place] 6/19:  neg for RA stenosis bilat; normal mesenteric arteries   Surgical Hx: The patient  has a past surgical history that includes Cardioversion (N/A, 04/30/2017).   Current Medications: Current Meds  Medication Sig  . apixaban (ELIQUIS) 5 MG TABS tablet Take 5 mg by mouth 2 (two) times daily.  . carvedilol (COREG) 6.25 MG tablet Take 1 tablet  (6.25 mg total) by mouth 2 (two) times daily.  . Multiple Vitamin (MULTI-VITAMIN PO) Take 1 tablet by mouth daily.  . Omega-3 Fatty Acids (FISH OIL) 1000 MG CAPS Take 1,000 mg by mouth daily.  . Oxymetazoline HCl (NASAL SPRAY NA) Place 1 spray into the nose daily.   . [DISCONTINUED] ELIQUIS 5 MG TABS tablet TAKE 1 TABLET BY MOUTH ONCE DAILY     Allergies:   Patient has no known allergies.   Social History   Tobacco Use  . Smoking status: Never Smoker  . Smokeless tobacco: Never Used  Substance Use Topics  . Alcohol use: No  . Drug use: No     Family Hx: The patient's family history is not on file.  ROS:   Please see the history of present illness.    Review of Systems  Cardiovascular: Positive for irregular heartbeat.  Gastrointestinal: Positive for diarrhea.   All other systems reviewed and are negative.   EKGs/Labs/Other Test Reviewed:    EKG:  EKG is not ordered today.    Recent Labs: 03/26/2017: Magnesium 2.1 04/06/2017: TSH 1.674 08/03/2017: Hemoglobin 14.2; Platelets 169 11/14/2017: BUN 28; Creatinine, Ser 1.19; Potassium 4.2; Sodium 140   Recent Lipid Panel No results found for: CHOL, TRIG, HDL, CHOLHDL, LDLCALC, LDLDIRECT  Physical Exam:    VS:  BP (!) 144/102  Pulse 78   Ht 5\' 9"  (1.753 m)   Wt 186 lb 3.2 oz (84.5 kg)   SpO2 93%   BMI 27.50 kg/m     Wt Readings from Last 3 Encounters:  12/18/17 186 lb 3.2 oz (84.5 kg)  09/18/17 192 lb (87.1 kg)  08/03/17 195 lb (88.5 kg)     Physical Exam  Constitutional: He is oriented to person, place, and time. He appears well-developed and well-nourished. No distress.  HENT:  Head: Normocephalic and atraumatic.  Neck: Neck supple. No JVD present.  Cardiovascular: Normal rate, S1 normal and S2 normal. An irregularly irregular rhythm present.  No murmur heard. Pulmonary/Chest: Breath sounds normal. He has no rales.  Abdominal: Soft. There is no hepatomegaly.  Musculoskeletal: He exhibits no edema.    Neurological: He is alert and oriented to person, place, and time.  Skin: Skin is warm and dry.  Psychiatric: He has a normal mood and affect.    ASSESSMENT & PLAN:    Essential hypertension The patient's blood pressure is controlled on his current regimen.  Continue current therapy.  His Creatinine increased on ACE inhibitor, thiazide diuretic and Amlodipine.  His most recent Creatinine on Carvedilol is normal.  Renal artery US was negative for RAS.    Persistent atrial fibrillation (HCC)   His Rx for Eliquis was changed to once daily.  I advised him that this should be taken Twice daily.  We will call his pharmacy to correct this.  Continue Carvedilol and Eliquis.  Hemoglobin in 3/19 was normal.  His Creatinine recently was normal.   Dispo:  Return in about 6 months (around 06/20/2018) for Routine Follow Up, w/ Dr. Tenny Crawoss, or Tereso NewcomerScott Weaver, PA-C.   Medication Adjustments/Labs and Tests Ordered: Current medicines are reviewed at length with the patient today.  Concerns regarding medicines are outlined above.  Tests Ordered: No orders of the defined types were placed in this encounter.  Medication Changes: No orders of the defined types were placed in this encounter.   Signed, Tereso NewcomerScott Weaver, PA-C  12/18/2017 9:19 AM    [facility] [address], [place], [place]  [address] Phone: [phone]; Fax: [phone]

## 2017-12-18 ENCOUNTER — Encounter: Payer: Self-pay | Admitting: Physician Assistant

## 2017-12-18 ENCOUNTER — Encounter (INDEPENDENT_AMBULATORY_CARE_PROVIDER_SITE_OTHER): Payer: Self-pay

## 2017-12-18 ENCOUNTER — Ambulatory Visit: Payer: Medicare Other | Admitting: Physician Assistant

## 2017-12-18 VITALS — BP 144/102 | HR 78 | Ht 69.0 in | Wt 186.2 lb

## 2017-12-18 DIAGNOSIS — I481 Persistent atrial fibrillation: Secondary | ICD-10-CM | POA: Diagnosis not present

## 2017-12-18 DIAGNOSIS — I4819 Other persistent atrial fibrillation: Secondary | ICD-10-CM

## 2017-12-18 DIAGNOSIS — I10 Essential (primary) hypertension: Secondary | ICD-10-CM

## 2017-12-18 NOTE — Patient Instructions (Signed)
Medication Instructions You should be taking your Eliquis twice a day   Labwork: None Ordered  Procedures/Testing: None Ordered  Follow-Up: Your physician recommends that you schedule a follow-up appointment in: 6 months with Dr. Tenny Crawoss or Tereso NewcomerScott Weaver PA-C    Any Additional Special Instructions Will Be Listed Below (If Applicable).     If you need a refill on your cardiac medications before your next appointment, please call your pharmacy.

## 2018-03-20 ENCOUNTER — Telehealth: Payer: Self-pay | Admitting: Physician Assistant

## 2018-03-20 NOTE — Telephone Encounter (Signed)
**Note De-Identified Su Duma Obfuscation** I called the pts daughter Jearld Adjutant) back.  She states that the pt has gone into the donut hole and cannot afford his Eliquis at the current cost.  I asked her if the pt would be interested in applying for pt asst through [employer] (rather than changing to a different medication) and that if he is approved he will receive his Eliquis from the manufacturer free of charge until the end of the year and she stated that he would be interested.  I have advised her that I am leaving the pt 2 bottles of Eliquis samples and a BMS pt asst application in the front office for him to pick up. She states that either she or the pt will come by the office today or tomorrow to pick up his samples and the application.

## 2018-03-20 NOTE — Telephone Encounter (Signed)
S/w Larita Fife Via, LPN in our Prior Auth prgm and asked if she would be able to s/w pt in regards to possible assistance prgm. Larita Fife agrees to call the pt to discuss further. I will route this message to [facility] Via, LPN.

## 2018-03-20 NOTE — Telephone Encounter (Signed)
  Pt c/o medication issue:  1. Name of Medication: apixaban (ELIQUIS) 5 MG TABS tablet  2. How are you currently taking this medication (dosage and times per day)? Take 5 mg by mouth 2 (two) times daily.  3. Are you having a reaction (difficulty breathing--STAT)?  No  4. What is your medication issue?  Pt was informed that insurance will not pay as much from here on and he will not be able to afford it. Is there something else he can take that is cheaper?

## 2018-08-02 ENCOUNTER — Ambulatory Visit: Payer: Medicare Other | Admitting: Physician Assistant

## 2018-08-02 ENCOUNTER — Encounter: Payer: Self-pay | Admitting: Physician Assistant

## 2018-08-02 VITALS — BP 140/80 | HR 55 | Ht 69.0 in | Wt 179.8 lb

## 2018-08-02 DIAGNOSIS — I4819 Other persistent atrial fibrillation: Secondary | ICD-10-CM

## 2018-08-02 DIAGNOSIS — I10 Essential (primary) hypertension: Secondary | ICD-10-CM | POA: Diagnosis not present

## 2018-08-02 MED ORDER — AMLODIPINE BESYLATE 2.5 MG PO TABS: 2.5000 mg | ORAL_TABLET | Freq: Every day | ORAL | 11 refills | Status: DC

## 2018-08-02 NOTE — Patient Instructions (Signed)
Medication Instructions:  Your physician has recommended you make the following change in your medication:   1. START AMLODIPINE 2.5 MG DAILY.  If you need a refill on your cardiac medications before your next appointment, please call your pharmacy.   Lab work: TO BE DONE IN 2 WEEKS: BMET, CBC  If you have labs (blood work) drawn today and your tests are completely normal, you will receive your results only by: Marland Kitchen MyChart Message (if you have MyChart) OR . A paper copy in the mail If you have any lab test that is abnormal or we need to change your treatment, we will call you to review the results.  Testing/Procedures: NONE  Follow-Up: At [facility], you and your health needs are our priority.  As part of our continuing mission to provide you with exceptional heart care, we have created designated Provider Care Teams.  These Care Teams include your primary Cardiologist (physician) and Advanced Practice Providers (APPs -  Physician Assistants and Nurse Practitioners) who all work together to provide you with the care you need, when you need it. You will need a follow up appointment in:  6 months.  Please call our office 2 months in advance to schedule this appointment.  You may see Dietrich Pates, MD or one of the following Advanced Practice Providers on your designated Care Team: Tereso Newcomer, PA-C Vin [place], [place] . Berton Bon, NP  Any Other Special Instructions Will Be Listed Below (If Applicable). PLEASE CALL OUR OFFICE OF YOUR BLOOD PRESSURE IS GREATER THAN 140/90

## 2018-08-02 NOTE — Progress Notes (Signed)
Cardiology Office Note:    Date:  08/02/2018   ID:  Billy Harrell, DOB 04-Aug-1950, MRN [id number]  PCP:  Billy Floro, MD  Cardiologist:  Dietrich Pates, MD   Electrophysiologist:  None   Referring MD: Billy Floro, MD   Chief Complaint  Patient presents with  . Follow-up    AF, HTN     History of Present Illness:    Billy Harrell is a 68 y.o. male with  hypertension, persistentatrial fibrillations/p Cardioversion in 04/2017.CHADS2-VASc=2 (HTN, age).  He is managed with a rate control strategy.  He has been taken off of ACE inhibitor, hydrochlorothiazide and amlodipine due to worsening renal function.  His blood pressure has previously been managed in the hypertension clinic.  Creatinine did improve off of the above medications.  Renal artery ultrasound was negative for renal artery stenosis.   [name] returns for follow up.  He is here with his daughter.  He has not had any chest pain, shortness of breath, syncope, paroxysmal nocturnal dyspnea, leg swelling. He does have some numbness and tingling in his legs.  His BP recently has been 140s/90s at home.    Prior CV studies:   The following studies were reviewed today:  Renal Artery [place] 11/07/17 No RAS bilat; normal celiac and SMA  Echo 04/06/17 Mild focal basal septal hypertrophy, EF 55-60, no RWMA, mild AI, mildly dilated Ao root (42 mm), mild MR, mod LAE, mod RAE, mild TR, PASP 32  Past Medical History:  Diagnosis Date  . A-fib (HCC)   . HTN (hypertension)    [place] 6/19:  neg for RA stenosis bilat; normal mesenteric arteries   Surgical Hx: The patient  has a past surgical history that includes Cardioversion (N/A, 04/30/2017).   Current Medications: Current Meds  Medication Sig  . apixaban (ELIQUIS) 5 MG TABS tablet Take 5 mg by mouth 2 (two) times daily.  . carvedilol (COREG) 6.25 MG tablet Take 1 tablet (6.25 mg total) by mouth 2 (two) times daily.  . Multiple Vitamin (MULTI-VITAMIN PO) Take 1 tablet by  mouth daily.  . Omega-3 Fatty Acids (FISH OIL) 1000 MG CAPS Take 1,000 mg by mouth daily.  . Oxymetazoline HCl (NASAL SPRAY NA) Place 1 spray into the nose daily.      Allergies:   Patient has no known allergies.   Social History   Tobacco Use  . Smoking status: Never Smoker  . Smokeless tobacco: Never Used  Substance Use Topics  . Alcohol use: No  . Drug use: No     Family Hx: The patient's family history is not on file.  ROS:   Please see the history of present illness.    Review of Systems  Cardiovascular: Positive for irregular heartbeat.  Skin: Positive for rash.  Musculoskeletal: Positive for joint pain.   All other systems reviewed and are negative.   EKGs/Labs/Other Test Reviewed:    EKG:  EKG is  ordered today.  The ekg ordered today demonstrates atrial fibrillation, HR 55  Recent Labs: 08/03/2017: Hemoglobin 14.2; Platelets 169 11/14/2017: BUN 28; Creatinine, Ser 1.19; Potassium 4.2; Sodium 140   Recent Lipid Panel No results found for: CHOL, TRIG, HDL, CHOLHDL, LDLCALC, LDLDIRECT  Physical Exam:    VS:  BP 140/80   Pulse (!) 55   Ht 5\' 9"  (1.753 m)   Wt 179 lb 12.8 oz (81.6 kg)   SpO2 98%   BMI 26.55 kg/m     Wt Readings from Last 3 Encounters:  08/02/18 179 lb 12.8 oz (81.6 kg)  12/18/17 186 lb 3.2 oz (84.5 kg)  09/18/17 192 lb (87.1 kg)     Physical Exam  Constitutional: He is oriented to person, place, and time. He appears well-developed and well-nourished. No distress.  HENT:  Head: Normocephalic and atraumatic.  Neck: Neck supple. No JVD present.  Cardiovascular: Normal rate, S1 normal and S2 normal. An irregularly irregular rhythm present.  No murmur heard. Pulmonary/Chest: Breath sounds normal. He has no rales.  Abdominal: Soft. There is no hepatomegaly.  Musculoskeletal:        General: Edema (trace ankle edema) present.  Neurological: He is alert and oriented to person, place, and time.  Skin: Skin is warm and dry.    ASSESSMENT  & PLAN:    Persistent atrial fibrillation Rate controlled.  Continue Apixaban, Carvedilol.  Obtain FU BMET, CBC in 2 weeks.    Essential hypertension BP is above target now.  Continue current dose of Carvedilol.  He did have increased Creatinine in the past with ACE inhibitor, HCTZ and Amlodipine.  However, I think he should be ok to restart Amlodipine.  I will get a FU BMET in 2 weeks to recheck his Creatinine.  -Amlodipine 2.5 mg QD  -BMET 2 weeks  -Call if BP 140/90 or higher.   Dispo:  Return in about 6 months (around 01/31/2019) for Routine Follow Up, w/ Dr. Tenny Craw, or Tereso Newcomer, PA-C.   Medication Adjustments/Labs and Tests Ordered: Current medicines are reviewed at length with the patient today.  Concerns regarding medicines are outlined above.  Tests Ordered: Orders Placed This Encounter  Procedures  . Basic metabolic panel  . CBC  . EKG 12-Lead   Medication Changes: Meds ordered this encounter  Medications  . amLODipine (NORVASC) 2.5 MG tablet    Sig: Take 1 tablet (2.5 mg total) by mouth daily.    Dispense:  30 tablet    Refill:  11    Order Specific Question:   Supervising Provider    Answer:   Abbey Chatters    Signed, Tereso Newcomer, PA-C  08/02/2018 10:19 AM    [facility] [address] [place], [place], [place]  [address] Phone: [phone]; Fax: [phone]

## 2018-08-19 ENCOUNTER — Telehealth: Payer: Self-pay | Admitting: Internal Medicine

## 2018-08-19 NOTE — Telephone Encounter (Signed)
Daughter wants to know if it is safe for her dad (Pt) to take OTC Medications Immunity boosters specifically and cough meds if necessary.

## 2018-08-20 ENCOUNTER — Other Ambulatory Visit: Payer: Self-pay

## 2018-08-20 ENCOUNTER — Other Ambulatory Visit: Payer: Medicare Other

## 2018-08-20 DIAGNOSIS — I10 Essential (primary) hypertension: Secondary | ICD-10-CM

## 2018-08-20 DIAGNOSIS — I4819 Other persistent atrial fibrillation: Secondary | ICD-10-CM

## 2018-08-20 LAB — CBC
Hematocrit: 40.2 % (ref 37.5–51.0)
Hemoglobin: 13.9 g/dL (ref 13.0–17.7)
MCH: 29.3 pg (ref 26.6–33.0)
MCHC: 34.6 g/dL (ref 31.5–35.7)
MCV: 85 fL (ref 79–97)
Platelets: 183 10*3/uL (ref 150–450)
RBC: 4.74 x10E6/uL (ref 4.14–5.80)
RDW: 13.4 % (ref 11.6–15.4)
WBC: 3.8 10*3/uL (ref 3.4–10.8)

## 2018-08-20 LAB — BASIC METABOLIC PANEL
BUN/Creatinine Ratio: 18 (ref 10–24)
BUN: 21 mg/dL (ref 8–27)
CO2: 23 mmol/L (ref 20–29)
CREATININE: 1.14 mg/dL (ref 0.76–1.27)
Calcium: 9.3 mg/dL (ref 8.6–10.2)
Chloride: 103 mmol/L (ref 96–106)
GFR calc Af Amer: 76 mL/min/{1.73_m2} (ref >59)
GFR calc non Af Amer: 66 mL/min/{1.73_m2} (ref >59)
Glucose: 103 mg/dL — ABNORMAL HIGH (ref 65–99)
Potassium: 4.8 mmol/L (ref 3.5–5.2)
Sodium: 140 mmol/L (ref 134–144)

## 2018-08-21 ENCOUNTER — Telehealth: Payer: Self-pay | Admitting: *Deleted

## 2018-08-21 NOTE — Telephone Encounter (Signed)
LMOVM OF NORMAL RESULTS AND TO CONTACT OFFICE BACK WITH ANY QUESTIONS OR CONCENS

## 2018-11-30 ENCOUNTER — Other Ambulatory Visit: Payer: Self-pay | Admitting: Physician Assistant

## 2018-12-02 ENCOUNTER — Other Ambulatory Visit: Payer: Self-pay | Admitting: Physician Assistant

## 2018-12-02 MED ORDER — CARVEDILOL 6.25 MG PO TABS: 6.2500 mg | ORAL_TABLET | Freq: Two times a day (BID) | ORAL | 2 refills | Status: DC

## 2018-12-02 NOTE — Telephone Encounter (Signed)
Outpatient Medication Detail   Disp Refills Start End   carvedilol (COREG) 6.25 MG tablet 180 tablet 2 12/02/2018    Sig - Route: Take 1 tablet (6.25 mg total) by mouth 2 (two) times daily. - Oral   Sent to pharmacy as: carvedilol (COREG) 6.25 MG tablet   E-Prescribing Status: Receipt confirmed by pharmacy (12/02/2018 1:04 PM EDT)   Pharmacy  [place], [place].

## 2019-01-29 ENCOUNTER — Encounter: Payer: Self-pay | Admitting: Physician Assistant

## 2019-01-29 ENCOUNTER — Ambulatory Visit (INDEPENDENT_AMBULATORY_CARE_PROVIDER_SITE_OTHER): Payer: Medicare Other | Admitting: Physician Assistant

## 2019-01-29 ENCOUNTER — Telehealth: Payer: Self-pay | Admitting: Physician Assistant

## 2019-01-29 ENCOUNTER — Other Ambulatory Visit: Payer: Self-pay

## 2019-01-29 VITALS — BP 142/82 | HR 57 | Ht 70.0 in | Wt 176.4 lb

## 2019-01-29 DIAGNOSIS — I4821 Permanent atrial fibrillation: Secondary | ICD-10-CM | POA: Diagnosis not present

## 2019-01-29 DIAGNOSIS — I10 Essential (primary) hypertension: Secondary | ICD-10-CM

## 2019-01-29 NOTE — Patient Instructions (Signed)
Medication Instructions:  Your physician recommends that you continue on your current medications as directed. Please refer to the Current Medication list given to you today.   If you need a refill on your cardiac medications before your next appointment, please call your pharmacy.   Lab work: NONE  If you have labs (blood work) drawn today and your tests are completely normal, you will receive your results only by: Marland Kitchen MyChart Message (if you have MyChart) OR . A paper copy in the mail If you have any lab test that is abnormal or we need to change your treatment, we will call you to review the results.  Testing/Procedures: NONE  Follow-Up: At [facility], you and your health needs are our priority.  As part of our continuing mission to provide you with exceptional heart care, we have created designated Provider Care Teams.  These Care Teams include your primary Cardiologist (physician) and Advanced Practice Providers (APPs -  Physician Assistants and Nurse Practitioners) who all work together to provide you with the care you need, when you need it. . You will need a follow up appointment in:  6 months.  Please call our office 2 months in advance to schedule this appointment.  You may see Richardson Dopp, PA-C   Any Other Special Instructions Will Be Listed Below (If Applicable).

## 2019-01-29 NOTE — Progress Notes (Signed)
Cardiology Office Note:    Date:  01/29/2019   ID:  Billy Harrell, DOB 12/01/1950, MRN [id number]  PCP:  Lawerance Cruel, MD  Cardiologist:  Dorris Carnes, MD   Electrophysiologist:  None   Referring MD: Lawerance Cruel, MD   Chief Complaint  Patient presents with  . Follow-up    Hypertension, atrial fibrillation     History of Present Illness:    Billy Harrell is a 68 y.o. male with:  Hypertension  Permanent atrial fibrillation  S/p DCCV 11/18 >> ERAF  CHADS2-VASc=2 (HTN, age) >> Apixaban  Chronic kidney disease   ACE inhibitor and HCTZ d/c'd in the past due to worsening Creatinine  RA [place] neg for RAS 11/2017   Billy Harrell was last seen in [place].  He returns for routine follow-up.  He is here with his daughter.  He is doing well. He has not had chest pain, shortness of breath, syncope, paroxysmal nocturnal dyspnea, leg swelling.   Prior CV studies:   The following studies were reviewed today:  Renal Artery [place] 11/07/17 No RAS bilat; normal celiac and SMA  Echo 04/06/17 Mild focal basal septal hypertrophy, EF 55-60, no RWMA, mild AI, mildly dilated Ao root (42 mm), mild MR, mod LAE, mod RAE, mild TR, PASP 32  Past Medical History:  Diagnosis Date  . A-fib ([place])   . HTN (hypertension)    [place] 6/19:  neg for RA stenosis bilat; normal mesenteric arteries   Surgical Hx: The patient  has a past surgical history that includes Cardioversion (N/A, 04/30/2017).   Current Medications: Current Meds  Medication Sig  . acetaminophen (TYLENOL) 500 MG tablet Take 500 mg by mouth as needed.  Marland Kitchen amLODipine (NORVASC) 2.5 MG tablet Take 1 tablet (2.5 mg total) by mouth daily.  Marland Kitchen apixaban (ELIQUIS) 5 MG TABS tablet Take 5 mg by mouth 2 (two) times daily.  . carvedilol (COREG) 6.25 MG tablet Take 1 tablet (6.25 mg total) by mouth 2 (two) times daily.  . Multiple Vitamin (MULTI-VITAMIN PO) Take 1 tablet by mouth daily.  . Omega-3 Fatty Acids (FISH OIL) 1000 MG CAPS Take 1,000  mg by mouth daily.  . Oxymetazoline HCl (NASAL SPRAY NA) Place 1 spray into the nose daily.      Allergies:   Patient has no known allergies.   Social History   Tobacco Use  . Smoking status: Never Smoker  . Smokeless tobacco: Never Used  Substance Use Topics  . Alcohol use: No  . Drug use: No     Family Hx: The patient's family history is not on file.  ROS:   Please see the history of present illness.  He has not had melena, hematochezia.    ROS All other systems reviewed and are negative.   EKGs/Labs/Other Test Reviewed:    EKG:  EKG is  ordered today.  The ekg ordered today demonstrates atrial fibrillation, HR 71, normal axis, QTC 430, no change from prior tracing  Recent Labs: 08/20/2018: BUN 21; Creatinine, Ser 1.14; Hemoglobin 13.9; Platelets 183; Potassium 4.8; Sodium 140   Recent Lipid Panel No results found for: CHOL, TRIG, HDL, CHOLHDL, LDLCALC, LDLDIRECT  Physical Exam:    VS:  BP (!) 142/82   Pulse (!) 57   Ht 5\' 10"  (1.778 m)   Wt 176 lb 6.4 oz (80 kg)   SpO2 99%   BMI 25.31 kg/m     Wt Readings from Last 3 Encounters:  01/29/19 176 lb 6.4 oz (  80 kg)  08/02/18 179 lb 12.8 oz (81.6 kg)  12/18/17 186 lb 3.2 oz (84.5 kg)     Physical Exam  Constitutional: He is oriented to person, place, and time. He appears well-developed and well-nourished. No distress.  HENT:  Head: Normocephalic and atraumatic.  Neck: Neck supple. No JVD present.  Cardiovascular: Normal rate, S1 normal and S2 normal. An irregularly irregular rhythm present.  No murmur heard. Pulmonary/Chest: Breath sounds normal. He has no rales.  Abdominal: Soft. There is no hepatomegaly.  Musculoskeletal:        General: No edema.  Neurological: He is alert and oriented to person, place, and time.  Skin: Skin is warm and dry.    ASSESSMENT & PLAN:    1. Permanent atrial fibrillation Rate controlled.  He is tolerating anticoagulation.  Hgb and Creatinine normal in 08/2018.  Continue  Eliquis.    2. Essential hypertension He brings in a list of his BPs at home.  BPs are optimal.  Continue current medications.     Dispo:  Return in about 6 months (around 08/01/2019) for Routine Follow Up, w/ Dr. Tenny Crawoss, or Tereso NewcomerScott Caylan Chenard, PA-C, (virtual or in-person).   Medication Adjustments/Labs and Tests Ordered: Current medicines are reviewed at length with the patient today.  Concerns regarding medicines are outlined above.  Tests Ordered: Orders Placed This Encounter  Procedures  . EKG 12-Lead   Medication Changes: No orders of the defined types were placed in this encounter.   Signed, Tereso NewcomerScott Marisah Laker, PA-C  01/29/2019 12:48 PM    [facility] [address], [place], [place]  [address] Phone: [phone]; Fax: [phone]

## 2019-01-29 NOTE — Telephone Encounter (Signed)
She may come with him. Richardson Dopp, PA-C    01/29/2019 1:02 PM .

## 2019-01-29 NOTE — Telephone Encounter (Signed)
New Message:    Lenda Kelp pt's daughter, wants to know if she can come in with pt today for his appt? She says she is his interpreter.

## 2019-02-06 ENCOUNTER — Other Ambulatory Visit (HOSPITAL_COMMUNITY): Payer: Self-pay | Admitting: Nurse Practitioner

## 2019-02-06 NOTE — Telephone Encounter (Signed)
Eliquis 5mg  refill request received, pt 78 is yrs old, weight-80kg, Crea-1.14 on 08/20/2018, Diagnosis-Afib, and last seen by on Richardson Dopp on 01/29/2019. Dose is appropriate based on dosing criteria. Will send in refill to requested pharmacy.

## 2019-03-28 ENCOUNTER — Encounter (INDEPENDENT_AMBULATORY_CARE_PROVIDER_SITE_OTHER): Payer: Self-pay

## 2019-06-11 NOTE — Progress Notes (Addendum)
[facility] - Clinic Note  06/19/2019     CHIEF COMPLAINT Patient presents for Retina Evaluation   HISTORY OF PRESENT ILLNESS: Billy Harrell is a 69 y.o. male who presents to the clinic today for:   HPI    Retina Evaluation    In both eyes.  This started 1 month ago.  Duration of 1 month.  Associated Symptoms Floaters.  Negative for Flashes, Blind Spot, Photophobia, Scalp Tenderness, Fever, Pain, Glare, Jaw Claudication, Weight Loss, Distortion, Redness, Trauma, Shoulder/Hip pain and Fatigue.  Context:  distance vision, mid-range vision and near vision.  Treatments tried include no treatments.  I, the attending physician,  performed the HPI with the patient and updated documentation appropriately.          Comments    Patient states floaters OU for about 1 month OU. Denies flashes. Denies curtain/veil over vision.       Last edited by Rennis Chris, MD on 06/19/2019  9:11 AM. (History)    pt is here on the referral of Dr. Krista Blue for retinal edema, pt states he has high blood pressure and is on medication, pt is also on a blood thinner, pt states he was dx 2 years ago, pt states he went to see Dr. Krista Blue bc he was seeing floaters, pt states his blood pressure usually runs around 135/80  Referring physician: Demarco, [place], OD [address] [place],  [place] [address]  HISTORICAL INFORMATION:   Selected notes from the MEDICAL RECORD NUMBER Referred by Dr. Krista Blue for retina eval   CURRENT MEDICATIONS: Current Outpatient Medications (Ophthalmic Drugs)  Medication Sig  . latanoprost (XALATAN) 0.005 % ophthalmic solution Place 1 drop into both eyes at bedtime.   No current facility-administered medications for this visit. (Ophthalmic Drugs)   Current Outpatient Medications (Other)  Medication Sig  . acetaminophen (TYLENOL) 500 MG tablet Take 500 mg by mouth as needed.  Marland Kitchen amLODipine (NORVASC) 2.5 MG tablet Take 1 tablet (2.5 mg total) by mouth daily.  .  carvedilol (COREG) 6.25 MG tablet Take 1 tablet (6.25 mg total) by mouth 2 (two) times daily.  Marland Kitchen ELIQUIS 5 MG TABS tablet Take 1 tablet by mouth twice daily  . Multiple Vitamin (MULTI-VITAMIN PO) Take 1 tablet by mouth daily.  . Omega-3 Fatty Acids (FISH OIL) 1000 MG CAPS Take 1,000 mg by mouth daily.  . Oxymetazoline HCl (NASAL SPRAY NA) Place 1 spray into the nose daily.    No current facility-administered medications for this visit. (Other)      REVIEW OF SYSTEMS: ROS    Positive for: Eyes   Negative for: Constitutional, Gastrointestinal, Neurological, Skin, Genitourinary, Musculoskeletal, HENT, Endocrine, Cardiovascular, Respiratory, Psychiatric, Allergic/Imm, Heme/Lymph   Last edited by Annalee Genta D, COT on 06/19/2019  8:34 AM. (History)       ALLERGIES No Known Allergies  PAST MEDICAL HISTORY Past Medical History:  Diagnosis Date  . A-fib ([facility])   . HTN (hypertension)    [place] 6/19:  neg for RA stenosis bilat; normal mesenteric arteries   Past Surgical History:  Procedure Laterality Date  . CARDIOVERSION N/A 04/30/2017   Procedure: CARDIOVERSION;  Surgeon: Wendall Stade, MD;  Location: [facility] ENDOSCOPY;  Service: Cardiovascular;  Laterality: N/A;    FAMILY HISTORY History reviewed. No pertinent family history.  SOCIAL HISTORY Social History   Tobacco Use  . Smoking status: Never Smoker  . Smokeless tobacco: Never Used  Substance Use Topics  . Alcohol use: No  . Drug  use: No         OPHTHALMIC EXAM:  Base Eye Exam    Visual Acuity (Snellen - Linear)      Right Left   Dist cc 20/25 -1 20/20 -1   Dist ph cc 20/25 +2 20/20 -1   Correction: Glasses       Tonometry (Tonopen, 8:43 AM)      Right Left   Pressure 19 18       Pupils      Dark Light Shape React APD   Right 4 3 Round Slow None   Left 4 3 Round Slow None       Visual Fields (Counting fingers)      Left Right    Full Full       Extraocular Movement      Right Left    Full, Ortho  Full, Ortho       Neuro/Psych    Oriented x3: Yes   Mood/Affect: Normal       Dilation    Both eyes: 1.0% Mydriacyl, 2.5% Phenylephrine @ 8:43 AM        Slit Lamp and Fundus Exam    Slit Lamp Exam      Right Left   Lids/Lashes Dermatochalasis - upper lid, Meibomian gland dysfunction Dermatochalasis - upper lid, Meibomian gland dysfunction   Conjunctiva/Sclera Mild Melanosis Mild Melanosis   Cornea Trace Punctate epithelial erosions Trace Punctate epithelial erosions   Anterior Chamber Deep and quiet Deep and quiet   Iris Round and dilated Round and dilated   Lens 2+ Nuclear sclerosis, 2-3+ Cortical cataract, Pseudoexfoliative material on anterior capsule 2+ Nuclear sclerosis, 2-3+ Cortical cataract   Vitreous Mild Vitreous syneresis, Posterior vitreous detachment Mild Vitreous syneresis, vitreous condensations       Fundus Exam      Right Left   Disc Pink and Sharp, mild cupping Pink and Sharp, mild cupping   C/D Ratio 0.6 0.6   Macula Blunted foveal reflex, focal IRH/MA IT to fovea, RPE mottling and clumping Flat, good foveal reflex, focal IRH/MA inferior and temporal to fovea   Vessels Vascular attenuation, Tortuous, mild AV crossing changes, mild Copper wiring Vascular attenuation, Tortuous, mild AV crossing changes, mild Copper wiring   Periphery Attached, No heme  Attached, No heme         Refraction    Wearing Rx      Sphere Cylinder Axis   Right [place] +0.50 170   Left -0.75 +0.75 170       Manifest Refraction      Sphere Cylinder Axis Dist VA   Right [place] +0.50 170 20/25+2   Left -0.75 +0.75 170 20/20-2          IMAGING AND PROCEDURES  Imaging and Procedures for @TODAY @  OCT, Retina - OU - Both Eyes       Right Eye Quality was good. Central Foveal Thickness: 326. Progression has no prior data. Findings include intraretinal fluid, no SRF, abnormal foveal contour.   Left Eye Quality was good. Central Foveal Thickness: 266. Progression has no prior  data. Findings include normal foveal contour, no IRF, no SRF (Trace focal IRHM).   Notes *Images captured and stored on drive  Diagnosis / Impression:  OD: abnormal foveal reflex, no SRF, +IRF, +IRHM OS: NFP, no IRF/SRF; tr focal IRHM  Clinical management:  See below  Abbreviations: NFP - Normal foveal profile. CME - cystoid macular edema. PED - pigment epithelial detachment. IRF - intraretinal fluid.  SRF - subretinal fluid. EZ - ellipsoid zone. ERM - epiretinal membrane. ORA - outer retinal atrophy. ORT - outer retinal tubulation. SRHM - subretinal hyper-reflective material        Fluorescein Angiography Optos (Transit OD)       Right Eye   Progression has no prior data. Early phase findings include leakage, microaneurysm (Focal hyperfluorescent leakage IT macula). Mid/Late phase findings include leakage, microaneurysm (Continuation of focal leakage; staining of disc).   Left Eye   Progression has no prior data. Early phase findings include microaneurysm. Mid/Late phase findings include leakage, microaneurysm, staining (Focal, mild leakage parafovea greatest 0300, late staining of disc).   Notes **Images stored on drive**  Impression: Focal parafoveal hyperfluorescent leakage OU (OD > OS) +MA OU Mild late staining of disc OU                  ASSESSMENT/PLAN:    ICD-10-CM   1. Hypertensive retinopathy of both eyes  H35.033 Fluorescein Angiography Optos (Transit OD)  2. Retinal hemorrhage of both eyes  H35.63   3. Retinal edema  H35.81 OCT, Retina - OU - Both Eyes  4. Essential hypertension  I10   5. Combined forms of age-related cataract of both eyes  H25.813   6. 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 Hypertensive retinopathy with retinal hemorrhage OU and retinal edema OD  - BCVA remains good (20/25 OD, 20/20 OS) and pt essentially asymptomatic  - OCT shows focal IRF/IRHM OD; OS with mild IRHM, no edema  - FA shows focal, parafoveal  hyperfluorescent leakage OU (OD >> OS)  - discussed importance of tight BP control  - BP in office today 140s / 99 and 96 in R and L arms respectively  - pt reports white coat hypertension with BPs measured at home typically 120-130s / 80s  - retinal pathology similar in appearance to diabetic retinopathy but pt is not diabetic and findings slightly more focal than what is seen in diabetes  - suspect hemorrhage and edema related to HTN and use of eliquis for A fib  - as vision remains very good and findings/symptoms very mild, no acute ocular intervention recommended at this time  - discussed possibility of anti-VEGF therapy if edema or vision worsens   - will discuss case with Cardiology for the consideration of f/u of HTN and possible adjustment in medications  - recommend f/u 4 weeks, sooner prn, with DFE/OCT  5. 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  - discussed diagnosis and progression  - not yet visually significant  - monitor for now  6. Pseudoexfoliation Syndrome OD  - pseudoexfoliation of lens OD  Ophthalmic Meds Ordered this visit:  No orders of the defined types were placed in this encounter.      Return in about 4 weeks (around 07/17/2019) for f/u HTN retinopathy, DFE, OCT.  There are no Patient Instructions on file for this visit.   Explained the diagnoses, plan, and follow up with the patient and they expressed understanding.  Patient expressed understanding of the importance of proper follow up care.   Gardiner Sleeper, M.D., Ph.D. Diseases & Surgery of the Retina and Vitreous [facility]  I have reviewed the above documentation for accuracy and completeness, and I agree with the above. Gardiner Sleeper, M.D., Ph.D. 06/20/19 12:09 AM   Abbreviations: Jerilynn Mages  myopia (nearsighted); A astigmatism; H hyperopia (farsighted); P presbyopia; Mrx spectacle  prescription;  CTL contact lenses; OD right eye; OS left eye; OU both eyes  XT exotropia; ET esotropia; PEK punctate epithelial keratitis; PEE punctate epithelial erosions; DES dry eye syndrome; MGD meibomian gland dysfunction; ATs artificial tears; PFAT's preservative free artificial tears; NSC nuclear sclerotic cataract; PSC posterior subcapsular cataract; ERM epi-retinal membrane; PVD posterior vitreous detachment; RD retinal detachment; DM diabetes mellitus; DR diabetic retinopathy; NPDR non-proliferative diabetic retinopathy; PDR proliferative diabetic retinopathy; CSME clinically significant macular edema; DME diabetic macular edema; dbh dot blot hemorrhages; CWS cotton wool spot; POAG primary open angle glaucoma; C/D cup-to-disc ratio; HVF humphrey visual field; GVF goldmann visual field; OCT optical coherence tomography; IOP intraocular pressure; BRVO Branch retinal vein occlusion; CRVO central retinal vein occlusion; CRAO central retinal artery occlusion; BRAO branch retinal artery occlusion; RT retinal tear; SB scleral buckle; PPV pars plana vitrectomy; VH Vitreous hemorrhage; PRP panretinal laser photocoagulation; IVK intravitreal kenalog; VMT vitreomacular traction; MH Macular hole;  NVD neovascularization of the disc; NVE neovascularization elsewhere; AREDS age related eye disease study; ARMD age related macular degeneration; POAG primary open angle glaucoma; EBMD epithelial/anterior basement membrane dystrophy; ACIOL anterior chamber intraocular lens; IOL intraocular lens; PCIOL posterior chamber intraocular lens; Phaco/IOL phacoemulsification with intraocular lens placement; PRK photorefractive keratectomy; LASIK laser assisted in situ keratomileusis; HTN hypertension; DM diabetes mellitus; COPD chronic obstructive pulmonary disease

## 2019-06-19 ENCOUNTER — Ambulatory Visit (INDEPENDENT_AMBULATORY_CARE_PROVIDER_SITE_OTHER): Payer: Medicare Other | Admitting: Ophthalmology

## 2019-06-19 ENCOUNTER — Encounter (INDEPENDENT_AMBULATORY_CARE_PROVIDER_SITE_OTHER): Payer: Self-pay | Admitting: Ophthalmology

## 2019-06-19 ENCOUNTER — Other Ambulatory Visit: Payer: Self-pay

## 2019-06-19 VITALS — BP 145/96 | HR 74

## 2019-06-19 DIAGNOSIS — H3563 Retinal hemorrhage, bilateral: Secondary | ICD-10-CM | POA: Diagnosis not present

## 2019-06-19 DIAGNOSIS — I10 Essential (primary) hypertension: Secondary | ICD-10-CM | POA: Diagnosis not present

## 2019-06-19 DIAGNOSIS — H268 Other specified cataract: Secondary | ICD-10-CM

## 2019-06-19 DIAGNOSIS — H35033 Hypertensive retinopathy, bilateral: Secondary | ICD-10-CM | POA: Diagnosis not present

## 2019-06-19 DIAGNOSIS — H25813 Combined forms of age-related cataract, bilateral: Secondary | ICD-10-CM

## 2019-06-19 DIAGNOSIS — H3581 Retinal edema: Secondary | ICD-10-CM

## 2019-06-20 ENCOUNTER — Telehealth: Payer: Self-pay | Admitting: Physician Assistant

## 2019-06-20 DIAGNOSIS — I10 Essential (primary) hypertension: Secondary | ICD-10-CM

## 2019-06-20 MED ORDER — AMLODIPINE BESYLATE 5 MG PO TABS: 5.0000 mg | ORAL_TABLET | Freq: Every day | ORAL | 11 refills | Status: DC

## 2019-06-20 NOTE — Telephone Encounter (Signed)
DPR on record.  Spoke with pt daughter, Jearld Adjutant.  Advised of [employer] instructions to increase Amlodipine to 5mg  daily.  Order entered for 24 hour BP monitor.  Scheduled pt for office visit on 2/15, spouse v/u to ensure BP monitor completed prior to visit.  Updated Rx for Amlodipine sent to pharmacy.

## 2019-06-20 NOTE — Telephone Encounter (Signed)
Called and left patient a message to call back.

## 2019-06-20 NOTE — Telephone Encounter (Signed)
Follow Up:       Returning Emily's call from today.

## 2019-06-20 NOTE — Telephone Encounter (Signed)
I received a note from his ophthalmologist regarding retinal hemorrhage which may be related to uncontrolled hypertension.   Please call patient: PLAN:  1. Increase Amlodipine to 5 mg once daily  2. Arrange 24 hour BP monitor 3. Arrange follow up with me in next 3-4 weeks.  Make sure BP monitor completed before appt. Tereso Newcomer, PA-C    06/20/2019 8:21 AM

## 2019-07-03 NOTE — Progress Notes (Signed)
[facility] Note  07/17/2019     CHIEF COMPLAINT Patient presents for Retina Follow Up   HISTORY OF PRESENT ILLNESS: Billy Harrell is a 69 y.o. male who presents to the clinic today for:   HPI    Retina Follow Up    Patient presents with  Other.  In both eyes.  This started 4 weeks ago.  Severity is moderate.  I, the attending physician,  performed the HPI with the patient and updated documentation appropriately.          Comments    Patient here for 4 weeks retina follow up for Htn Ret OU. Patient states vision doing ok. No eye pain.       Last edited by Bernarda Caffey, MD on 07/17/2019  9:10 AM. (History)    pt is with his daughter today, she states that his dr doubled his blood pressure medication, pts BP today is 127/86   Referring physician: Lawerance Cruel, MD [place],   [id number]  HISTORICAL INFORMATION:   Selected notes from the [place] Referred by Dr. Parke Simmers for retina eval   CURRENT MEDICATIONS: Current Outpatient Medications (Ophthalmic Drugs)  Medication Sig  . latanoprost (XALATAN) 0.005 % ophthalmic solution Place 1 drop into both eyes at bedtime.   No current facility-administered medications for this visit. (Ophthalmic Drugs)   Current Outpatient Medications (Other)  Medication Sig  . acetaminophen (TYLENOL) 500 MG tablet Take 500 mg by mouth as needed.  Marland Kitchen amLODipine (NORVASC) 5 MG tablet Take 1 tablet (5 mg total) by mouth daily.  . carvedilol (COREG) 6.25 MG tablet Take 1 tablet (6.25 mg total) by mouth 2 (two) times daily.  Marland Kitchen ELIQUIS 5 MG TABS tablet Take 1 tablet by mouth twice daily  . Multiple Vitamin (MULTI-VITAMIN PO) Take 1 tablet by mouth daily.  . Omega-3 Fatty Acids (FISH OIL) 1000 MG CAPS Take 1,000 mg by mouth daily.  . Oxymetazoline HCl (NASAL SPRAY NA) Place 1 spray into the nose daily.    No current facility-administered medications for this visit. (Other)       REVIEW OF SYSTEMS: ROS    Positive for: Eyes   Negative for: Constitutional, Gastrointestinal, Neurological, Skin, Genitourinary, Musculoskeletal, HENT, Endocrine, Cardiovascular, Respiratory, Psychiatric, Allergic/Imm, Heme/Lymph   Last edited by Theodore Demark, COA on 07/17/2019  8:59 AM. (History)       ALLERGIES No Known Allergies  PAST MEDICAL HISTORY Past Medical History:  Diagnosis Date  . A-fib ([place])   . HTN (hypertension)    [place] 6/19:  neg for RA stenosis bilat; normal mesenteric arteries   Past Surgical History:  Procedure Laterality Date  . CARDIOVERSION N/A 04/30/2017   Procedure: CARDIOVERSION;  Surgeon: Josue Hector, MD;  Location: [facility] ENDOSCOPY;  Service: Cardiovascular;  Laterality: N/A;    FAMILY HISTORY History reviewed. No pertinent family history.  SOCIAL HISTORY Social History   Tobacco Use  . Smoking status: Never Smoker  . Smokeless tobacco: Never Used  Substance Use Topics  . Alcohol use: No  . Drug use: No         OPHTHALMIC EXAM:  Base Eye Exam    Visual Acuity (Snellen - Linear)      Right Left   Dist [place] 20/20 20/20 -2       Tonometry (Tonopen, 8:57 AM)      Right Left   Pressure 19 19       Pupils  Dark Light Shape React APD   Right 4 3 Round Slow None   Left 4 3 Round Slow None       Visual Fields (Counting fingers)      Left Right    Full Full       Extraocular Movement      Right Left    Full, Ortho Full, Ortho       Neuro/Psych    Oriented x3: Yes   Mood/Affect: Normal       Dilation    Both eyes: 1.0% Mydriacyl, 2.5% Phenylephrine @ 8:56 AM        Slit Lamp and Fundus Exam    Slit Lamp Exam      Right Left   Lids/Lashes Dermatochalasis - upper lid, Meibomian gland dysfunction Dermatochalasis - upper lid, Meibomian gland dysfunction   Conjunctiva/Sclera Mild Melanosis Mild Melanosis   Cornea Trace Punctate epithelial erosions, mild Debris in tear film Trace Punctate epithelial  erosions   Anterior Chamber Deep and quiet Deep and quiet   Iris Round and dilated Round and dilated   Lens 2+ Nuclear sclerosis, 2-3+ Cortical cataract, Pseudoexfoliative material on anterior capsule 2+ Nuclear sclerosis, 2-3+ Cortical cataract   Vitreous Mild Vitreous syneresis, Posterior vitreous detachment Mild Vitreous syneresis, vitreous condensations       Fundus Exam      Right Left   Disc Pink and Sharp, +cupping Pink and Sharp, +cupping   C/D Ratio 0.6 0.6   Macula Flat, Blunted foveal reflex, focal IRH/MA IT to fovea with surrounding edema -- improved from prior, RPE mottling and clumping Flat, good foveal reflex, focal IRH/MA inferior and temporal to fovea   Vessels Vascular attenuation, Tortuous, mild AV crossing changes Vascular attenuation, Tortuous, mild AV crossing changes   Periphery Attached, No heme  Attached, No heme         Refraction    Wearing Rx      Sphere Cylinder Axis   Right [place] +0.50 170   Left -0.75 +0.75 170          IMAGING AND PROCEDURES  Imaging and Procedures for '@TODAY'$ @  OCT, Retina - OU - Both Eyes       Right Eye Quality was good. Central Foveal Thickness: 304. Progression has improved. Findings include intraretinal fluid, no SRF, abnormal foveal contour.   Left Eye Quality was good. Central Foveal Thickness: 268. Progression has been stable. Findings include normal foveal contour, no IRF, no SRF (Trace focal IRHM).   Notes *Images captured and stored on drive  Diagnosis / Impression:  OD: abnormal foveal reflex, interval improvement in IRF OS: NFP, no IRF/SRF; tr focal IRHM  Clinical management:  See below  Abbreviations: NFP - Normal foveal profile. CME - cystoid macular edema. PED - pigment epithelial detachment. IRF - intraretinal fluid. SRF - subretinal fluid. EZ - ellipsoid zone. ERM - epiretinal membrane. ORA - outer retinal atrophy. ORT - outer retinal tubulation. SRHM - subretinal hyper-reflective material                  ASSESSMENT/PLAN:    ICD-10-CM   1. Hypertensive retinopathy of both eyes  H35.033   2. Retinal hemorrhage of both eyes  H35.63   3. Retinal edema  H35.81 OCT, Retina - OU - Both Eyes  4. Essential hypertension  I10   5. Combined forms of age-related cataract of both eyes  H25.813   6. 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  Hypertensive retinopathy with retinal hemorrhage OU and retinal edema OD  - BCVA remains good (20/20 OU) and pt essentially asymptomatic  - original OCT showed focal IRF/IRHM OD; OS with mild IRHM, no edema  - FA (01.14.21) shows focal, parafoveal hyperfluorescent leakage OU (OD >> OS)  - BP at initial visit 140s / 99 and 96 in R and L arms respectively  - suspect hemorrhage and edema related to HTN and use of eliquis for A fib  - BP meds adjusted and exam and OCT today show interval improvement in [facility] and retinal edema  - no acute ocular intervention recommended at this time  - discussed possibility of anti-VEGF therapy if edema or vision worsens   - recommend f/u 6-8 weeks, sooner prn, with DFE/OCT  5. 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  - discussed diagnosis and progression  - not yet visually significant  - monitor for now  6. Pseudoexfoliation Syndrome OD  - pseudoexfoliation of lens OD  Ophthalmic Meds Ordered this visit:  No orders of the defined types were placed in this encounter.      Return for f/u 6-8 weeks, HTN ret OU, DFE, OCT.  There are no Patient Instructions on file for this visit.   Explained the diagnoses, plan, and follow up with the patient and they expressed understanding.  Patient expressed understanding of the importance of proper follow up care.   This document serves as a record of services personally performed by Gardiner Sleeper, MD, PhD. It was created on their behalf by Leeann Must,  [place], a certified ophthalmic assistant. The creation of this record is the provider's dictation and/or activities during the visit.    Electronically signed by: Leeann Must, COA '@TODAY'$ @ 11:09 PM   This document serves as a record of services personally performed by Gardiner Sleeper, MD, PhD. It was created on their behalf by Ernest Mallick, OA, an ophthalmic assistant. The creation of this record is the provider's dictation and/or activities during the visit.    Electronically signed by: Ernest Mallick, OA 02.11.2021 11:09 PM   Gardiner Sleeper, M.D., Ph.D. Diseases & Surgery of the Retina and Vitreous [facility]  I have reviewed the above documentation for accuracy and completeness, and I agree with the above. Gardiner Sleeper, M.D., Ph.D. 07/18/19 11:09 PM   Abbreviations: M myopia (nearsighted); A astigmatism; H hyperopia (farsighted); P presbyopia; Mrx spectacle prescription;  CTL contact lenses; OD right eye; OS left eye; OU both eyes  XT exotropia; ET esotropia; PEK punctate epithelial keratitis; PEE punctate epithelial erosions; DES dry eye syndrome; MGD meibomian gland dysfunction; ATs artificial tears; PFAT's preservative free artificial tears; [place] nuclear sclerotic cataract; PSC posterior subcapsular cataract; ERM epi-retinal membrane; PVD posterior vitreous detachment; RD retinal detachment; DM diabetes mellitus; DR diabetic retinopathy; NPDR non-proliferative diabetic retinopathy; PDR proliferative diabetic retinopathy; CSME clinically significant macular edema; DME diabetic macular edema; dbh dot blot hemorrhages; CWS cotton wool spot; POAG primary open angle glaucoma; C/D cup-to-disc ratio; HVF humphrey visual field; GVF goldmann visual field; OCT optical coherence tomography; IOP intraocular pressure; BRVO Branch retinal vein occlusion; CRVO central retinal vein occlusion; CRAO central retinal artery occlusion; BRAO branch retinal artery occlusion; RT retinal tear;  SB scleral buckle; PPV pars plana vitrectomy; VH Vitreous hemorrhage; PRP panretinal laser photocoagulation; IVK intravitreal kenalog; VMT vitreomacular traction; MH Macular hole;  NVD neovascularization of the  disc; NVE neovascularization elsewhere; AREDS age related eye disease study; ARMD age related macular degeneration; POAG primary open angle glaucoma; EBMD epithelial/anterior basement membrane dystrophy; ACIOL anterior chamber intraocular lens; IOL intraocular lens; PCIOL posterior chamber intraocular lens; Phaco/IOL phacoemulsification with intraocular lens placement; [place] photorefractive keratectomy; LASIK laser assisted in situ keratomileusis; HTN hypertension; DM diabetes mellitus; COPD chronic obstructive pulmonary disease

## 2019-07-08 NOTE — Telephone Encounter (Signed)
Follow Up  Patient's daughter is calling in to speak with nurse about 24 hr blood pressure cuff and returning a missed call. Please give patient's daughter a call back.

## 2019-07-09 ENCOUNTER — Telehealth: Payer: Self-pay | Admitting: *Deleted

## 2019-07-09 NOTE — Telephone Encounter (Signed)
Please call [phone] to arrange for 24 hour ambulatory BP monitor.  Monitor was requested to be done prior to your follow up appointment with Tereso Newcomer, PA-C.  We have appointments available Thursday 07/10/19.

## 2019-07-10 ENCOUNTER — Ambulatory Visit (INDEPENDENT_AMBULATORY_CARE_PROVIDER_SITE_OTHER): Payer: Medicare Other

## 2019-07-10 ENCOUNTER — Other Ambulatory Visit: Payer: Self-pay

## 2019-07-10 DIAGNOSIS — I10 Essential (primary) hypertension: Secondary | ICD-10-CM

## 2019-07-17 ENCOUNTER — Encounter (INDEPENDENT_AMBULATORY_CARE_PROVIDER_SITE_OTHER): Payer: Self-pay | Admitting: Ophthalmology

## 2019-07-17 ENCOUNTER — Ambulatory Visit (INDEPENDENT_AMBULATORY_CARE_PROVIDER_SITE_OTHER): Payer: Medicare Other | Admitting: Ophthalmology

## 2019-07-17 ENCOUNTER — Other Ambulatory Visit: Payer: Self-pay

## 2019-07-17 VITALS — BP 127/86 | HR 84

## 2019-07-17 DIAGNOSIS — I10 Essential (primary) hypertension: Secondary | ICD-10-CM

## 2019-07-17 DIAGNOSIS — H3563 Retinal hemorrhage, bilateral: Secondary | ICD-10-CM | POA: Diagnosis not present

## 2019-07-17 DIAGNOSIS — H35033 Hypertensive retinopathy, bilateral: Secondary | ICD-10-CM

## 2019-07-17 DIAGNOSIS — H3581 Retinal edema: Secondary | ICD-10-CM | POA: Diagnosis not present

## 2019-07-17 DIAGNOSIS — H268 Other specified cataract: Secondary | ICD-10-CM

## 2019-07-17 DIAGNOSIS — H25813 Combined forms of age-related cataract, bilateral: Secondary | ICD-10-CM

## 2019-07-20 NOTE — Progress Notes (Signed)
Cardiology Office Note:    Date:  07/21/2019   ID:  Billy Harrell, DOB 1951/03/31, MRN [id number]  PCP:  Daisy Floro, MD  Cardiologist:  Dietrich Pates, MD   Electrophysiologist:  None   Referring MD: Daisy Floro, MD   Chief Complaint:  Follow-up (HTN, AFib)    Patient Profile:    Billy Harrell is a 69 y.o. male with:   Hypertension  Permanent atrial fibrillation ? S/p DCCV 11/18 >> ERAF ? CHADS2-VASc=2 (HTN, age) >> Apixaban  Chronic kidney disease  ? ACE inhibitor and HCTZ d/c'd in the past due to worsening Creatinine ? RA [place] neg for RAS 11/2017  Prior CV studies: 24 Hr BP Monitor 07/10/2019 24 hour BP monitor   Average BP through period is 118/79. mmHg Lowest systolic 89/  Mm Hg  Highest systolic 147/ mm Hg  Renal Artery [place] 11/07/17 No RAS bilat; normal celiac and SMA  Echo 04/06/17 Mild focal basal septal hypertrophy, EF 55-60, no RWMA, mild AI, mildly dilated Ao root (42 mm), mild MR, mod LAE, mod RAE, mild TR, PASP 32  History of Present Illness:    Billy Harrell was last seen in 01/2019.  Since then, we were contacted by his ophthalmologist.  He had evidence of hypertensive retinopathy.  I adjusted his Amlodipine.  We obtained a follow up 24 hour BP monitor.  This demonstrated an avg BP of 118/79.  He returns for follow up.  He is here alone.  His BPs readings at home have been optimal.  He usually has higher BP readings in the office.  He has not had any chest pain, significant shortness of breath, orthopnea, paroxysmal nocturnal dyspnea, syncope.  He has dependent lower extremity edema.  This is overall stable.        Past Medical History:  Diagnosis Date  . Chronic kidney disease    ACE inhibitor and HCTZ DCd in past due to rising Creatinine  . Hypertension    [place] 6/19:  neg for RA stenosis bilat; normal mesenteric arteries  . Permanent atrial fibrillation    s/p DCCV 04/2017 >> failed // CHADS2-VASc=2 >> Apixaban Rx    Current Medications: Current Meds    Medication Sig  . acetaminophen (TYLENOL) 500 MG tablet Take 500 mg by mouth as needed.  Marland Kitchen amLODipine (NORVASC) 5 MG tablet Take 1 tablet (5 mg total) by mouth daily.  . carvedilol (COREG) 6.25 MG tablet Take 1 tablet (6.25 mg total) by mouth 2 (two) times daily.  Marland Kitchen ELIQUIS 5 MG TABS tablet Take 1 tablet by mouth twice daily  . latanoprost (XALATAN) 0.005 % ophthalmic solution Place 1 drop into both eyes at bedtime.  . Multiple Vitamin (MULTI-VITAMIN PO) Take 1 tablet by mouth daily.  . Omega-3 Fatty Acids (FISH OIL) 1000 MG CAPS Take 1,000 mg by mouth daily.     Allergies:   Patient has no known allergies.   Social History   Tobacco Use  . Smoking status: Never Smoker  . Smokeless tobacco: Never Used  Substance Use Topics  . Alcohol use: No  . Drug use: No     Family Hx: The patient's family history is not on file.  ROS   EKGs/Labs/Other Test Reviewed:    EKG:  EKG is  ordered today.  The ekg ordered today demonstrates atrial fibrillation, HR 62, normal axis, PVC, QTc 406, no change from prior tracing.   Recent Labs: 08/20/2018: BUN 21; Creatinine, Ser 1.14; Hemoglobin 13.9; Platelets 183; Potassium  4.8; Sodium 140   Recent Lipid Panel No results found for: CHOL, TRIG, HDL, CHOLHDL, LDLCALC, LDLDIRECT     Physical Exam:    VS:  BP 140/82   Pulse 62   Ht 5\' 10"  (1.778 m)   Wt 181 lb 6.4 oz (82.3 kg)   SpO2 96%   BMI 26.03 kg/m     Wt Readings from Last 3 Encounters:  07/21/19 181 lb 6.4 oz (82.3 kg)  01/29/19 176 lb 6.4 oz (80 kg)  08/02/18 179 lb 12.8 oz (81.6 kg)     Constitutional:      Appearance: Healthy appearance. Not in distress.  Neck:     Thyroid: Thyroid normal.     Vascular: JVD normal.  Pulmonary:     Effort: Pulmonary effort is normal.     Breath sounds: No wheezing. No rales.  Cardiovascular:     Normal rate. Irregularly irregular rhythm. Normal S1. Normal S2.     Murmurs: There is no murmur.  Edema:    Pretibial: bilateral trace  edema of the pretibial area.    Ankle: bilateral trace edema of the ankle. Abdominal:     Palpations: Abdomen is soft. There is no hepatomegaly.  Skin:    General: Skin is warm and dry.  Neurological:     General: No focal deficit present.     Mental Status: Alert and oriented to person, place and time.     Cranial Nerves: Cranial nerves are intact.      ASSESSMENT & PLAN:    1. Essential hypertension The patient's blood pressure is controlled on his current regimen.  Continue current therapy with amlodipine 5 mg once daily and carvedilol 6.25 mg twice daily.    2. Permanent atrial fibrillation (HCC) Rate is well controlled.  He is tolerating anticoagulation.  Continue current dose of Carvedilol for rate control and Apixaban for anticoagulation.  Recent BMET obtained by primary care ([facility]) was personally reviewed and independently interpreted.  I will also request a copy of the most recent CBC to review/interpret.     Dispo:  Return in about 6 months (around 01/18/2020) for Routine Follow Up, w/ Dr. Harrington Challenger, or Richardson Dopp, PA-C, via Telemedicine.   Medication Adjustments/Labs and Tests Ordered: Current medicines are reviewed at length with the patient today.  Concerns regarding medicines are outlined above.  Tests Ordered: Orders Placed This Encounter  Procedures  . EKG 12-Lead   Medication Changes: No orders of the defined types were placed in this encounter.   Signed, Richardson Dopp, PA-C  07/21/2019 10:27 AM    [facility] [place], [place], [place]  [address] Phone: [phone]; Fax: [phone]

## 2019-07-21 ENCOUNTER — Other Ambulatory Visit: Payer: Self-pay

## 2019-07-21 ENCOUNTER — Encounter: Payer: Self-pay | Admitting: Physician Assistant

## 2019-07-21 ENCOUNTER — Ambulatory Visit: Payer: Medicare Other | Admitting: Physician Assistant

## 2019-07-21 VITALS — BP 140/82 | HR 62 | Ht 70.0 in | Wt 181.4 lb

## 2019-07-21 DIAGNOSIS — I4821 Permanent atrial fibrillation: Secondary | ICD-10-CM

## 2019-07-21 DIAGNOSIS — I10 Essential (primary) hypertension: Secondary | ICD-10-CM

## 2019-07-21 NOTE — Patient Instructions (Signed)
Medication Instructions:  Your physician recommends that you continue on your current medications as directed. Please refer to the Current Medication list given to you today.  *If you need a refill on your cardiac medications before your next appointment, please call your pharmacy*  Lab Work: NONE If you have labs (blood work) drawn today and your tests are completely normal, you will receive your results only by: Marland Kitchen MyChart Message (if you have MyChart) OR . A paper copy in the mail If you have any lab test that is abnormal or we need to change your treatment, we will call you to review the results.  Testing/Procedures: NONE  Follow-Up: At [facility], you and your health needs are our priority.  As part of our continuing mission to provide you with exceptional heart care, we have created designated Provider Care Teams.  These Care Teams include your primary Cardiologist (physician) and Advanced Practice Providers (APPs -  Physician Assistants and Nurse Practitioners) who all work together to provide you with the care you need, when you need it.  Your next appointment:   6 month(s)  The format for your next appointment:   Virtual Visit   Provider:   Dietrich Pates, MD or Tereso Newcomer, PA-C  Other Instructions

## 2019-08-19 ENCOUNTER — Ambulatory Visit: Payer: Medicare Other | Admitting: Physician Assistant

## 2019-08-28 NOTE — Progress Notes (Signed)
[facility] Note  09/04/2019     CHIEF COMPLAINT Patient presents for Retina Follow Up   HISTORY OF PRESENT ILLNESS: Billy Harrell is a 69 y.o. male who presents to the clinic today for:   HPI    Retina Follow Up    Patient presents with  Other.  In both eyes.  This started months ago.  Severity is mild.  Duration of 6 weeks.  Since onset it is stable.  I, the attending physician,  performed the HPI with the patient and updated documentation appropriately.          Comments    69 y/o male pt here for 6 wk f/u for HTN ret OU.  No change in [place] OU.  Denies pain, fol, floaters.  BP doing well.  Refresh prn OU.  Latanoprost QHS OU.       Last edited by Bernarda Caffey, MD on 09/04/2019  8:37 PM. (History)    pt is with his daughter today, he states his vision is the same as before  Referring physician: Lawerance Cruel, MD [place],  [place] [address]  HISTORICAL INFORMATION:   Selected notes from the [facility] Referred by Dr. Parke Simmers for retina eval   CURRENT MEDICATIONS: Current Outpatient Medications (Ophthalmic Drugs)  Medication Sig  . latanoprost (XALATAN) 0.005 % ophthalmic solution Place 1 drop into both eyes at bedtime.   No current facility-administered medications for this visit. (Ophthalmic Drugs)   Current Outpatient Medications (Other)  Medication Sig  . acetaminophen (TYLENOL) 500 MG tablet Take 500 mg by mouth as needed.  Marland Kitchen amLODipine (NORVASC) 5 MG tablet Take 1 tablet (5 mg total) by mouth daily.  . carvedilol (COREG) 6.25 MG tablet Take 1 tablet (6.25 mg total) by mouth 2 (two) times daily.  Marland Kitchen ELIQUIS 5 MG TABS tablet Take 1 tablet by mouth twice daily  . Multiple Vitamin (MULTI-VITAMIN PO) Take 1 tablet by mouth daily.  . Omega-3 Fatty Acids (FISH OIL) 1000 MG CAPS Take 1,000 mg by mouth daily.   No current facility-administered medications for this visit. (Other)      REVIEW OF SYSTEMS: ROS     Positive for: Genitourinary, Cardiovascular, Eyes   Negative for: Constitutional, Gastrointestinal, Neurological, Skin, Musculoskeletal, HENT, Endocrine, Respiratory, Psychiatric, Allergic/Imm, Heme/Lymph   Last edited by Matthew Folks, COA on 09/04/2019  8:52 AM. (History)       ALLERGIES No Known Allergies  PAST MEDICAL HISTORY Past Medical History:  Diagnosis Date  . Cataract    Mixed form OU  . Chronic kidney disease    ACE inhibitor and HCTZ DCd in past due to rising Creatinine  . Hypertension    [place] 6/19:  neg for RA stenosis bilat; normal mesenteric arteries  . Hypertensive retinopathy    OU  . Permanent atrial fibrillation    s/p DCCV 04/2017 >> failed // CHADS2-VASc=2 >> Apixaban Rx   Past Surgical History:  Procedure Laterality Date  . CARDIOVERSION N/A 04/30/2017   Procedure: CARDIOVERSION;  Surgeon: Josue Hector, MD;  Location: [facility] ENDOSCOPY;  Service: Cardiovascular;  Laterality: N/A;    FAMILY HISTORY History reviewed. No pertinent family history.  SOCIAL HISTORY Social History   Tobacco Use  . Smoking status: Never Smoker  . Smokeless tobacco: Never Used  Substance Use Topics  . Alcohol use: No  . Drug use: No         OPHTHALMIC EXAM:  Base Eye  Exam    Visual Acuity (Snellen - Linear)      Right Left   Dist [place] 20/20 20/20 -2       Tonometry (Tonopen, 8:53 AM)      Right Left   Pressure 18 15       Pupils      Dark Light Shape React APD   Right 4 3 Round Slow None   Left 4 3 Round Slow None       Visual Fields (Counting fingers)      Left Right    Full Full       Extraocular Movement      Right Left    Full, Ortho Full, Ortho       Neuro/Psych    Oriented x3: Yes   Mood/Affect: Normal       Dilation    Both eyes: 1.0% Mydriacyl, 2.5% Phenylephrine @ 8:53 AM        Slit Lamp and Fundus Exam    Slit Lamp Exam      Right Left   Lids/Lashes Dermatochalasis - upper lid, Meibomian gland dysfunction Dermatochalasis -  upper lid, Meibomian gland dysfunction   Conjunctiva/Sclera Mild Melanosis Mild Melanosis   Cornea Trace Punctate epithelial erosions, mild Debris in tear film Trace Punctate epithelial erosions   Anterior Chamber Deep and quiet Deep and quiet   Iris Round and dilated Round and dilated   Lens 2+ Nuclear sclerosis, 2-3+ Cortical cataract, Pseudoexfoliative material on anterior capsule 2+ Nuclear sclerosis, 2-3+ Cortical cataract   Vitreous Mild Vitreous syneresis, Posterior vitreous detachment Mild Vitreous syneresis, vitreous condensations       Fundus Exam      Right Left   Disc Pink and Sharp, +cupping Pink and Sharp, +cupping   C/D Ratio 0.6 0.6   Macula Flat, Blunted foveal reflex, focal IRH/MA IT to fovea with surrounding edema / cystic changes -- slightly increased from prior, RPE mottling and clumping Flat, good foveal reflex, focal IRH/MA inferior and temporal to fovea   Vessels Vascular attenuation, Tortuous Vascular attenuation, Tortuous   Periphery Attached, No heme  Attached, No heme           IMAGING AND PROCEDURES  Imaging and Procedures for '@TODAY'$ @  OCT, Retina - OU - Both Eyes       Right Eye Quality was good. Central Foveal Thickness: 326. Progression has worsened. Findings include intraretinal fluid, no SRF, abnormal foveal contour (Interval increase in IRF temporal fovea).   Left Eye Quality was good. Central Foveal Thickness: 261. Progression has been stable. Findings include normal foveal contour, no IRF, no SRF (Trace focal IRHM ).   Notes *Images captured and stored on drive  Diagnosis / Impression:  OD: abnormal foveal reflex, interval increase in IRF temporal fovea OS: NFP, no IRF/SRF; tr focal IRHM  Clinical management:  See below  Abbreviations: NFP - Normal foveal profile. CME - cystoid macular edema. PED - pigment epithelial detachment. IRF - intraretinal fluid. SRF - subretinal fluid. EZ - ellipsoid zone. ERM - epiretinal membrane. ORA - outer  retinal atrophy. ORT - outer retinal tubulation. SRHM - subretinal hyper-reflective material                 ASSESSMENT/PLAN:    ICD-10-CM   1. Hypertensive retinopathy of both eyes  H35.033 CANCELED: Intravitreal Injection, Pharmacologic Agent - OD - Right Eye  2. Combined forms of age-related cataract of both eyes  H25.813   3. Retinal edema  H35.81  OCT, Retina - OU - Both Eyes  4. 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 Retinal hemorrhage of both eyes  H35.63   6. Essential hypertension  I10    1-4. Hypertensive retinopathy with retinal hemorrhage OU and retinal edema OD  - BCVA remains good (20/20 OU) and pt essentially asymptomatic  - original OCT showed focal IRF/IRHM OD; OS with mild IRHM, no edema  - FA (01.14.21) shows focal, parafoveal hyperfluorescent leakage OU (OD >> OS)  - BP at initial visit 140s / 99 and 96 in R and L arms respectively  - suspect hemorrhage and edema related to HTN and use of eliquis for A fib  - BP meds adjusted and BP stably improved today -- 121 / 70-80s   - exam and OCT today shows mild interval increase in IRF and retinal edema OD, however pt remains 20/20 and asymptomatic OD  - no acute ocular intervention recommended at this time  - discussed possibility of anti-VEGF therapy if edema or vision worsens   - recommend f/u 2 months, sooner prn, with DFE/OCT  5. 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  - discussed diagnosis and progression  - not yet visually significant  - monitor for now  6. Pseudoexfoliation Syndrome OD  - pseudoexfoliation of lens OD  Ophthalmic Meds Ordered this visit:  No orders of the defined types were placed in this encounter.      Return in about 2 months (around 11/04/2019) for f/u HTN ret OU, DFE, OCT.  There are no Patient Instructions on file for this visit.   Explained the diagnoses, plan,  and follow up with the patient and they expressed understanding.  Patient expressed understanding of the importance of proper follow up care.   This document serves as a record of services personally performed by Gardiner Sleeper, MD, PhD. It was created on their behalf by Leeann Must, [place], a certified ophthalmic assistant. The creation of this record is the provider's dictation and/or activities during the visit.    Electronically signed by: Leeann Must, COA '@TODAY'$ @ 8:43 PM   This document serves as a record of services personally performed by Gardiner Sleeper, MD, PhD. It was created on their behalf by Ernest Mallick, OA, an ophthalmic assistant. The creation of this record is the provider's dictation and/or activities during the visit.    Electronically signed by: Ernest Mallick, OA 04.01.2021 8:43 PM   Gardiner Sleeper, M.D., Ph.D. Diseases & Surgery of the Retina and Vitreous [facility]  I have reviewed the above documentation for accuracy and completeness, and I agree with the above. Gardiner Sleeper, M.D., Ph.D. 09/04/19 8:43 PM   Abbreviations: M myopia (nearsighted); A astigmatism; H hyperopia (farsighted); P presbyopia; Mrx spectacle prescription;  CTL contact lenses; OD right eye; OS left eye; OU both eyes  XT exotropia; ET esotropia; PEK punctate epithelial keratitis; PEE punctate epithelial erosions; DES dry eye syndrome; MGD meibomian gland dysfunction; ATs artificial tears; PFAT's preservative free artificial tears; [place] nuclear sclerotic cataract; PSC posterior subcapsular cataract; ERM epi-retinal membrane; PVD posterior vitreous detachment; RD retinal detachment; DM diabetes mellitus; DR diabetic retinopathy; NPDR non-proliferative diabetic retinopathy; PDR proliferative diabetic retinopathy; CSME clinically significant macular edema; DME diabetic macular edema; dbh dot blot hemorrhages; CWS cotton wool spot; POAG primary open angle glaucoma; C/D cup-to-disc  ratio; HVF humphrey visual field;  GVF goldmann visual field; OCT optical coherence tomography; IOP intraocular pressure; BRVO Branch retinal vein occlusion; CRVO central retinal vein occlusion; CRAO central retinal artery occlusion; BRAO branch retinal artery occlusion; RT retinal tear; SB scleral buckle; PPV pars plana vitrectomy; VH Vitreous hemorrhage; PRP panretinal laser photocoagulation; IVK intravitreal kenalog; VMT vitreomacular traction; MH Macular hole;  NVD neovascularization of the disc; NVE neovascularization elsewhere; AREDS age related eye disease study; ARMD age related macular degeneration; POAG primary open angle glaucoma; EBMD epithelial/anterior basement membrane dystrophy; ACIOL anterior chamber intraocular lens; IOL intraocular lens; PCIOL posterior chamber intraocular lens; Phaco/IOL phacoemulsification with intraocular lens placement; [place] photorefractive keratectomy; LASIK laser assisted in situ keratomileusis; HTN hypertension; DM diabetes mellitus; COPD chronic obstructive pulmonary disease

## 2019-09-04 ENCOUNTER — Ambulatory Visit (INDEPENDENT_AMBULATORY_CARE_PROVIDER_SITE_OTHER): Payer: Medicare Other | Admitting: Ophthalmology

## 2019-09-04 ENCOUNTER — Encounter (INDEPENDENT_AMBULATORY_CARE_PROVIDER_SITE_OTHER): Payer: Self-pay | Admitting: Ophthalmology

## 2019-09-04 VITALS — BP 121/73 | HR 70

## 2019-09-04 DIAGNOSIS — H35033 Hypertensive retinopathy, bilateral: Secondary | ICD-10-CM

## 2019-09-04 DIAGNOSIS — H25813 Combined forms of age-related cataract, bilateral: Secondary | ICD-10-CM

## 2019-09-04 DIAGNOSIS — H3581 Retinal edema: Secondary | ICD-10-CM | POA: Diagnosis not present

## 2019-09-04 DIAGNOSIS — H3563 Retinal hemorrhage, bilateral: Secondary | ICD-10-CM

## 2019-09-04 DIAGNOSIS — I10 Essential (primary) hypertension: Secondary | ICD-10-CM

## 2019-09-04 DIAGNOSIS — H268 Other specified cataract: Secondary | ICD-10-CM

## 2019-09-18 IMAGING — CR DG CHEST 2V
2 series · 2 of 2 positions shown · non-contrast
Comparison: None.

CLINICAL DATA: 66-year-old male with hypertension.

EXAM:
CHEST  2 VIEW

[chest ap]
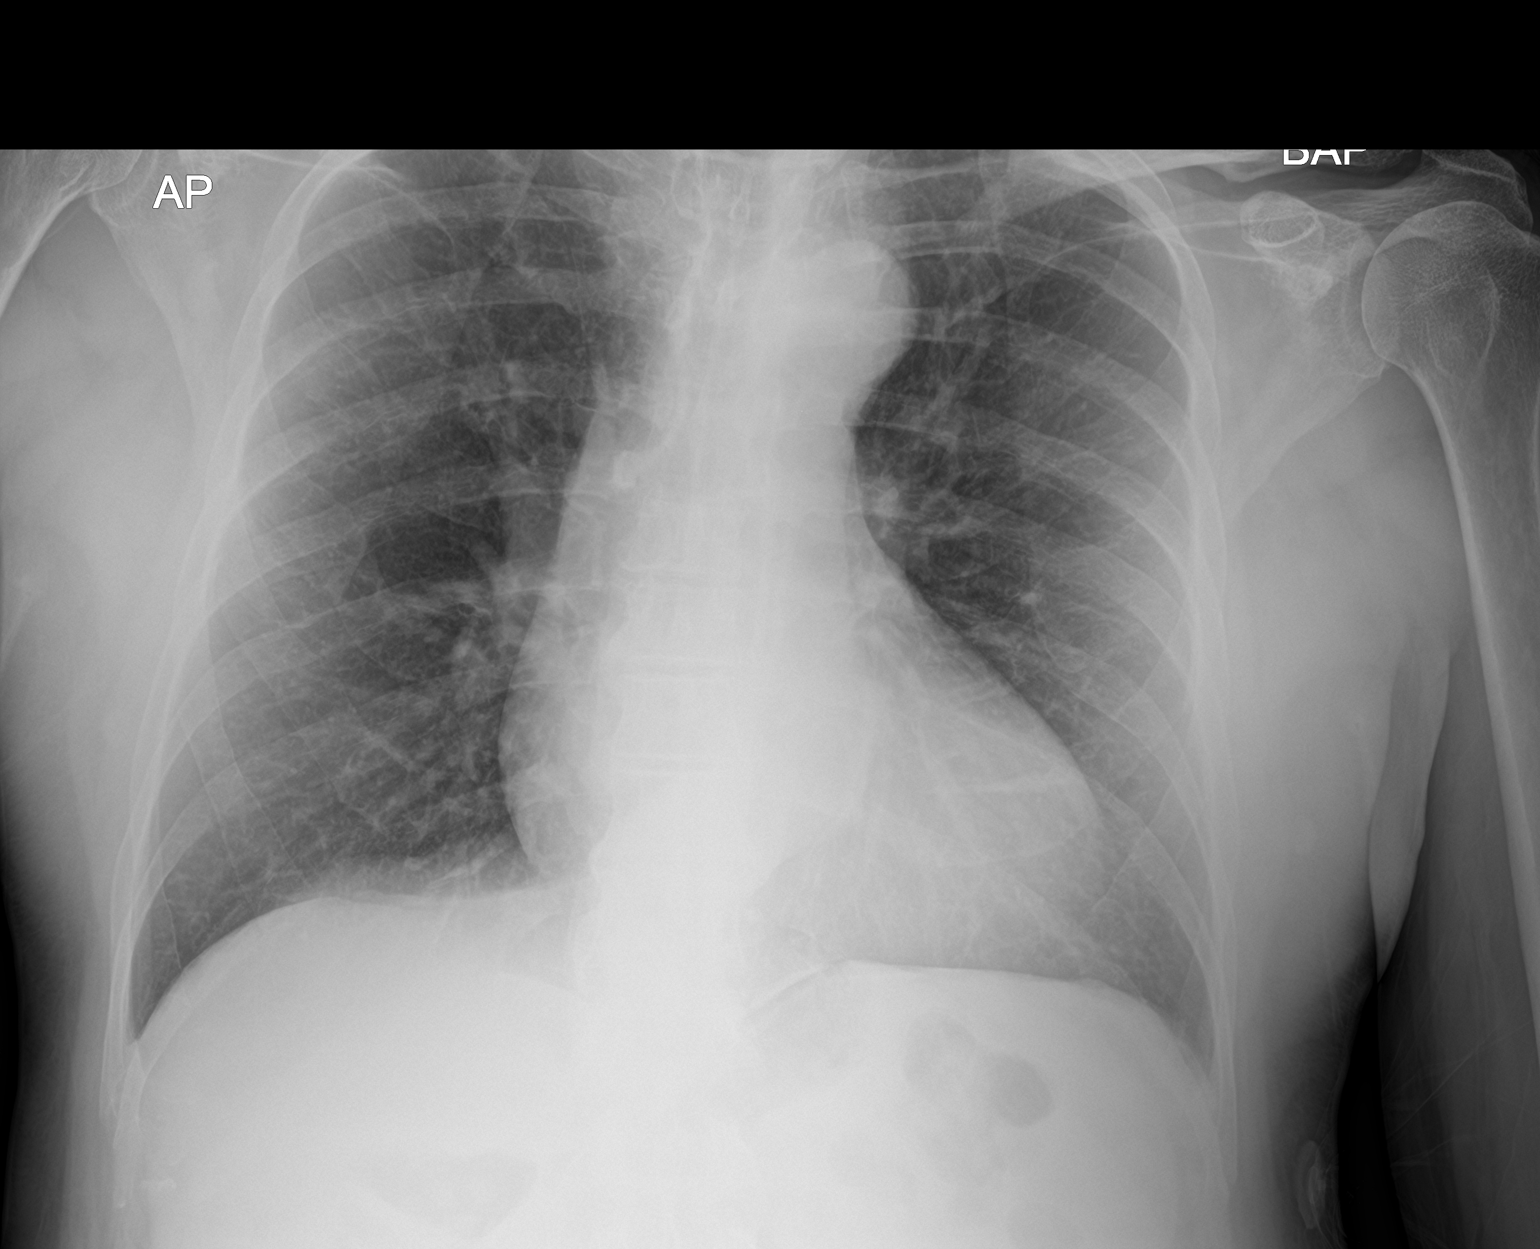

[chest lat]
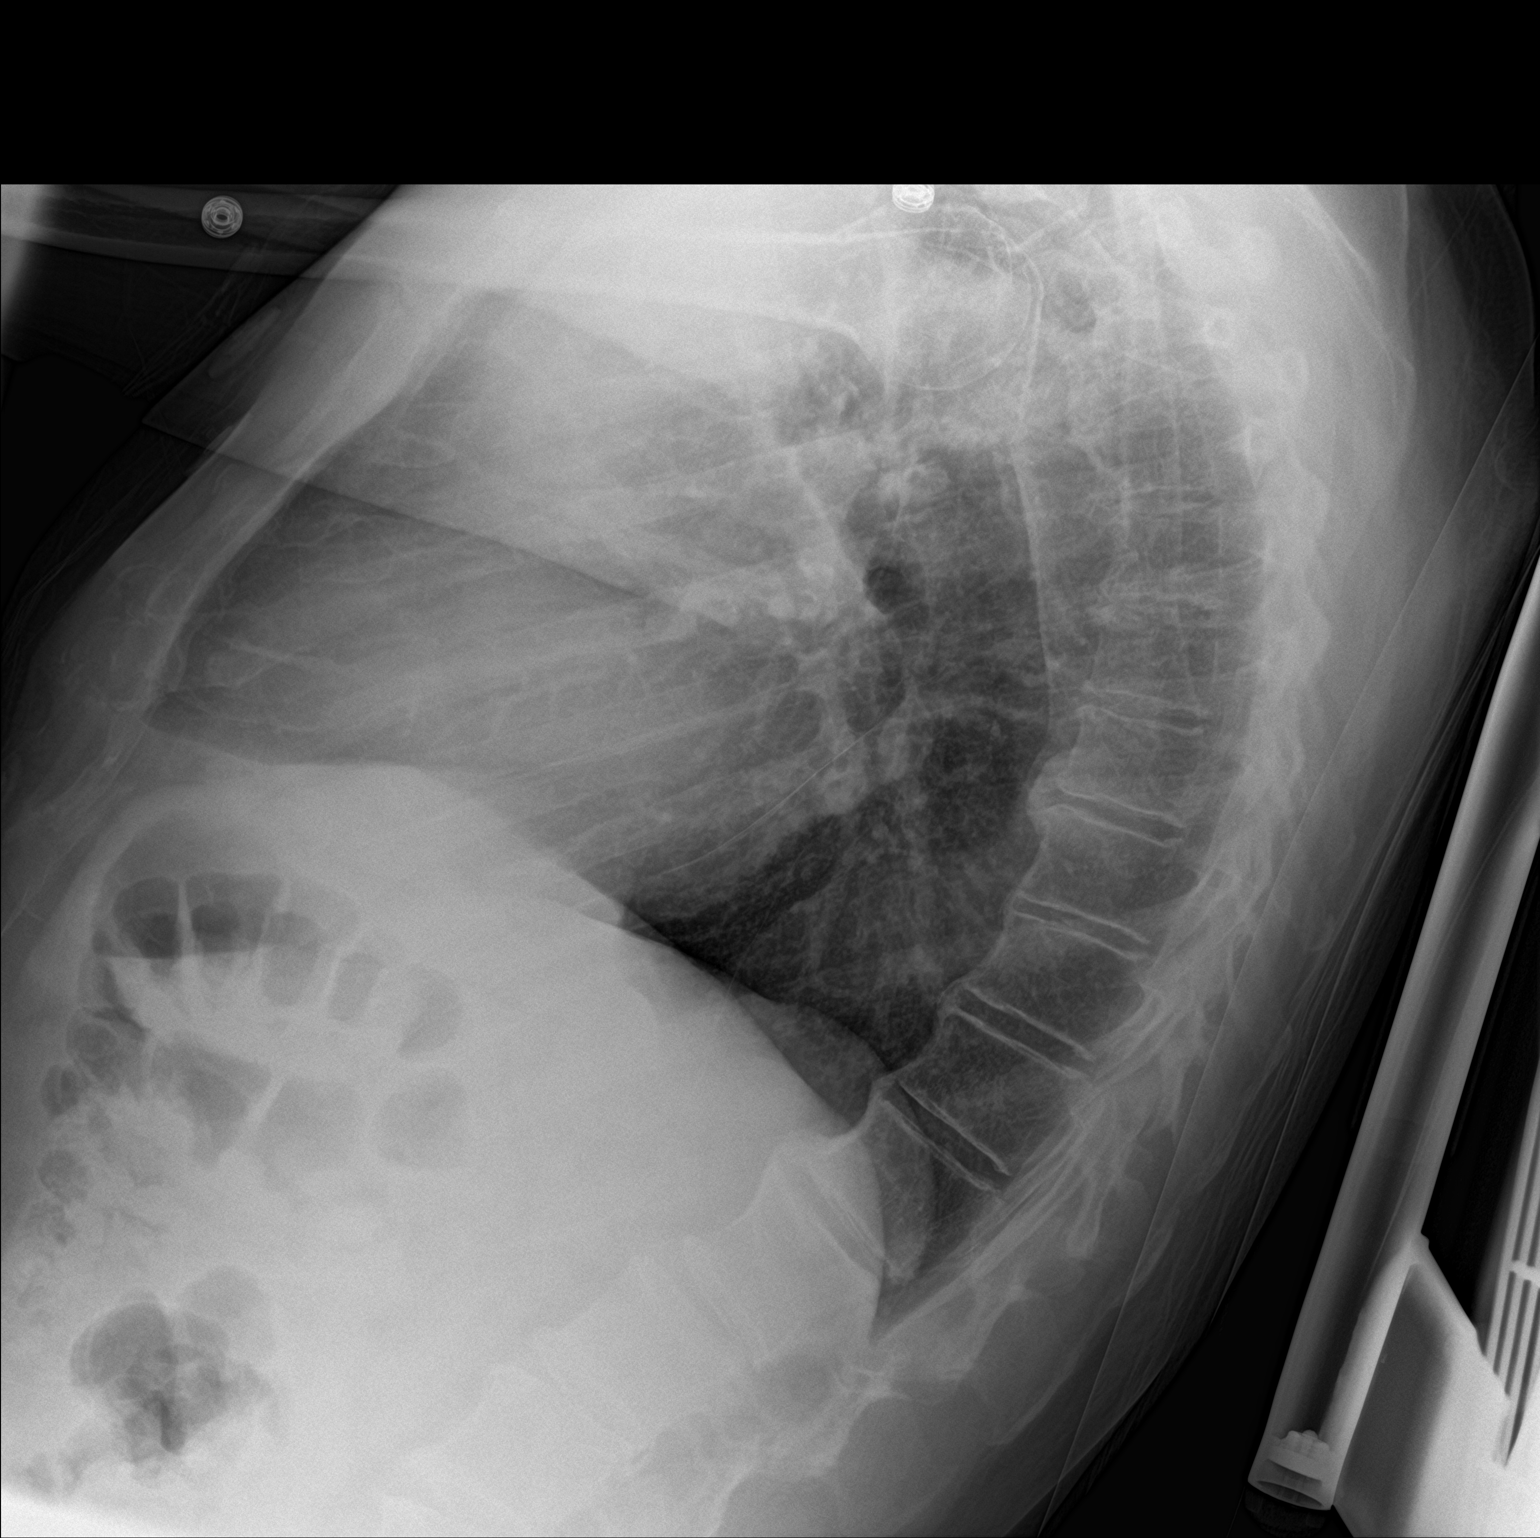

[2 of 2 positions shown; findings below may reference images not displayed]

FINDINGS: The lungs are clear and negative for focal airspace consolidation,
pulmonary edema or suspicious pulmonary nodule. No pleural effusion
or pneumothorax. Mild cardiomegaly. No acute fracture or lytic or
blastic osseous lesions. The visualized upper abdominal bowel gas
pattern is unremarkable.
IMPRESSION: No active cardiopulmonary disease.

Mild cardiomegaly.

## 2019-09-23 ENCOUNTER — Other Ambulatory Visit: Payer: Self-pay | Admitting: Physician Assistant

## 2019-11-03 NOTE — Progress Notes (Signed)
[facility] Note  11/04/2019     CHIEF COMPLAINT Patient presents for Retina Follow Up   HISTORY OF PRESENT ILLNESS: Billy Harrell is a 69 y.o. male who presents to the clinic today for:   HPI    Retina Follow Up    Patient presents with  Other.  In both eyes.  This started 2 months ago.  Severity is moderate.  I, the attending physician,  performed the HPI with the patient and updated documentation appropriately.          Comments    Patient here for 2 months retina follow up for HTN RET OU. Patient states vision doing ok. Feels like something in OD. Only sometimes not all the time.       Last edited by Bernarda Caffey, MD on 11/06/2019 11:16 PM. (History)    pt is with his daughter today,   Referring physician: Lawerance Cruel, MD [place],  [place] [address]  HISTORICAL INFORMATION:   Selected notes from the MEDICAL RECORD NUMBER Referred by Dr. Parke Simmers for retina eval   CURRENT MEDICATIONS: Current Outpatient Medications (Ophthalmic Drugs)  Medication Sig   latanoprost (XALATAN) 0.005 % ophthalmic solution Place 1 drop into both eyes at bedtime.   No current facility-administered medications for this visit. (Ophthalmic Drugs)   Current Outpatient Medications (Other)  Medication Sig   acetaminophen (TYLENOL) 500 MG tablet Take 500 mg by mouth as needed.   amLODipine (NORVASC) 5 MG tablet Take 1 tablet (5 mg total) by mouth daily.   carvedilol (COREG) 6.25 MG tablet Take 1 tablet by mouth twice daily   ELIQUIS 5 MG TABS tablet Take 1 tablet by mouth twice daily   Multiple Vitamin (MULTI-VITAMIN PO) Take 1 tablet by mouth daily.   Omega-3 Fatty Acids (FISH OIL) 1000 MG CAPS Take 1,000 mg by mouth daily.   No current facility-administered medications for this visit. (Other)      REVIEW OF SYSTEMS: ROS    Positive for: Eyes   Negative for: Constitutional, Gastrointestinal, Neurological, Skin, Genitourinary,  Musculoskeletal, HENT, Endocrine, Cardiovascular, Respiratory, Psychiatric, Allergic/Imm, Heme/Lymph   Last edited by Theodore Demark, COA on 11/04/2019  9:23 AM. (History)       ALLERGIES No Known Allergies  PAST MEDICAL HISTORY Past Medical History:  Diagnosis Date   Cataract    Mixed form OU   Chronic kidney disease    ACE inhibitor and HCTZ DCd in past due to rising Creatinine   Hypertension    [place] 6/19:  neg for RA stenosis bilat; normal mesenteric arteries   Hypertensive retinopathy    OU   Permanent atrial fibrillation    s/p DCCV 04/2017 >> failed // CHADS2-VASc=2 >> Apixaban Rx   Past Surgical History:  Procedure Laterality Date   CARDIOVERSION N/A 04/30/2017   Procedure: CARDIOVERSION;  Surgeon: Josue Hector, MD;  Location: [place];  Service: Cardiovascular;  Laterality: N/A;    FAMILY HISTORY History reviewed. No pertinent family history.  SOCIAL HISTORY Social History   Tobacco Use   Smoking status: Never Smoker   Smokeless tobacco: Never Used  Substance Use Topics   Alcohol use: No   Drug use: No         OPHTHALMIC EXAM:  Base Eye Exam    Visual Acuity (Snellen - Linear)      Right Left   Dist [place] 20/20 -1 20/25 +1   Dist ph   20/20 -1  Tonometry (Tonopen, 9:21 AM)      Right Left   Pressure 18 18       Pupils      Dark Light Shape React APD   Right 4 3 Round Slow None   Left 4 3 Round Slow None       Visual Fields (Counting fingers)      Left Right    Full Full       Extraocular Movement      Right Left    Full, Ortho Full, Ortho       Neuro/Psych    Oriented x3: Yes   Mood/Affect: Normal       Dilation    Both eyes: 1.0% Mydriacyl, 2.5% Phenylephrine @ 9:21 AM        Slit Lamp and Fundus Exam    Slit Lamp Exam      Right Left   Lids/Lashes Dermatochalasis - upper lid, Meibomian gland dysfunction Dermatochalasis - upper lid, Meibomian gland dysfunction   Conjunctiva/Sclera Mild Melanosis  Mild Melanosis   Cornea Trace Punctate epithelial erosions, mild Debris in tear film Trace Punctate epithelial erosions   Anterior Chamber Deep and quiet Deep and quiet   Iris Round and dilated Round and dilated   Lens 2+ Nuclear sclerosis, 2-3+ Cortical cataract, Pseudoexfoliative material on anterior capsule 2+ Nuclear sclerosis, 2-3+ Cortical cataract, early pseudoexfoliation    Vitreous Mild Vitreous syneresis, Posterior vitreous detachment Mild Vitreous syneresis, vitreous condensations       Fundus Exam      Right Left   Disc Pink and Sharp, +cupping Pink and Sharp, +cupping   C/D Ratio 0.6 0.6   Macula Flat, Blunted foveal reflex, focal IRH/MA IT to fovea with surrounding cystic changes -- slightly improved from prior, RPE mottling and clumping Flat, blunted foveal reflex, focal IRH/MA inferior and temporal to fovea   Vessels Vascular attenuation, Tortuous Vascular attenuation, Tortuous, AV crossing changes   Periphery Attached, No heme  Attached, No heme           IMAGING AND PROCEDURES  Imaging and Procedures for '@TODAY'$ @  OCT, Retina - OU - Both Eyes       Right Eye Quality was good. Central Foveal Thickness: 295. Progression has improved. Findings include intraretinal fluid, no SRF, abnormal foveal contour (Interval improvement in IRF and foveal profile ).   Left Eye Quality was good. Central Foveal Thickness: 265. Progression has been stable. Findings include normal foveal contour, no IRF, no SRF (Trace focal IRHM ).   Notes *Images captured and stored on drive  Diagnosis / Impression:  OD: abnormal foveal reflex, Interval improvement in IRF and foveal profile  OS: NFP, no IRF/SRF; tr focal IRHM  Clinical management:  See below  Abbreviations: NFP - Normal foveal profile. CME - cystoid macular edema. PED - pigment epithelial detachment. IRF - intraretinal fluid. SRF - subretinal fluid. EZ - ellipsoid zone. ERM - epiretinal membrane. ORA - outer retinal atrophy.  ORT - outer retinal tubulation. SRHM - subretinal hyper-reflective material                 ASSESSMENT/PLAN:    ICD-10-CM   1. Hypertensive retinopathy of both eyes  H35.033   2. Essential hypertension  I10   3. Retinal hemorrhage of both eyes  H35.63   4. Combined forms of age-related cataract of both eyes  H25.813   5. Retinal edema  H35.81 OCT, Retina - OU - Both Eyes  6. Pseudoexfoliation of lens capsule,  both eyes  H26.8    1-4. Hypertensive retinopathy with retinal hemorrhage OU and retinal edema OD  - BCVA remains good (20/20 OU) and pt essentially asymptomatic  - original OCT showed focal IRF/IRHM OD; OS with mild IRHM, no edema  - FA (01.14.21) shows focal, parafoveal hyperfluorescent leakage OU (OD >> OS)  - BP at initial visit 140s / 99 and 96 in R and L arms respectively  - suspect hemorrhage and edema related to HTN and use of eliquis for A fib  - BP meds adjusted and BP stably improved today -- 120s / 70s   - exam and OCT today shows mild interval improvement in IRF and cystic changes OD, and pt remains 20/20 and asymptomatic OD  - no acute ocular intervention recommended at this time  - discussed possibility of anti-VEGF therapy if edema or vision worsens   - recommend f/u 3-4 months, sooner prn, with DFE/OCT  5. 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  - discussed diagnosis and progression  - not yet visually significant  - monitor for now  6. 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      Return for f/u 3-4 months, HTN ret, OCT.  There are no Patient Instructions on file for this visit.   Explained the diagnoses, plan, and follow up with the patient and they expressed  understanding.  Patient expressed understanding of the importance of proper follow up care.   This document serves as a record of services personally performed by Gardiner Sleeper, MD, PhD. It was created on their behalf by Estill Bakes, COT an ophthalmic technician. The creation of this record is the provider's dictation and/or activities during the visit.    Electronically signed by: Estill Bakes, COT 11/03/19 @ 11:28 PM   This document serves as a record of services personally performed by Gardiner Sleeper, MD, PhD. It was created on their behalf by Ernest Mallick, OA, an ophthalmic assistant. The creation of this record is the provider's dictation and/or activities during the visit.    Electronically signed by: Ernest Mallick, OA 06.01.2021 11:28 PM  Gardiner Sleeper, M.D., Ph.D. Diseases & Surgery of the Retina and [place] 11/04/2019   I have reviewed the above documentation for accuracy and completeness, and I agree with the above. Gardiner Sleeper, M.D., Ph.D. 11/06/19 11:28 PM   Abbreviations: M myopia (nearsighted); A astigmatism; H hyperopia (farsighted); P presbyopia; Mrx spectacle prescription;  CTL contact lenses; OD right eye; OS left eye; OU both eyes  XT exotropia; ET esotropia; PEK punctate epithelial keratitis; PEE punctate epithelial erosions; DES dry eye syndrome; MGD meibomian gland dysfunction; ATs artificial tears; PFAT's preservative free artificial tears; [place] nuclear sclerotic cataract; PSC posterior subcapsular cataract; ERM epi-retinal membrane; PVD posterior vitreous detachment; RD retinal detachment; DM diabetes mellitus; DR diabetic retinopathy; NPDR non-proliferative diabetic retinopathy; PDR proliferative diabetic retinopathy; CSME clinically significant macular edema; DME diabetic macular edema; dbh dot blot hemorrhages; CWS cotton wool spot; POAG primary open angle glaucoma; C/D cup-to-disc ratio; HVF humphrey visual field; GVF  goldmann visual field; OCT optical coherence tomography; IOP intraocular pressure; BRVO Branch retinal vein occlusion; CRVO central retinal vein occlusion; CRAO central retinal artery occlusion;  occlusion; BRAO branch retinal artery occlusion; RT retinal tear; SB scleral buckle; PPV pars plana vitrectomy; VH Vitreous hemorrhage; PRP panretinal laser photocoagulation; IVK intravitreal kenalog; VMT vitreomacular traction; MH Macular hole;  NVD neovascularization of the disc; NVE neovascularization elsewhere; AREDS age related eye disease study; ARMD age related macular degeneration; POAG primary open angle glaucoma; EBMD epithelial/anterior basement membrane dystrophy; ACIOL anterior chamber intraocular lens; IOL intraocular lens; PCIOL posterior chamber intraocular lens; Phaco/IOL phacoemulsification with intraocular lens placement; [place] photorefractive keratectomy; LASIK laser assisted in situ keratomileusis; HTN hypertension; DM diabetes mellitus; COPD chronic obstructive pulmonary disease

## 2019-11-04 ENCOUNTER — Ambulatory Visit (INDEPENDENT_AMBULATORY_CARE_PROVIDER_SITE_OTHER): Payer: Medicare Other | Admitting: Ophthalmology

## 2019-11-04 ENCOUNTER — Encounter (INDEPENDENT_AMBULATORY_CARE_PROVIDER_SITE_OTHER): Payer: Self-pay | Admitting: Ophthalmology

## 2019-11-04 ENCOUNTER — Other Ambulatory Visit: Payer: Self-pay

## 2019-11-04 VITALS — BP 120/73

## 2019-11-04 DIAGNOSIS — H3563 Retinal hemorrhage, bilateral: Secondary | ICD-10-CM | POA: Diagnosis not present

## 2019-11-04 DIAGNOSIS — H25813 Combined forms of age-related cataract, bilateral: Secondary | ICD-10-CM | POA: Diagnosis not present

## 2019-11-04 DIAGNOSIS — H268 Other specified cataract: Secondary | ICD-10-CM

## 2019-11-04 DIAGNOSIS — H3581 Retinal edema: Secondary | ICD-10-CM

## 2019-11-04 DIAGNOSIS — I10 Essential (primary) hypertension: Secondary | ICD-10-CM

## 2019-11-04 DIAGNOSIS — H35033 Hypertensive retinopathy, bilateral: Secondary | ICD-10-CM | POA: Diagnosis not present

## 2020-01-13 ENCOUNTER — Other Ambulatory Visit (HOSPITAL_COMMUNITY): Payer: Self-pay | Admitting: Physician Assistant

## 2020-01-14 NOTE — Telephone Encounter (Signed)
Eliquis 5mg  refill request received. Patient is 69 years old, weight-82.3kg, Crea-1.16 on 05/15/2019 via [facility] from [facility] PCP, Diagnosis-Afib, and last seen by [facility] on 07/21/2019. Dose is appropriate based on dosing criteria. Will send in refill to requested pharmacy.

## 2020-01-26 NOTE — Progress Notes (Signed)
Cardiology Office Note:    Date:  01/27/2020   ID:  Landis Dowdy, DOB January 20, 1951, MRN [id number]  PCP:  Daisy Floro, MD  Cardiologist:  Dietrich Pates, MD   Electrophysiologist:  None   Referring MD: Daisy Floro, MD   Chief Complaint:  Follow-up (AFib, HTN)    Patient Profile:    Billy Harrell is a 69 y.o. male with:   Hypertension  Permanent atrial fibrillation ? S/p DCCV 11/18>> ERAF ? CHADS2-VASc=2 (HTN, age) >>Apixaban  Chronic kidney disease  ? ACE inhibitor and HCTZ d/c'd in the past due to worsening Creatinine ? RA [place] neg for RAS 11/2017  Prior CV studies: 24 Hr BP Monitor 07/10/2019 24 hour BP monitor Average BP through period is 118/79. mmHg Lowest systolic 89/ Mm Hg Highest systolic 147/ mm Hg  Renal Artery [place] 11/07/17 No RAS bilat; normal celiac and SMA  Echo 04/06/17 Mild focal basal septal hypertrophy, EF 55-60, no RWMA, mild AI, mildly dilated Ao root (42 mm), mild MR, mod LAE, mod RAE, mild TR, PASP 32  History of Present Illness:    Mr. Bracewell was last seen in 07/2019.  He returns for follow up.  He is here alone.  He has been doing well without chest discomfort, significant shortness of breath, syncope.  His blood pressures at home have been optimal.  Past Medical History:  Diagnosis Date   Cataract    Mixed form OU   Chronic kidney disease    ACE inhibitor and HCTZ DCd in past due to rising Creatinine   Hypertension    [place] 6/19:  neg for RA stenosis bilat; normal mesenteric arteries   Hypertensive retinopathy    OU   Permanent atrial fibrillation    s/p DCCV 04/2017 >> failed // CHADS2-VASc=2 >> Apixaban Rx    Current Medications: Current Meds  Medication Sig   acetaminophen (TYLENOL) 500 MG tablet Take 500 mg by mouth as needed for mild pain.    amLODipine (NORVASC) 5 MG tablet Take 1 tablet (5 mg total) by mouth daily.   carvedilol (COREG) 6.25 MG tablet Take 1 tablet by mouth twice daily   ELIQUIS 5 MG TABS tablet  Take 1 tablet by mouth twice daily   latanoprost (XALATAN) 0.005 % ophthalmic solution Place 1 drop into both eyes at bedtime.   Multiple Vitamin (MULTI-VITAMIN PO) Take 1 tablet by mouth daily.   Omega-3 Fatty Acids (FISH OIL) 1000 MG CAPS Take 1,000 mg by mouth as needed (for added supplement).      Allergies:   Patient has no known allergies.   Social History   Tobacco Use   Smoking status: Never Smoker   Smokeless tobacco: Never Used  Substance Use Topics   Alcohol use: No   Drug use: No     Family Hx: The patient's family history is not on file.  Review of Systems  Gastrointestinal: Negative for hematemesis, hematochezia and melena.  Genitourinary: Negative for hematuria.     EKGs/Labs/Other Test Reviewed:    EKG:  EKG is   ordered today.  The ekg ordered today demonstrates atrial fibrillation, HR 78, normal axis, early repolarization, QTC 446, no change from prior tracing  Recent Labs: Labs from PCP [facility]) personally reviewed and interpreted: 05/15/2019 - total cholesterol 185, HDL 43, LDL 130, triglycerides 66, creatinine 1.16, potassium 4.8  Recent Lipid Panel No results found for: CHOL, TRIG, HDL, CHOLHDL, LDLCALC, LDLDIRECT  Physical Exam:    VS:  BP 110/76  Pulse 78    Ht 5\' 10"  (1.778 m)    Wt 185 lb (83.9 kg)    SpO2 96%    BMI 26.54 kg/m     Wt Readings from Last 3 Encounters:  01/27/20 185 lb (83.9 kg)  07/21/19 181 lb 6.4 oz (82.3 kg)  01/29/19 176 lb 6.4 oz (80 kg)     Constitutional:      Appearance: Healthy appearance. Not in distress.  Neck:     Vascular: JVD normal.  Pulmonary:     Effort: Pulmonary effort is normal.     Breath sounds: No wheezing. No rales.  Cardiovascular:     Normal rate. Irregularly irregular rhythm. Normal S1. Normal S2.     Murmurs: There is no murmur.  Edema:    Peripheral edema absent.  Abdominal:     Palpations: Abdomen is soft. There is no hepatomegaly.  Skin:    General: Skin is warm and dry.    Neurological:     General: No focal deficit present.     Mental Status: Alert and oriented to person, place and time.     Cranial Nerves: Cranial nerves are intact.       ASSESSMENT & PLAN:    1. Essential hypertension The patient's blood pressure is controlled on his current regimen.  Continue current dose of amlodipine, carvedilol.   2. Permanent atrial fibrillation (HCC) Rate is controlled.  He is tolerating anticoagulation well.  Obtain follow-up BMET, CBC today.  Continue current dose of Apixaban.  Dispo:  Return in about 1 year (around 01/26/2021) for Routine Follow Up, w/ Dr. 01/28/2021, or Tenny Craw, PA-C, in person.   Medication Adjustments/Labs and Tests Ordered: Current medicines are reviewed at length with the patient today.  Concerns regarding medicines are outlined above.  Tests Ordered: Orders Placed This Encounter  Procedures   CBC   Basic metabolic panel   EKG 12-Lead   Medication Changes: No orders of the defined types were placed in this encounter.   Signed, Tereso Newcomer, PA-C  01/27/2020 8:30 AM    [facility] [address] [place], [place], [place]  [place] Phone: [phone]; Fax: [phone]

## 2020-01-27 ENCOUNTER — Other Ambulatory Visit: Payer: Self-pay

## 2020-01-27 ENCOUNTER — Encounter: Payer: Self-pay | Admitting: Physician Assistant

## 2020-01-27 ENCOUNTER — Ambulatory Visit: Payer: Medicare Other | Admitting: Physician Assistant

## 2020-01-27 VITALS — BP 110/76 | HR 78 | Ht 70.0 in | Wt 185.0 lb

## 2020-01-27 DIAGNOSIS — I4821 Permanent atrial fibrillation: Secondary | ICD-10-CM

## 2020-01-27 DIAGNOSIS — I10 Essential (primary) hypertension: Secondary | ICD-10-CM

## 2020-01-27 LAB — CBC
Hematocrit: 37.2 % — ABNORMAL LOW (ref 37.5–51.0)
Hemoglobin: 12.4 g/dL — ABNORMAL LOW (ref 13.0–17.7)
MCH: 28.7 pg (ref 26.6–33.0)
MCHC: 33.3 g/dL (ref 31.5–35.7)
MCV: 86 fL (ref 79–97)
Platelets: 148 10*3/uL — ABNORMAL LOW (ref 150–450)
RBC: 4.32 x10E6/uL (ref 4.14–5.80)
RDW: 13.5 % (ref 11.6–15.4)
WBC: 4.1 10*3/uL (ref 3.4–10.8)

## 2020-01-27 LAB — BASIC METABOLIC PANEL
BUN/Creatinine Ratio: 18 (ref 10–24)
BUN: 24 mg/dL (ref 8–27)
CO2: 21 mmol/L (ref 20–29)
Calcium: 8.9 mg/dL (ref 8.6–10.2)
Chloride: 105 mmol/L (ref 96–106)
Creatinine, Ser: 1.33 mg/dL — ABNORMAL HIGH (ref 0.76–1.27)
GFR calc Af Amer: 62 mL/min/{1.73_m2} (ref >59)
GFR calc non Af Amer: 54 mL/min/{1.73_m2} — ABNORMAL LOW (ref >59)
Glucose: 94 mg/dL (ref 65–99)
Potassium: 4 mmol/L (ref 3.5–5.2)
Sodium: 138 mmol/L (ref 134–144)

## 2020-01-27 NOTE — Patient Instructions (Addendum)
Medication Instructions:  Your physician recommends that you continue on your current medications as directed. Please refer to the Current Medication list given to you today.  *If you need a refill on your cardiac medications before your next appointment, please call your pharmacy*  Lab Work: You will have labs drawn today: BMET/CBC  Testing/Procedures: None ordered today  Follow-Up: At [facility], you and your health needs are our priority.  As part of our continuing mission to provide you with exceptional heart care, we have created designated Provider Care Teams.  These Care Teams include your primary Cardiologist (physician) and Advanced Practice Providers (APPs -  Physician Assistants and Nurse Practitioners) who all work together to provide you with the care you need, when you need it.  We recommend signing up for the patient portal called "MyChart".  Sign up information is provided on this After Visit Summary.  MyChart is used to connect with patients for Virtual Visits (Telemedicine).  Patients are able to view lab/test results, encounter notes, upcoming appointments, etc.  Non-urgent messages can be sent to your provider as well.   To learn more about what you can do with MyChart, go to [email or web address].    Your next appointment:   12 month(s)  The format for your next appointment:   In Person  Provider:   You may see Dietrich Pates, MD or Tereso Newcomer, PA-C

## 2020-01-30 ENCOUNTER — Other Ambulatory Visit: Payer: Self-pay

## 2020-01-30 DIAGNOSIS — I4819 Other persistent atrial fibrillation: Secondary | ICD-10-CM

## 2020-01-30 DIAGNOSIS — I10 Essential (primary) hypertension: Secondary | ICD-10-CM

## 2020-01-30 NOTE — Progress Notes (Signed)
The patients daughter Marine has been notified of the result and verbalized understanding. Ok to speak with Jearld Adjutant per patient DPR. All questions (if any) were answered. Patient will come back on 02/13/20 for repeat labs. Lajoyce Corners, CMA 01/30/2020 12:04 PM

## 2020-02-13 ENCOUNTER — Other Ambulatory Visit: Payer: Medicare Other | Admitting: *Deleted

## 2020-02-13 ENCOUNTER — Other Ambulatory Visit: Payer: Self-pay

## 2020-02-13 DIAGNOSIS — I4819 Other persistent atrial fibrillation: Secondary | ICD-10-CM

## 2020-02-13 DIAGNOSIS — I10 Essential (primary) hypertension: Secondary | ICD-10-CM

## 2020-02-13 LAB — BASIC METABOLIC PANEL
BUN/Creatinine Ratio: 16 (ref 10–24)
BUN: 21 mg/dL (ref 8–27)
CO2: 24 mmol/L (ref 20–29)
Calcium: 8.9 mg/dL (ref 8.6–10.2)
Chloride: 100 mmol/L (ref 96–106)
Creatinine, Ser: 1.35 mg/dL — ABNORMAL HIGH (ref 0.76–1.27)
GFR calc Af Amer: 61 mL/min/{1.73_m2} (ref >59)
GFR calc non Af Amer: 53 mL/min/{1.73_m2} — ABNORMAL LOW (ref >59)
Glucose: 112 mg/dL — ABNORMAL HIGH (ref 65–99)
Potassium: 4.6 mmol/L (ref 3.5–5.2)
Sodium: 138 mmol/L (ref 134–144)

## 2020-02-13 LAB — CBC
Hematocrit: 39.9 % (ref 37.5–51.0)
Hemoglobin: 13.9 g/dL (ref 13.0–17.7)
MCH: 29.3 pg (ref 26.6–33.0)
MCHC: 34.8 g/dL (ref 31.5–35.7)
MCV: 84 fL (ref 79–97)
Platelets: 191 10*3/uL (ref 150–450)
RBC: 4.74 x10E6/uL (ref 4.14–5.80)
RDW: 12.8 % (ref 11.6–15.4)
WBC: 4.6 10*3/uL (ref 3.4–10.8)

## 2020-03-15 ENCOUNTER — Encounter (INDEPENDENT_AMBULATORY_CARE_PROVIDER_SITE_OTHER): Payer: Medicare Other | Admitting: Ophthalmology

## 2020-03-22 NOTE — Progress Notes (Signed)
[facility] - Clinic Note  03/23/2020     CHIEF COMPLAINT Patient presents for Retina Follow Up   HISTORY OF PRESENT ILLNESS: Billy Harrell is a 69 y.o. male who presents to the clinic today for:   HPI    Retina Follow Up    Patient presents with  Other.  In both eyes.  This started months ago.  Severity is moderate.  Duration of months.  Since onset it is stable.  I, the attending physician,  performed the HPI with the patient and updated documentation appropriately.          Comments    Pt states vision is the same OU.  Pt denies eye pain or discomfort and denies any new or worsening floaters or fol OU.       Last edited by Rennis Chris, MD on 03/23/2020 12:38 PM. (History)    Patient states vision seems worse at night. Difficulty with night driving.   Referring physician: Daisy Floro, MD [address] [place],  [place] [address]  HISTORICAL INFORMATION:   Selected notes from the MEDICAL RECORD NUMBER Referred by Dr. Krista Blue for retina eval   CURRENT MEDICATIONS: Current Outpatient Medications (Ophthalmic Drugs)  Medication Sig  . latanoprost (XALATAN) 0.005 % ophthalmic solution Place 1 drop into both eyes at bedtime.   No current facility-administered medications for this visit. (Ophthalmic Drugs)   Current Outpatient Medications (Other)  Medication Sig  . acetaminophen (TYLENOL) 500 MG tablet Take 500 mg by mouth as needed for mild pain.   Marland Kitchen amLODipine (NORVASC) 5 MG tablet Take 1 tablet (5 mg total) by mouth daily.  . carvedilol (COREG) 6.25 MG tablet Take 1 tablet by mouth twice daily  . ELIQUIS 5 MG TABS tablet Take 1 tablet by mouth twice daily  . Multiple Vitamin (MULTI-VITAMIN PO) Take 1 tablet by mouth daily.  . Omega-3 Fatty Acids (FISH OIL) 1000 MG CAPS Take 1,000 mg by mouth as needed (for added supplement).    No current facility-administered medications for this visit. (Other)      REVIEW OF SYSTEMS: ROS     Positive for: Eyes   Negative for: Constitutional, Gastrointestinal, Neurological, Skin, Genitourinary, Musculoskeletal, HENT, Endocrine, Cardiovascular, Respiratory, Psychiatric, Allergic/Imm, Heme/Lymph   Last edited by Corrinne Eagle on 03/23/2020 10:04 AM. (History)       ALLERGIES No Known Allergies  PAST MEDICAL HISTORY Past Medical History:  Diagnosis Date  . Cataract    Mixed form OU  . Chronic kidney disease    ACE inhibitor and HCTZ DCd in past due to rising Creatinine  . Hypertension    [place] 6/19:  neg for RA stenosis bilat; normal mesenteric arteries  . Hypertensive retinopathy    OU  . Permanent atrial fibrillation    s/p DCCV 04/2017 >> failed // CHADS2-VASc=2 >> Apixaban Rx   Past Surgical History:  Procedure Laterality Date  . CARDIOVERSION N/A 04/30/2017   Procedure: CARDIOVERSION;  Surgeon: Wendall Stade, MD;  Location: [facility] ENDOSCOPY;  Service: Cardiovascular;  Laterality: N/A;    FAMILY HISTORY History reviewed. No pertinent family history.  SOCIAL HISTORY Social History   Tobacco Use  . Smoking status: Never Smoker  . Smokeless tobacco: Never Used  Substance Use Topics  . Alcohol use: No  . Drug use: No         OPHTHALMIC EXAM:  Base Eye Exam    Visual Acuity (Snellen - Linear)      Right  Left   Dist [place] 20/25 -1 20/20 -2   Dist ph [place] 20/20 -2        Tonometry (Tonopen, 10:08 AM)      Right Left   Pressure 18 20       Pupils      Dark Light Shape React APD   Right 3 2 Round Brisk 0   Left 3 2 Round Brisk 0       Visual Fields      Left Right    Full Full       Extraocular Movement      Right Left    Full Full       Neuro/Psych    Oriented x3: Yes   Mood/Affect: Normal       Dilation    Both eyes: 1.0% Mydriacyl, 2.5% Phenylephrine @ 10:08 AM        Slit Lamp and Fundus Exam    Slit Lamp Exam      Right Left   Lids/Lashes Dermatochalasis - upper lid, Meibomian gland dysfunction Dermatochalasis - upper lid,  Meibomian gland dysfunction   Conjunctiva/Sclera Mild Melanosis Mild Melanosis   Cornea Trace Punctate epithelial erosions, mild Debris in tear film Trace Punctate epithelial erosions   Anterior Chamber Deep and quiet Deep and quiet   Iris Round and dilated Round and dilated   Lens 2-3+ Nuclear sclerosis, 2-3+ Cortical cataract, Pseudoexfoliative material on anterior capsule 2+ Nuclear sclerosis, 2-3+ Cortical cataract, early pseudoexfoliation    Vitreous Mild Vitreous syneresis, Posterior vitreous detachment Mild Vitreous syneresis, vitreous condensations       Fundus Exam      Right Left   Disc Pink and Sharp, +cupping Pink and Sharp, +cupping   C/D Ratio 0.6 0.6   Macula Flat, Blunted foveal reflex, focal IRH/MA IN to fovea with surrounding cystic changes -- worsened from prior, RPE mottling and clumping Flat, blunted foveal reflex, focal IRH/MA inferior and temporal to fovea--improved   Vessels Vascular attenuation, Tortuous Vascular attenuation, Tortuous, mild AV crossing changes   Periphery Attached, No heme  Attached, No heme         Refraction    Wearing Rx      Sphere Cylinder Axis   Right [place] +0.50 170   Left -0.75 +0.75 170          IMAGING AND PROCEDURES  Imaging and Procedures for @TODAY @  OCT, Retina - OU - Both Eyes       Right Eye Quality was good. Central Foveal Thickness: 340. Progression has worsened. Findings include intraretinal fluid, no SRF, abnormal foveal contour (Interval worsening in IRF and foveal profile ).   Left Eye Quality was good. Central Foveal Thickness: 261. Progression has been stable. Findings include normal foveal contour, no IRF, no SRF (Trace focal IRHM ).   Notes *Images captured and stored on drive  Diagnosis / Impression:  OD: abnormal foveal contour, Interval worsening in IRF and foveal profile  OS: NFP, no IRF/SRF; tr focal IRHM  Clinical management:  See below  Abbreviations: NFP - Normal foveal profile. CME -  cystoid macular edema. PED - pigment epithelial detachment. IRF - intraretinal fluid. SRF - subretinal fluid. EZ - ellipsoid zone. ERM - epiretinal membrane. ORA - outer retinal atrophy. ORT - outer retinal tubulation. SRHM - subretinal hyper-reflective material                 ASSESSMENT/PLAN:    ICD-10-CM   1. Essential hypertension  I10  2. Hypertensive retinopathy of both eyes  H35.033   3. Retinal hemorrhage of both eyes  H35.63   4. Retinal edema  H35.81 OCT, Retina - OU - Both Eyes  5. Combined forms of age-related cataract of both eyes  H25.813   6. 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 Hypertensive retinopathy with retinal hemorrhage OU and retinal edema OD  - BCVA remains good (20/20 OU) and pt essentially asymptomatic  - original OCT showed focal IRF/IRHM OD; OS with mild IRHM, no edema  - FA (01.14.21) shows focal, parafoveal hyperfluorescent leakage OU (OD >> OS)  - BP at initial visit 140s / 99 and 96 in R and L arms respectively  - suspect hemorrhage and edema related to HTN and use of eliquis for A fib  - BP meds adjusted and BP improved  - exam and OCT today shows interval worsening of IRF and cystic changes OD, but pt remains 20/20 and asymptomatic OD  - review of OCTs dating back to initial visit shows waxing and waning of IRF/edema over time  - no acute ocular intervention recommended at this time  - discussed possibility of anti-VEGF therapy if edema or vision worsens   - recommend f/u 2-3 months, sooner prn, with DFE/OCT, FA transit OD  5. 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  - discussed diagnosis and progression  - not yet visually significant  - monitor for now  6. 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      Return for 2-3 months DFE, OCT, FA transit OD.  There are no Patient Instructions on file for this visit.   Explained the diagnoses, plan, and follow up with the patient and they expressed understanding.  Patient expressed understanding of the importance of proper follow up care.   This document serves as a record of services personally performed by Karie Chimera, MD, PhD. It was created on their behalf by Annalee Genta, COMT. The creation of this record is the provider's dictation and/or activities during the visit.  Electronically signed by: Annalee Genta, COMT 03/23/20 12:42 PM  This document serves as a record of services personally performed by Karie Chimera, MD, PhD. It was created on their behalf by Cristopher Estimable, COT an ophthalmic technician. The creation of this record is the provider's dictation and/or activities during the visit.    Electronically signed by: Cristopher Estimable, COT 10.18.21 @ 12:42 PM  Karie Chimera, M.D., Ph.D. Diseases & Surgery of the Retina and Vitreous [facility] 03/23/2020   I have reviewed the above documentation for accuracy and completeness, and I agree with the above. Karie Chimera, M.D., Ph.D. 03/23/20 12:42 PM   Abbreviations: M myopia (nearsighted); A astigmatism; H hyperopia (farsighted); P presbyopia; Mrx spectacle prescription;  CTL contact lenses; OD right eye; OS left eye; OU both eyes  XT exotropia; ET esotropia; PEK punctate epithelial keratitis; PEE punctate epithelial erosions; DES dry eye syndrome; MGD meibomian gland dysfunction; ATs artificial tears; PFAT's preservative free artificial tears; NSC nuclear sclerotic cataract; PSC posterior subcapsular cataract; ERM epi-retinal membrane; PVD posterior vitreous detachment; RD retinal detachment; DM diabetes mellitus; DR diabetic retinopathy; NPDR non-proliferative diabetic retinopathy; PDR  proliferative diabetic retinopathy; CSME clinically significant macular edema; DME diabetic macular edema; dbh dot blot hemorrhages; CWS cotton wool spot; POAG primary open angle glaucoma; C/D cup-to-disc ratio; HVF humphrey visual field; GVF goldmann visual field; OCT optical coherence tomography; IOP intraocular pressure; BRVO Branch retinal vein occlusion; CRVO central retinal vein occlusion; CRAO central retinal artery occlusion; BRAO branch retinal artery occlusion; RT retinal tear; SB scleral buckle; PPV pars plana vitrectomy; VH Vitreous hemorrhage; PRP panretinal laser photocoagulation; IVK intravitreal kenalog; VMT vitreomacular traction; MH Macular hole;  NVD neovascularization of the disc; NVE neovascularization elsewhere; AREDS age related eye disease study; ARMD age related macular degeneration; POAG primary open angle glaucoma; EBMD epithelial/anterior basement membrane dystrophy; ACIOL anterior chamber intraocular lens; IOL intraocular lens; PCIOL posterior chamber intraocular lens; Phaco/IOL phacoemulsification with intraocular lens placement; [place] photorefractive keratectomy; LASIK laser assisted in situ keratomileusis; HTN hypertension; DM diabetes mellitus; COPD chronic obstructive pulmonary disease

## 2020-03-23 ENCOUNTER — Ambulatory Visit (INDEPENDENT_AMBULATORY_CARE_PROVIDER_SITE_OTHER): Payer: Medicare Other | Admitting: Ophthalmology

## 2020-03-23 ENCOUNTER — Other Ambulatory Visit: Payer: Self-pay

## 2020-03-23 ENCOUNTER — Encounter (INDEPENDENT_AMBULATORY_CARE_PROVIDER_SITE_OTHER): Payer: Self-pay | Admitting: Ophthalmology

## 2020-03-23 DIAGNOSIS — H35033 Hypertensive retinopathy, bilateral: Secondary | ICD-10-CM

## 2020-03-23 DIAGNOSIS — H3581 Retinal edema: Secondary | ICD-10-CM | POA: Diagnosis not present

## 2020-03-23 DIAGNOSIS — H3563 Retinal hemorrhage, bilateral: Secondary | ICD-10-CM

## 2020-03-23 DIAGNOSIS — I10 Essential (primary) hypertension: Secondary | ICD-10-CM

## 2020-03-23 DIAGNOSIS — H268 Other specified cataract: Secondary | ICD-10-CM

## 2020-03-23 DIAGNOSIS — H25813 Combined forms of age-related cataract, bilateral: Secondary | ICD-10-CM

## 2020-06-10 NOTE — Progress Notes (Addendum)
[facility] Note  06/15/2020     CHIEF COMPLAINT Patient presents for Retina Follow Up   HISTORY OF PRESENT ILLNESS: Billy Harrell is a 70 y.o. male who presents to the clinic today for:   HPI    Retina Follow Up    Patient presents with  Other.  In both eyes.  Duration of 12 weeks.  Since onset it is stable.  I, the attending physician,  performed the HPI with the patient and updated documentation appropriately.          Comments    12 week follow up HTN Retinopathy OU-  Doing well, no new problems, vision is stable OU.  Using Latanoprost qhs OU.       Last edited by Bernarda Caffey, MD on 06/15/2020  9:56 AM. (History)    pt states vision is okay  Referring physician: Lawerance Cruel, MD [place],  [place] [address]  HISTORICAL INFORMATION:   Selected notes from the MEDICAL RECORD NUMBER Referred by Dr. Parke Simmers for retina eval   CURRENT MEDICATIONS: Current Outpatient Medications (Ophthalmic Drugs)  Medication Sig   brimonidine (ALPHAGAN) 0.2 % ophthalmic solution Place 1 drop into both eyes 2 (two) times daily.   latanoprost (XALATAN) 0.005 % ophthalmic solution Place 1 drop into both eyes at bedtime.   No current facility-administered medications for this visit. (Ophthalmic Drugs)   Current Outpatient Medications (Other)  Medication Sig   acetaminophen (TYLENOL) 500 MG tablet Take 500 mg by mouth as needed for mild pain.    amLODipine (NORVASC) 5 MG tablet Take 1 tablet (5 mg total) by mouth daily.   carvedilol (COREG) 6.25 MG tablet Take 1 tablet by mouth twice daily   ELIQUIS 5 MG TABS tablet Take 1 tablet by mouth twice daily   Multiple Vitamin (MULTI-VITAMIN PO) Take 1 tablet by mouth daily.   Omega-3 Fatty Acids (FISH OIL) 1000 MG CAPS Take 1,000 mg by mouth as needed (for added supplement).    No current facility-administered medications for this visit. (Other)      REVIEW OF SYSTEMS: ROS     Positive for: Genitourinary, Cardiovascular, Eyes   Negative for: Constitutional, Gastrointestinal, Neurological, Skin, Musculoskeletal, HENT, Endocrine, Respiratory, Psychiatric, Allergic/Imm, Heme/Lymph   Last edited by Leonie Douglas, COA on 06/15/2020  7:56 AM. (History)       ALLERGIES No Known Allergies  PAST MEDICAL HISTORY Past Medical History:  Diagnosis Date   Cataract    Mixed form OU   Chronic kidney disease    ACE inhibitor and HCTZ DCd in past due to rising Creatinine   Hypertension    [place] 6/19:  neg for RA stenosis bilat; normal mesenteric arteries   Hypertensive retinopathy    OU   Permanent atrial fibrillation    s/p DCCV 04/2017 >> failed // CHADS2-VASc=2 >> Apixaban Rx   Past Surgical History:  Procedure Laterality Date   CARDIOVERSION N/A 04/30/2017   Procedure: CARDIOVERSION;  Surgeon: Josue Hector, MD;  Location: [facility];  Service: Cardiovascular;  Laterality: N/A;    FAMILY HISTORY History reviewed. No pertinent family history.  SOCIAL HISTORY Social History   Tobacco Use   Smoking status: Never Smoker   Smokeless tobacco: Never Used  Substance Use Topics   Alcohol use: No   Drug use: No         OPHTHALMIC EXAM:  Base Eye Exam    Visual Acuity (Snellen - Linear)  Right Left   Dist [place] 20/20 -2 20/20 -2       Tonometry (Tonopen, 8:00 AM)      Right Left   Pressure 21 21       Pupils      Dark Light Shape React APD   Right 3 2 Round Brisk None   Left 3 2 Round Brisk None       Visual Fields (Counting fingers)      Left Right    Full Full       Extraocular Movement      Right Left    Full Full       Neuro/Psych    Oriented x3: Yes   Mood/Affect: Normal       Dilation    Both eyes: 1.0% Mydriacyl, 2.5% Phenylephrine @ 8:00 AM        Slit Lamp and Fundus Exam    Slit Lamp Exam      Right Left   Lids/Lashes Dermatochalasis - upper lid, Meibomian gland dysfunction Dermatochalasis - upper lid,  Meibomian gland dysfunction   Conjunctiva/Sclera Mild Melanosis Mild Melanosis   Cornea Trace Punctate epithelial erosions, mild Debris in tear film Trace Punctate epithelial erosions   Anterior Chamber Deep and quiet Deep and quiet   Iris Round and dilated Round and dilated   Lens 2-3+ Nuclear sclerosis, 2-3+ Cortical cataract, Pseudoexfoliative material on anterior capsule 2+ Nuclear sclerosis, 2-3+ Cortical cataract, early pseudoexfoliation    Vitreous Mild Vitreous syneresis, Posterior vitreous detachment Mild Vitreous syneresis, vitreous condensations       Fundus Exam      Right Left   Disc Pink and Sharp, +cupping Pink and Sharp, +cupping   C/D Ratio 0.6 0.6   Macula Flat, Blunted foveal reflex, focal IRH/MA IN to fovea with surrounding cystic changes -- worsened from prior, RPE mottling and clumping, no discreet targets for focal laser Flat, blunted foveal reflex, rare MA, no edema   Vessels attenuated, mild tortuousity, mild Copper wiring, AV crossing changes attenuated, mild tortuousity, mild Copper wiring, AV crossing changes   Periphery Attached, No heme  Attached, No heme           IMAGING AND PROCEDURES  Imaging and Procedures for $RemoveBefore'@TODAY'[email or web address]$ @  OCT, Retina - OU - Both Eyes       Right Eye Quality was good. Central Foveal Thickness: 355. Progression has worsened. Findings include intraretinal fluid, no SRF, abnormal foveal contour (Interval increase in central cystic changes).   Left Eye Quality was good. Central Foveal Thickness: 259. Progression has been stable. Findings include normal foveal contour, no IRF, no SRF (Trace focal IRHM ).   Notes *Images captured and stored on drive  Diagnosis / Impression:  OD: abnormal foveal contour, Interval worsening in IRF and foveal profile  OS: NFP, no IRF/SRF; tr focal IRHM  Clinical management:  See below  Abbreviations: NFP - Normal foveal profile. CME - cystoid macular edema. PED - pigment epithelial detachment. IRF -  intraretinal fluid. SRF - subretinal fluid. EZ - ellipsoid zone. ERM - epiretinal membrane. ORA - outer retinal atrophy. ORT - outer retinal tubulation. SRHM - subretinal hyper-reflective material        Fluorescein Angiography Optos (Transit OD)       Right Eye   Progression has improved. Early phase findings include leakage, microaneurysm (Focal hyperfluorescent leakage IT macula). Mid/Late phase findings include leakage, microaneurysm (Mild interval improvement in IT leakage compared to January 2021 study).   Left Eye  Progression has been stable. Early phase findings include microaneurysm. Mid/Late phase findings include leakage, microaneurysm, staining (Focal, mild leakage parafovea greatest 0300, late staining of disc).   Notes **Images stored on drive**  Impression: Focal parafoveal hyperfluorescent leakage OU (OD > OS) +MA OU Mild late staining of disc OU OD: Mild interval improvement in IT leakage compared to January 2021 study                 ASSESSMENT/PLAN:    ICD-10-CM   1. Hypertensive retinopathy of both eyes  H35.033 Fluorescein Angiography Optos (Transit OD)  2. Essential hypertension  I10   3. Retinal hemorrhage of both eyes  H35.63   4. Retinal edema  H35.81 OCT, Retina - OU - Both Eyes  5. Combined forms of age-related cataract of both eyes  H25.813   6. 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 Hypertensive retinopathy with retinal hemorrhage OU and retinal edema OD  - BCVA remains good (20/20 OU) and pt essentially asymptomatic  - original OCT showed focal IRF/IRHM OD; OS with mild IRHM, no edema  - FA (01.14.21) shows focal, parafoveal hyperfluorescent leakage OU (OD >> OS)  - BP at initial visit 140s / 99 and 96 in R and L arms respectively  - suspect hemorrhage and edema related to HTN and use of eliquis for A fib  - BP meds adjusted and BP improved  - exam and OCT today  shows interval worsening of IRF and cystic changes OD, but pt remains 20/20 and asymptomatic OD  - review of OCTs dating back to initial visit shows waxing and waning of IRF/edema over time  - repeat FA 1.11.22 shows mild improvement in perifoveal leakage OD  - pt's daughter reports development of macular edema around same time pt started latanoprost  - suspect PGA-induced edema  - will stop latanoprost and start brimonidine BID OU  - f/u 4-6 weeks, DFE/OCT  5. 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 Ocular hypertension  - started on Latan by Dr. Parke Simmers  - edema OD started about the same time per daughter  - will switch Latan to brim due to possible macular edema from [place] Ordered this visit:  Meds ordered this encounter  Medications   brimonidine (ALPHAGAN) 0.2 % ophthalmic solution    Sig: Place 1 drop into both eyes 2 (two) times daily.    Dispense:  15 mL    Refill:  5      Return for f/u 4-6 weeks, HTN ret OD, DFE, OCT.  There are no Patient Instructions on file for this visit.   Explained the diagnoses, plan, and follow up with the patient and they expressed understanding.  Patient expressed understanding of the importance of proper follow up care.   This document serves as a record of services personally performed by Gardiner Sleeper, MD, PhD. It was created on their behalf by San Jetty. Owens Shark, OA an ophthalmic technician. The creation of this record is the provider's dictation and/or activities during the visit.    Electronically signed by: San Jetty. Owens Shark, [place] 01.11.2022 9:58  AM  Gardiner Sleeper, M.D., Ph.D. Diseases & Surgery of the  Retina and [place] 06/15/2020   I have reviewed the above documentation for accuracy and completeness, and I agree with the above. Gardiner Sleeper, M.D., Ph.D. 06/15/20 9:58 AM   Abbreviations: M myopia (nearsighted); A astigmatism; H hyperopia (farsighted); P presbyopia; Mrx spectacle prescription;  CTL contact lenses; OD right eye; OS left eye; OU both eyes  XT exotropia; ET esotropia; PEK punctate epithelial keratitis; PEE punctate epithelial erosions; DES dry eye syndrome; MGD meibomian gland dysfunction; ATs artificial tears; PFAT's preservative free artificial tears; [place] nuclear sclerotic cataract; PSC posterior subcapsular cataract; ERM epi-retinal membrane; PVD posterior vitreous detachment; RD retinal detachment; DM diabetes mellitus; DR diabetic retinopathy; NPDR non-proliferative diabetic retinopathy; PDR proliferative diabetic retinopathy; CSME clinically significant macular edema; DME diabetic macular edema; dbh dot blot hemorrhages; CWS cotton wool spot; POAG primary open angle glaucoma; C/D cup-to-disc ratio; HVF humphrey visual field; GVF goldmann visual field; OCT optical coherence tomography; IOP intraocular pressure; BRVO Branch retinal vein occlusion; CRVO central retinal vein occlusion; CRAO central retinal artery occlusion; BRAO branch retinal artery occlusion; RT retinal tear; SB scleral buckle; PPV pars plana vitrectomy; VH Vitreous hemorrhage; PRP panretinal laser photocoagulation; IVK intravitreal kenalog; VMT vitreomacular traction; MH Macular hole;  NVD neovascularization of the disc; NVE neovascularization elsewhere; AREDS age related eye disease study; ARMD age related macular degeneration; POAG primary open angle glaucoma; EBMD epithelial/anterior basement membrane dystrophy; ACIOL anterior chamber intraocular lens; IOL intraocular lens; PCIOL posterior chamber intraocular lens; Phaco/IOL phacoemulsification with intraocular lens placement;  [place] photorefractive keratectomy; LASIK laser assisted in situ keratomileusis; HTN hypertension; DM diabetes mellitus; COPD chronic obstructive pulmonary disease

## 2020-06-15 ENCOUNTER — Ambulatory Visit (INDEPENDENT_AMBULATORY_CARE_PROVIDER_SITE_OTHER): Payer: Medicare Other | Admitting: Ophthalmology

## 2020-06-15 ENCOUNTER — Encounter (INDEPENDENT_AMBULATORY_CARE_PROVIDER_SITE_OTHER): Payer: Self-pay | Admitting: Ophthalmology

## 2020-06-15 ENCOUNTER — Other Ambulatory Visit: Payer: Self-pay

## 2020-06-15 DIAGNOSIS — H268 Other specified cataract: Secondary | ICD-10-CM

## 2020-06-15 DIAGNOSIS — H25813 Combined forms of age-related cataract, bilateral: Secondary | ICD-10-CM

## 2020-06-15 DIAGNOSIS — H3563 Retinal hemorrhage, bilateral: Secondary | ICD-10-CM | POA: Diagnosis not present

## 2020-06-15 DIAGNOSIS — H35033 Hypertensive retinopathy, bilateral: Secondary | ICD-10-CM | POA: Diagnosis not present

## 2020-06-15 DIAGNOSIS — H3581 Retinal edema: Secondary | ICD-10-CM | POA: Diagnosis not present

## 2020-06-15 DIAGNOSIS — I10 Essential (primary) hypertension: Secondary | ICD-10-CM

## 2020-06-15 DIAGNOSIS — H40053 Ocular hypertension, bilateral: Secondary | ICD-10-CM

## 2020-06-15 MED ORDER — BRIMONIDINE TARTRATE 0.2 % OP SOLN: 1.0000 [drp] | Freq: Two times a day (BID) | OPHTHALMIC | 5 refills | Status: DC

## 2020-07-07 ENCOUNTER — Other Ambulatory Visit: Payer: Self-pay | Admitting: Physician Assistant

## 2020-07-12 NOTE — Progress Notes (Signed)
[facility] Note  07/20/2020     CHIEF COMPLAINT Patient presents for Retina Follow Up   HISTORY OF PRESENT ILLNESS: Billy Harrell is a 70 y.o. male who presents to the clinic today for:   HPI    Retina Follow Up    Patient presents with  Other.  In right eye.  This started months ago.  Severity is mild.  Duration of 5 weeks.  Since onset it is stable.  I, the attending physician,  performed the HPI with the patient and updated documentation appropriately.          Comments    70 y/o male pt here for 5 wk f/u for htn ret OU.  No change in [place] OU.  Denies pain, FOL, floaters.  Brimonidine BID OU.       Last edited by Bernarda Caffey, MD on 07/20/2020  8:37 AM. (History)    pt is with his daughter today, she states he has said he cannot tell any difference in his vision since switching pressure drops  Referring physician: Lawerance Cruel, MD [place],  [place] [address]  HISTORICAL INFORMATION:   Selected notes from the MEDICAL RECORD NUMBER Referred by Dr. Parke Simmers for retina eval   CURRENT MEDICATIONS: Current Outpatient Medications (Ophthalmic Drugs)  Medication Sig  . brimonidine (ALPHAGAN) 0.2 % ophthalmic solution Place 1 drop into both eyes 2 (two) times daily.  . Bromfenac Sodium (PROLENSA) 0.07 % SOLN Place 1 drop into the right eye 4 (four) times daily.  . prednisoLONE acetate (PRED FORTE) 1 % ophthalmic suspension Place 1 drop into the right eye 4 (four) times daily.  Marland Kitchen latanoprost (XALATAN) 0.005 % ophthalmic solution Place 1 drop into both eyes at bedtime. (Patient not taking: Reported on 07/20/2020)   No current facility-administered medications for this visit. (Ophthalmic Drugs)   Current Outpatient Medications (Other)  Medication Sig  . acetaminophen (TYLENOL) 500 MG tablet Take 500 mg by mouth as needed for mild pain.   Marland Kitchen amLODipine (NORVASC) 5 MG tablet Take 1 tablet by mouth once daily  . carvedilol (COREG) 6.25  MG tablet Take 1 tablet by mouth twice daily  . ELIQUIS 5 MG TABS tablet Take 1 tablet by mouth twice daily  . fluticasone (FLONASE) 50 MCG/ACT nasal spray 1 spray in each nostril  . Multiple Vitamin (MULTI-VITAMIN PO) Take 1 tablet by mouth daily.  . Omega-3 Fatty Acids (FISH OIL) 1000 MG CAPS Take 1,000 mg by mouth as needed (for added supplement).    No current facility-administered medications for this visit. (Other)      REVIEW OF SYSTEMS: ROS    Positive for: Cardiovascular, Eyes   Negative for: Constitutional, Gastrointestinal, Neurological, Skin, Genitourinary, Musculoskeletal, HENT, Endocrine, Respiratory, Psychiatric, Allergic/Imm, Heme/Lymph   Last edited by Matthew Folks, COA on 07/20/2020  8:05 AM. (History)       ALLERGIES No Known Allergies  PAST MEDICAL HISTORY Past Medical History:  Diagnosis Date  . Cataract    Mixed form OU  . Chronic kidney disease    ACE inhibitor and HCTZ DCd in past due to rising Creatinine  . Hypertension    [place] 6/19:  neg for RA stenosis bilat; normal mesenteric arteries  . Hypertensive retinopathy    OU  . Permanent atrial fibrillation    s/p DCCV 04/2017 >> failed // CHADS2-VASc=2 >> Apixaban Rx   Past Surgical History:  Procedure Laterality Date  . CARDIOVERSION N/A 04/30/2017  Procedure: CARDIOVERSION;  Surgeon: Josue Hector, MD;  Location: [facility] ENDOSCOPY;  Service: Cardiovascular;  Laterality: N/A;    FAMILY HISTORY History reviewed. No pertinent family history.  SOCIAL HISTORY Social History   Tobacco Use  . Smoking status: Never Smoker  . Smokeless tobacco: Never Used  Substance Use Topics  . Alcohol use: No  . Drug use: No         OPHTHALMIC EXAM:  Base Eye Exam    Visual Acuity (Snellen - Linear)      Right Left   Dist [place] 20/20 -2 20/20 -2       Tonometry (Tonopen, 8:08 AM)      Right Left   Pressure 18 20       Pupils      Dark Light Shape React APD   Right 3 2 Round Brisk None   Left 3  2 Round Brisk None       Visual Fields (Counting fingers)      Left Right    Full Full       Extraocular Movement      Right Left    Full, Ortho Full, Ortho       Neuro/Psych    Oriented x3: Yes   Mood/Affect: Normal       Dilation    Both eyes: 1.0% Mydriacyl, 2.5% Phenylephrine @ 8:08 AM        Slit Lamp and Fundus Exam    Slit Lamp Exam      Right Left   Lids/Lashes Dermatochalasis - upper lid, Meibomian gland dysfunction Dermatochalasis - upper lid, Meibomian gland dysfunction   Conjunctiva/Sclera Mild Melanosis Mild Melanosis   Cornea Trace Punctate epithelial erosions, mild Debris in tear film Trace Punctate epithelial erosions   Anterior Chamber Deep and quiet Deep and quiet   Iris Round and moderately dilated to 4.15mm Round and dilated   Lens 2-3+ Nuclear sclerosis, 2-3+ Cortical cataract, Pseudoexfoliative material on anterior capsule 2+ Nuclear sclerosis, 2-3+ Cortical cataract, early pseudoexfoliation    Vitreous Mild Vitreous syneresis, Posterior vitreous detachment Mild Vitreous syneresis, vitreous condensations       Fundus Exam      Right Left   Disc Pink and Sharp, +cupping Pink and Sharp, +cupping   C/D Ratio 0.6 0.6   Macula Flat, Blunted foveal reflex, focal IRH/MA IN to fovea with surrounding cystic changes -- worsened from prior, RPE mottling and clumping, no discreet targets for focal laser Flat, blunted foveal reflex, rare MA, no edema   Vessels attenuated, mild tortuousity, mild Copper wiring, AV crossing changes attenuated, mild tortuousity, mild Copper wiring, AV crossing changes   Periphery Attached, No heme  Attached, No heme           IMAGING AND PROCEDURES  Imaging and Procedures for $RemoveBefore'@TODAY'[email or web address]$ @  OCT, Retina - OU - Both Eyes       Right Eye Quality was good. Central Foveal Thickness: 385. Progression has worsened. Findings include intraretinal fluid, no SRF, abnormal foveal contour (Interval increase in central cystic changes).   Left  Eye Quality was good. Central Foveal Thickness: 267. Progression has been stable. Findings include normal foveal contour, no IRF, no SRF (Trace focal IRHM ).   Notes *Images captured and stored on drive  Diagnosis / Impression:  OD: abnormal foveal contour, Interval worsening in IRF and foveal profile  OS: NFP, no IRF/SRF; tr focal IRHM  Clinical management:  See below  Abbreviations: NFP - Normal foveal profile. CME - cystoid  macular edema. PED - pigment epithelial detachment. IRF - intraretinal fluid. SRF - subretinal fluid. EZ - ellipsoid zone. ERM - epiretinal membrane. ORA - outer retinal atrophy. ORT - outer retinal tubulation. SRHM - subretinal hyper-reflective material                 ASSESSMENT/PLAN:    ICD-10-CM   1. Retinal edema  H35.81 OCT, Retina - OU - Both Eyes  2. Essential hypertension  I10   3. Hypertensive retinopathy of both eyes  H35.033   4. Retinal hemorrhage of both eyes  H35.63   5. Combined forms of age-related cataract of both eyes  H25.813   6. 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 Hypertensive retinopathy with retinal hemorrhage OU and retinal edema OD  - BCVA remains good (20/20 OU) and pt essentially asymptomatic  - original OCT showed focal IRF/IRHM OD; OS with mild IRHM, no edema  - FA (01.14.21) shows focal, parafoveal hyperfluorescent leakage OU (OD >> OS)  - BP at initial visit 140s / 99 and 96 in R and L arms respectively  - suspect hemorrhage and edema related to HTN and use of eliquis for A fib  - BP meds adjusted and BP improved  - exam and OCT today shows interval worsening of IRF and cystic changes OD, but pt remains 20/20 and asymptomatic OD  - review of OCTs dating back to initial visit shows waxing and waning of IRF/edema over time  - repeat FA 1.11.22 shows mild improvement in perifoveal leakage OD  - pt's daughter reports development of macular edema around  same time pt started latanoprost  - ?PGA-induced edema  - stopped latanoprost and started brimonidine BID OU -- no significant improvement  - will start PF and Prolensa QID OD  - f/u 4-6 weeks, DFE/OCT  5. 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 Ocular hypertension  - started on Latan by Dr. Krista Blue  - edema OD started about the same time per daughter  - switched from Latan to brim due to possible macular edema from latan  - continue brim BID  Ophthalmic Meds Ordered this visit:  Meds ordered this encounter  Medications  . prednisoLONE acetate (PRED FORTE) 1 % ophthalmic suspension    Sig: Place 1 drop into the right eye 4 (four) times daily.    Dispense:  15 mL    Refill:  0  . Bromfenac Sodium (PROLENSA) 0.07 % SOLN    Sig: Place 1 drop into the right eye 4 (four) times daily.    Dispense:  6 mL    Refill:  3      Return for f/u 4-6 weeks, HTN ret OD, DFE, OCT.  There are no Patient Instructions on file for this visit.   Explained the diagnoses, plan, and follow up with the patient and they expressed understanding.  Patient expressed understanding of the importance of proper follow up care.   This document serves as a record of services personally performed by Karie Chimera, MD, PhD. It was created on their behalf by Cristopher Estimable, COT an ophthalmic technician. The  creation of this record is the provider's dictation and/or activities during the visit.    Electronically signed by: Estill Bakes, COT 2.7.22 @ 9:00 AM   This document serves as a record of services personally performed by Gardiner Sleeper, MD, PhD. It was created on their behalf by San Jetty.  Owens Shark, OA an ophthalmic technician. The creation of this record is the provider's dictation and/or activities during the visit.    Electronically signed by: San Jetty. Owens Shark, [place] 02.15.2022 9:00 AM  Gardiner Sleeper, M.D., Ph.D. Diseases & Surgery of the Retina and [place] 07/20/2020   I have reviewed the above documentation for accuracy and completeness, and I agree with the above. Gardiner Sleeper, M.D., Ph.D. 07/20/20 9:00 AM   Abbreviations: M myopia (nearsighted); A astigmatism; H hyperopia (farsighted); P presbyopia; Mrx spectacle prescription;  CTL contact lenses; OD right eye; OS left eye; OU both eyes  XT exotropia; ET esotropia; PEK punctate epithelial keratitis; PEE punctate epithelial erosions; DES dry eye syndrome; MGD meibomian gland dysfunction; ATs artificial tears; PFAT's preservative free artificial tears; [place] nuclear sclerotic cataract; PSC posterior subcapsular cataract; ERM epi-retinal membrane; PVD posterior vitreous detachment; RD retinal detachment; DM diabetes mellitus; DR diabetic retinopathy; NPDR non-proliferative diabetic retinopathy; PDR proliferative diabetic retinopathy; CSME clinically significant macular edema; DME diabetic macular edema; dbh dot blot hemorrhages; CWS cotton wool spot; POAG primary open angle glaucoma; C/D cup-to-disc ratio; HVF humphrey visual field; GVF goldmann visual field; OCT optical coherence tomography; IOP intraocular pressure; BRVO Branch retinal vein occlusion; CRVO central retinal vein occlusion; CRAO central retinal artery occlusion; BRAO branch retinal artery occlusion; RT retinal tear; SB scleral buckle; PPV pars plana vitrectomy; VH Vitreous hemorrhage; PRP panretinal laser photocoagulation; IVK intravitreal kenalog; VMT vitreomacular traction; MH Macular hole;  NVD neovascularization of the disc; NVE neovascularization elsewhere; AREDS age related eye disease study; ARMD age related macular degeneration;  POAG primary open angle glaucoma; EBMD epithelial/anterior basement membrane dystrophy; ACIOL anterior chamber intraocular lens; IOL intraocular lens; PCIOL posterior chamber intraocular lens; Phaco/IOL phacoemulsification with intraocular lens placement; [place] photorefractive keratectomy; LASIK laser assisted in situ keratomileusis; HTN hypertension; DM diabetes mellitus; COPD chronic obstructive pulmonary disease

## 2020-07-20 ENCOUNTER — Ambulatory Visit (INDEPENDENT_AMBULATORY_CARE_PROVIDER_SITE_OTHER): Payer: Medicare Other | Admitting: Ophthalmology

## 2020-07-20 ENCOUNTER — Other Ambulatory Visit: Payer: Self-pay

## 2020-07-20 ENCOUNTER — Encounter (INDEPENDENT_AMBULATORY_CARE_PROVIDER_SITE_OTHER): Payer: Self-pay | Admitting: Ophthalmology

## 2020-07-20 DIAGNOSIS — H25813 Combined forms of age-related cataract, bilateral: Secondary | ICD-10-CM | POA: Diagnosis not present

## 2020-07-20 DIAGNOSIS — H35033 Hypertensive retinopathy, bilateral: Secondary | ICD-10-CM | POA: Diagnosis not present

## 2020-07-20 DIAGNOSIS — H3581 Retinal edema: Secondary | ICD-10-CM | POA: Diagnosis not present

## 2020-07-20 DIAGNOSIS — H3563 Retinal hemorrhage, bilateral: Secondary | ICD-10-CM

## 2020-07-20 DIAGNOSIS — I10 Essential (primary) hypertension: Secondary | ICD-10-CM | POA: Diagnosis not present

## 2020-07-20 DIAGNOSIS — H40053 Ocular hypertension, bilateral: Secondary | ICD-10-CM

## 2020-07-20 DIAGNOSIS — H268 Other specified cataract: Secondary | ICD-10-CM | POA: Diagnosis not present

## 2020-07-20 MED ORDER — PREDNISOLONE ACETATE 1 % OP SUSP: 1.0000 [drp] | Freq: Four times a day (QID) | OPHTHALMIC | 0 refills | Status: DC

## 2020-07-20 MED ORDER — PROLENSA 0.07 % OP SOLN: 1.0000 [drp] | Freq: Four times a day (QID) | OPHTHALMIC | 3 refills | Status: DC

## 2020-08-20 ENCOUNTER — Other Ambulatory Visit: Payer: Self-pay | Admitting: Physician Assistant

## 2020-08-31 NOTE — Progress Notes (Signed)
[facility] Note  09/02/2020     CHIEF COMPLAINT Patient presents for Retina Follow Up   HISTORY OF PRESENT ILLNESS: Billy Harrell is a 70 y.o. male who presents to the clinic today for:   HPI    Retina Follow Up    Patient presents with  Other.  In right eye.  This started 6 weeks ago.  I, the attending physician,  performed the HPI with the patient and updated documentation appropriately.          Comments    Patient here for 6 weeks retina follow up for HTN OD.Patient states vision doing ok. No eye pain.       Last edited by Bernarda Caffey, MD on 09/02/2020  8:47 AM. (History)    pt states vision is stable, he has been using PF and Prolensa QID OD since his last visit  Referring physician: Lawerance Cruel, MD [place],  [place] [address]  HISTORICAL INFORMATION:   Selected notes from the MEDICAL RECORD NUMBER Referred by Dr. Parke Simmers for retina eval   CURRENT MEDICATIONS: Current Outpatient Medications (Ophthalmic Drugs)  Medication Sig  . brimonidine (ALPHAGAN) 0.2 % ophthalmic solution Place 1 drop into both eyes 2 (two) times daily.  . Bromfenac Sodium (PROLENSA) 0.07 % SOLN Place 1 drop into the right eye 4 (four) times daily.  Marland Kitchen latanoprost (XALATAN) 0.005 % ophthalmic solution Place 1 drop into both eyes at bedtime. (Patient not taking: Reported on 07/20/2020)  . prednisoLONE acetate (PRED FORTE) 1 % ophthalmic suspension Place 1 drop into the right eye 4 (four) times daily.   No current facility-administered medications for this visit. (Ophthalmic Drugs)   Current Outpatient Medications (Other)  Medication Sig  . acetaminophen (TYLENOL) 500 MG tablet Take 500 mg by mouth as needed for mild pain.   Marland Kitchen amLODipine (NORVASC) 5 MG tablet Take 1 tablet by mouth once daily  . carvedilol (COREG) 6.25 MG tablet Take 1 tablet by mouth twice daily  . ELIQUIS 5 MG TABS tablet Take 1 tablet by mouth twice daily  . fluticasone  (FLONASE) 50 MCG/ACT nasal spray 1 spray in each nostril  . Multiple Vitamin (MULTI-VITAMIN PO) Take 1 tablet by mouth daily.  . Omega-3 Fatty Acids (FISH OIL) 1000 MG CAPS Take 1,000 mg by mouth as needed (for added supplement).    No current facility-administered medications for this visit. (Other)      REVIEW OF SYSTEMS: ROS    Positive for: Genitourinary, Cardiovascular, Eyes   Negative for: Constitutional, Gastrointestinal, Neurological, Skin, Musculoskeletal, HENT, Endocrine, Respiratory, Psychiatric, Allergic/Imm, Heme/Lymph   Last edited by Theodore Demark, COA on 09/02/2020  8:18 AM. (History)       ALLERGIES No Known Allergies  PAST MEDICAL HISTORY Past Medical History:  Diagnosis Date  . Cataract    Mixed form OU  . Chronic kidney disease    ACE inhibitor and HCTZ DCd in past due to rising Creatinine  . Hypertension    [place] 6/19:  neg for RA stenosis bilat; normal mesenteric arteries  . Hypertensive retinopathy    OU  . Permanent atrial fibrillation    s/p DCCV 04/2017 >> failed // CHADS2-VASc=2 >> Apixaban Rx   Past Surgical History:  Procedure Laterality Date  . CARDIOVERSION N/A 04/30/2017   Procedure: CARDIOVERSION;  Surgeon: Josue Hector, MD;  Location: [facility] ENDOSCOPY;  Service: Cardiovascular;  Laterality: N/A;    FAMILY HISTORY History reviewed. No pertinent  family history.  SOCIAL HISTORY Social History   Tobacco Use  . Smoking status: Never Smoker  . Smokeless tobacco: Never Used  Substance Use Topics  . Alcohol use: No  . Drug use: No         OPHTHALMIC EXAM:  Base Eye Exam    Visual Acuity (Snellen - Linear)      Right Left   Dist [place] 20/20 -2 20/20 -1       Tonometry (Tonopen, 8:14 AM)      Right Left   Pressure 20 17       Pupils      Dark Light Shape React APD   Right 3 2 Round Brisk None   Left 3 2 Round Brisk None       Visual Fields (Counting fingers)      Left Right    Full Full       Extraocular Movement       Right Left    Full, Ortho Full, Ortho       Neuro/Psych    Oriented x3: Yes   Mood/Affect: Normal       Dilation    Both eyes: 1.0% Mydriacyl, 2.5% Phenylephrine @ 8:14 AM        Slit Lamp and Fundus Exam    Slit Lamp Exam      Right Left   Lids/Lashes Dermatochalasis - upper lid, Meibomian gland dysfunction Dermatochalasis - upper lid, Meibomian gland dysfunction   Conjunctiva/Sclera Mild Melanosis Mild Melanosis   Cornea Trace Punctate epithelial erosions, mild Debris in tear film Trace Punctate epithelial erosions   Anterior Chamber Deep, 1+ pigment Deep and quiet   Iris Round and dilated Round and dilated   Lens 2-3+ Nuclear sclerosis, 2-3+ Cortical cataract, Pseudoexfoliative material on anterior capsule 2+ Nuclear sclerosis, 2-3+ Cortical cataract, early pseudoexfoliation    Vitreous Mild Vitreous syneresis, Posterior vitreous detachment Mild Vitreous syneresis, vitreous condensations       Fundus Exam      Right Left   Disc Pink and Sharp, +cupping Pink and Sharp, +cupping   C/D Ratio 0.6 0.6   Macula Flat, Blunted foveal reflex, focal IRH/MA IN to fovea with surrounding cystic changes -- large central cyst, worsened from prior, RPE mottling and clumping, no discreet targets for focal laser Flat, blunted foveal reflex, mild RPE mottling, rare MA, no edema   Vessels attenuated, mild tortuousity attenuated, tortuousity, mild Copper wiring, AV crossing changes   Periphery Attached, No heme  Attached, No heme         Refraction    Wearing Rx      Sphere Cylinder Axis   Right [place] +0.50 170   Left -0.75 +0.75 170          IMAGING AND PROCEDURES  Imaging and Procedures for $RemoveBefore'@[email or web address]'[email or web address]$ @  OCT, Retina - OU - Both Eyes       Right Eye Quality was good. Central Foveal Thickness: 395. Progression has worsened. Findings include intraretinal fluid, no SRF, abnormal foveal contour (Interval increase in central cystic changes).   Left Eye Quality was good. Central  Foveal Thickness: 264. Progression has been stable. Findings include normal foveal contour, no IRF, no SRF (Trace focal IRHM ).   Notes *Images captured and stored on drive  Diagnosis / Impression:  OD: abnormal foveal contour, Interval worsening in IRF and foveal profile -- central cyst larger OS: NFP, no IRF/SRF; tr focal IRHM  Clinical management:  See below  Abbreviations: NFP - Normal  foveal profile. CME - cystoid macular edema. PED - pigment epithelial detachment. IRF - intraretinal fluid. SRF - subretinal fluid. EZ - ellipsoid zone. ERM - epiretinal membrane. ORA - outer retinal atrophy. ORT - outer retinal tubulation. SRHM - subretinal hyper-reflective material                 ASSESSMENT/PLAN:    ICD-10-CM   1. Essential hypertension  I10   2. Hypertensive retinopathy of both eyes  H35.033   3. Retinal edema  H35.81 OCT, Retina - OU - Both Eyes  4. Retinal hemorrhage of both eyes  H35.63   5. Combined forms of age-related cataract of both eyes  H25.813   6. 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 Pseudoexfoliation of lens capsule, right eye  H26.8   8. 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 Hypertensive retinopathy with retinal hemorrhage OU and retinal edema OD  - BCVA remains good (20/20 OU) and pt essentially asymptomatic  - original OCT showed focal IRF/IRHM OD; OS with mild IRHM, no edema  - FA (01.14.21) shows focal, parafoveal hyperfluorescent leakage OU (OD >> OS)  - BP at initial visit 140s / 99 and 96 in R and L arms respectively  - suspect hemorrhage and edema related to HTN and use of eliquis for A fib  - BP meds adjusted and BP improved  - exam and OCT today shows interval worsening of IRF and cystic changes OD, but pt remains 20/20 and asymptomatic OD  - review of OCTs dating back to initial visit shows waxing and waning of IRF/edema over time, but last several OCTs have been progressively worse  - repeat FA 1.11.22 shows mild  improvement in perifoveal leakage OD  - pt's daughter reports development of macular edema around same time pt started latanoprost  - ?PGA-induced edema  - stopped latanoprost and started brimonidine BID OU -- no significant improvement  - started PF and Prolensa QID OD on 07/20/20-- no significant improvement -- okay to stop Prolensa, taper PF BID x2 weeks, Qdaily x2 weeks then stop  - discussed anti-VEGF therapy to reduced macular edema  - will hold off for now  - f/u 4-6 weeks, DFE/OCT  5. 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  - discussed diagnosis and progression  - not yet visually significant  - monitor for now  6,7. 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 Ocular hypertension  - started on Latan by Dr. Krista Blue  - edema OD started about the same time per daughter  - switched from Latan to brim due to possible macular edema from latan  - continue brim BID OU  Ophthalmic Meds Ordered this visit:  No orders of the defined types were placed in this encounter.     Return in about 6 weeks (around 10/14/2020) for f/u 6 weeks, HTN ret OD.  There are no Patient Instructions on file for this visit.   Explained the diagnoses, plan, and follow up with the patient and they expressed understanding.  Patient expressed understanding of the importance of proper follow up care.   This document serves as a record of services personally performed by Karie Chimera, MD, PhD. It was created on their behalf by Cristopher Estimable, COT an ophthalmic technician. The creation of this record is the  provider's dictation and/or activities during the visit.    Electronically signed by: Estill Bakes, COT 3.29.22 @ 1:25 PM   This document serves as a record of services personally performed by Gardiner Sleeper, MD, PhD. It  was created on their behalf by San Jetty. Owens Shark, OA an ophthalmic technician. The creation of this record is the provider's dictation and/or activities during the visit.    Electronically signed by: San Jetty. Owens Shark, OA 03.31.2022 1:25 PM  Gardiner Sleeper, M.D., Ph.D. Diseases & Surgery of the Retina and [place] 09/02/2020  I have reviewed the above documentation for accuracy and completeness, and I agree with the above. Gardiner Sleeper, M.D., Ph.D. 09/02/20 1:25 PM  Abbreviations: M myopia (nearsighted); A astigmatism; H hyperopia (farsighted); P presbyopia; Mrx spectacle prescription;  CTL contact lenses; OD right eye; OS left eye; OU both eyes  XT exotropia; ET esotropia; PEK punctate epithelial keratitis; PEE punctate epithelial erosions; DES dry eye syndrome; MGD meibomian gland dysfunction; ATs artificial tears; PFAT's preservative free artificial tears; [place] nuclear sclerotic cataract; PSC posterior subcapsular cataract; ERM epi-retinal membrane; PVD posterior vitreous detachment; RD retinal detachment; DM diabetes mellitus; DR diabetic retinopathy; NPDR non-proliferative diabetic retinopathy; PDR proliferative diabetic retinopathy; CSME clinically significant macular edema; DME diabetic macular edema; dbh dot blot hemorrhages; CWS cotton wool spot; POAG primary open angle glaucoma; C/D cup-to-disc ratio; HVF humphrey visual field; GVF goldmann visual field; OCT optical coherence tomography; IOP intraocular pressure; BRVO Branch retinal vein occlusion; CRVO central retinal vein occlusion; CRAO central retinal artery occlusion; BRAO branch retinal artery occlusion; RT retinal tear; SB scleral buckle; PPV pars plana vitrectomy; VH Vitreous hemorrhage; PRP panretinal laser photocoagulation; IVK intravitreal kenalog; VMT vitreomacular traction; MH Macular hole;  NVD neovascularization of the disc; NVE neovascularization elsewhere; AREDS age related eye disease study;  ARMD age related macular degeneration; POAG primary open angle glaucoma; EBMD epithelial/anterior basement membrane dystrophy; ACIOL anterior chamber intraocular lens; IOL intraocular lens; PCIOL posterior chamber intraocular lens; Phaco/IOL phacoemulsification with intraocular lens placement; [place] photorefractive keratectomy; LASIK laser assisted in situ keratomileusis; HTN hypertension; DM diabetes mellitus; COPD chronic obstructive pulmonary disease

## 2020-09-02 ENCOUNTER — Other Ambulatory Visit: Payer: Self-pay

## 2020-09-02 ENCOUNTER — Encounter (INDEPENDENT_AMBULATORY_CARE_PROVIDER_SITE_OTHER): Payer: Self-pay | Admitting: Ophthalmology

## 2020-09-02 ENCOUNTER — Ambulatory Visit (INDEPENDENT_AMBULATORY_CARE_PROVIDER_SITE_OTHER): Payer: Medicare Other | Admitting: Ophthalmology

## 2020-09-02 DIAGNOSIS — H3563 Retinal hemorrhage, bilateral: Secondary | ICD-10-CM | POA: Diagnosis not present

## 2020-09-02 DIAGNOSIS — H35033 Hypertensive retinopathy, bilateral: Secondary | ICD-10-CM

## 2020-09-02 DIAGNOSIS — H3581 Retinal edema: Secondary | ICD-10-CM

## 2020-09-02 DIAGNOSIS — H268 Other specified cataract: Secondary | ICD-10-CM | POA: Diagnosis not present

## 2020-09-02 DIAGNOSIS — I10 Essential (primary) hypertension: Secondary | ICD-10-CM

## 2020-09-02 DIAGNOSIS — H40053 Ocular hypertension, bilateral: Secondary | ICD-10-CM | POA: Diagnosis not present

## 2020-09-02 DIAGNOSIS — H25813 Combined forms of age-related cataract, bilateral: Secondary | ICD-10-CM

## 2020-09-07 DIAGNOSIS — H401412 Capsular glaucoma with pseudoexfoliation of lens, right eye, moderate stage: Secondary | ICD-10-CM | POA: Diagnosis not present

## 2020-09-07 DIAGNOSIS — H2513 Age-related nuclear cataract, bilateral: Secondary | ICD-10-CM | POA: Diagnosis not present

## 2020-09-07 DIAGNOSIS — H524 Presbyopia: Secondary | ICD-10-CM | POA: Diagnosis not present

## 2020-09-07 DIAGNOSIS — H35073 Retinal telangiectasis, bilateral: Secondary | ICD-10-CM | POA: Diagnosis not present

## 2020-09-07 DIAGNOSIS — H401421 Capsular glaucoma with pseudoexfoliation of lens, left eye, mild stage: Secondary | ICD-10-CM | POA: Diagnosis not present

## 2020-09-29 ENCOUNTER — Other Ambulatory Visit (INDEPENDENT_AMBULATORY_CARE_PROVIDER_SITE_OTHER): Payer: Self-pay

## 2020-09-29 MED ORDER — BRIMONIDINE TARTRATE 0.2 % OP SOLN: 1.0000 [drp] | Freq: Two times a day (BID) | OPHTHALMIC | 1 refills | Status: DC

## 2020-10-08 NOTE — Progress Notes (Shared)
[facility] - Clinic Note  10/14/2020     CHIEF COMPLAINT Patient presents for No chief complaint on file.   HISTORY OF PRESENT ILLNESS: Billy Harrell is a 70 y.o. male who presents to the clinic today for:   pt states vision is stable, he has been using PF and Prolensa QID OD since his last visit  Referring physician: Daisy Floro, MD [address] [place],  [place] [address]  HISTORICAL INFORMATION:   Selected notes from the MEDICAL RECORD NUMBER Referred by Dr. Krista Blue for retina eval   CURRENT MEDICATIONS: Current Outpatient Medications (Ophthalmic Drugs)  Medication Sig  . brimonidine (ALPHAGAN) 0.2 % ophthalmic solution Place 1 drop into both eyes 2 (two) times daily.  . brimonidine (ALPHAGAN) 0.2 % ophthalmic solution Place 1 drop into both eyes 2 (two) times daily.  . Bromfenac Sodium (PROLENSA) 0.07 % SOLN Place 1 drop into the right eye 4 (four) times daily.  Marland Kitchen latanoprost (XALATAN) 0.005 % ophthalmic solution Place 1 drop into both eyes at bedtime. (Patient not taking: Reported on 07/20/2020)  . prednisoLONE acetate (PRED FORTE) 1 % ophthalmic suspension Place 1 drop into the right eye 4 (four) times daily.   No current facility-administered medications for this visit. (Ophthalmic Drugs)   Current Outpatient Medications (Other)  Medication Sig  . acetaminophen (TYLENOL) 500 MG tablet Take 500 mg by mouth as needed for mild pain.   Marland Kitchen amLODipine (NORVASC) 5 MG tablet Take 1 tablet by mouth once daily  . carvedilol (COREG) 6.25 MG tablet Take 1 tablet by mouth twice daily  . ELIQUIS 5 MG TABS tablet Take 1 tablet by mouth twice daily  . fluticasone (FLONASE) 50 MCG/ACT nasal spray 1 spray in each nostril  . Multiple Vitamin (MULTI-VITAMIN PO) Take 1 tablet by mouth daily.  . Omega-3 Fatty Acids (FISH OIL) 1000 MG CAPS Take 1,000 mg by mouth as needed (for added supplement).    No current facility-administered medications for this visit.  (Other)      REVIEW OF SYSTEMS:    ALLERGIES No Known Allergies  PAST MEDICAL HISTORY Past Medical History:  Diagnosis Date  . Cataract    Mixed form OU  . Chronic kidney disease    ACE inhibitor and HCTZ DCd in past due to rising Creatinine  . Hypertension    [place] 6/19:  neg for RA stenosis bilat; normal mesenteric arteries  . Hypertensive retinopathy    OU  . Permanent atrial fibrillation    s/p DCCV 04/2017 >> failed // CHADS2-VASc=2 >> Apixaban Rx   Past Surgical History:  Procedure Laterality Date  . CARDIOVERSION N/A 04/30/2017   Procedure: CARDIOVERSION;  Surgeon: Wendall Stade, MD;  Location[facility] ENDOSCOPY;  Service: Cardiovascular;  Laterality: N/A;    FAMILY HISTORY No family history on file.  SOCIAL HISTORY Social History   Tobacco Use  . Smoking status: Never Smoker  . Smokeless tobacco: Never Used  Substance Use Topics  . Alcohol use: No  . Drug use: No         OPHTHALMIC EXAM:  Not recorded     IMAGING AND PROCEDURES  Imaging and Procedures for @TODAY @           ASSESSMENT/PLAN:    ICD-10-CM   1. Essential hypertension  I10   2. Hypertensive retinopathy of both eyes  H35.033   3. Retinal edema  H35.81   4. Retinal hemorrhage of both eyes  H35.63   5. Combined  forms of age-related cataract of both eyes  H25.813   6. 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 Hypertensive retinopathy with retinal hemorrhage OU and retinal edema OD  - BCVA remains good (20/20 OU) and pt essentially asymptomatic  - original OCT showed focal IRF/IRHM OD; OS with mild IRHM, no edema  - FA (01.14.21) shows focal, parafoveal hyperfluorescent leakage OU (OD >> OS)  - BP at initial visit 140s / 99 and 96 in R and L arms respectively  - suspect hemorrhage and edema related to HTN and use of eliquis for A fib  - BP meds adjusted and BP improved  - exam and OCT today shows interval worsening  of IRF and cystic changes OD, but pt remains 20/20 and asymptomatic OD  - review of OCTs dating back to initial visit shows waxing and waning of IRF/edema over time, but last several OCTs have been progressively worse  - repeat FA 1.11.22 shows mild improvement in perifoveal leakage OD  - pt's daughter reports development of macular edema around same time pt started latanoprost  - ?PGA-induced edema  - stopped latanoprost and started brimonidine BID OU -- no significant improvement  - started PF and Prolensa QID OD on 07/20/20-- no significant improvement -- okay to stop Prolensa, taper PF BID x2 weeks, Qdaily x2 weeks then stop  - discussed anti-VEGF therapy to reduced macular edema  - will hold off for now  - f/u 4-6 weeks, DFE/OCT  5. 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  - discussed diagnosis and progression  - not yet visually significant  - monitor for now  6,7. 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 Ocular hypertension  - started on Latan by Dr. Krista Blue  - edema OD started about the same time per daughter  - switched from Latan to brim due to possible macular edema from latanoprost   - continue brim BID OU  Ophthalmic Meds Ordered this visit:  No orders of the defined types were placed in this encounter.     No follow-ups on file.  There are no Patient Instructions on file for this visit.   Explained the diagnoses, plan, and follow up with the patient and they expressed understanding.  Patient expressed understanding of the importance of proper follow up care.   This document serves as a record of services personally performed by Karie Chimera, MD, PhD. It was created on their behalf by Joni Reining, an ophthalmic technician. The creation of this record is the provider's dictation  and/or activities during the visit.    Electronically signed by: Joni Reining COA, 10/08/20  12:28 PM  Karie Chimera, M.D., Ph.D. Diseases & Surgery of the Retina and Vitreous [facility] 10/14/2020   Abbreviations: M myopia (nearsighted); A astigmatism; H hyperopia (farsighted); P presbyopia; Mrx spectacle prescription;  CTL contact lenses; OD right eye; OS left eye; OU both eyes  XT exotropia; ET esotropia; PEK punctate epithelial keratitis; PEE punctate epithelial erosions; DES dry eye syndrome; MGD meibomian gland dysfunction; ATs artificial tears; PFAT's preservative free artificial tears; [place] nuclear sclerotic cataract; PSC posterior subcapsular cataract; ERM epi-retinal membrane; PVD posterior vitreous detachment; RD retinal detachment; DM diabetes mellitus; DR diabetic retinopathy; NPDR non-proliferative diabetic retinopathy; PDR  proliferative diabetic retinopathy; CSME clinically significant macular edema; DME diabetic macular edema; dbh dot blot hemorrhages; CWS cotton wool spot; POAG primary open angle glaucoma; C/D cup-to-disc ratio; HVF humphrey visual field; GVF goldmann visual field; OCT optical coherence tomography; IOP intraocular pressure; BRVO Branch retinal vein occlusion; CRVO central retinal vein occlusion; CRAO central retinal artery occlusion; BRAO branch retinal artery occlusion; RT retinal tear; SB scleral buckle; PPV pars plana vitrectomy; VH Vitreous hemorrhage; PRP panretinal laser photocoagulation; IVK intravitreal kenalog; VMT vitreomacular traction; MH Macular hole;  NVD neovascularization of the disc; NVE neovascularization elsewhere; AREDS age related eye disease study; ARMD age related macular degeneration; POAG primary open angle glaucoma; EBMD epithelial/anterior basement membrane dystrophy; ACIOL anterior chamber intraocular lens; IOL intraocular lens; PCIOL posterior chamber intraocular lens; Phaco/IOL phacoemulsification with intraocular  lens placement; PRK photorefractive keratectomy; LASIK laser assisted in situ keratomileusis; HTN hypertension; DM diabetes mellitus; COPD chronic obstructive pulmonary disease

## 2020-10-14 ENCOUNTER — Encounter (INDEPENDENT_AMBULATORY_CARE_PROVIDER_SITE_OTHER): Payer: Medicare Other | Admitting: Ophthalmology

## 2020-10-14 DIAGNOSIS — I10 Essential (primary) hypertension: Secondary | ICD-10-CM

## 2020-10-14 DIAGNOSIS — H3563 Retinal hemorrhage, bilateral: Secondary | ICD-10-CM

## 2020-10-14 DIAGNOSIS — H35033 Hypertensive retinopathy, bilateral: Secondary | ICD-10-CM

## 2020-10-14 DIAGNOSIS — H25813 Combined forms of age-related cataract, bilateral: Secondary | ICD-10-CM

## 2020-10-14 DIAGNOSIS — H40053 Ocular hypertension, bilateral: Secondary | ICD-10-CM

## 2020-10-14 DIAGNOSIS — H268 Other specified cataract: Secondary | ICD-10-CM

## 2020-10-14 DIAGNOSIS — H3581 Retinal edema: Secondary | ICD-10-CM

## 2020-11-08 NOTE — Progress Notes (Addendum)
[facility] Note  11/09/2020     CHIEF COMPLAINT Patient presents for Retina Follow Up   HISTORY OF PRESENT ILLNESS: Billy Harrell is a 70 y.o. male who presents to the clinic today for:   HPI    Retina Follow Up    Patient presents with  Other.  In both eyes.  This started weeks ago.  Severity is moderate.  Duration of weeks.  Since onset it is stable.  I, the attending physician,  performed the HPI with the patient and updated documentation appropriately.          Comments    Pt states his vision is the same OU.  Pt denies eye pain or discomfort and denies any new or worsening floaters or fol OU.       Last edited by Bernarda Caffey, MD on 11/09/2020 10:51 PM. (History)    pt has stopped all drops except brim, pt saw Dr. Kathlen Mody a few months ago and daughter states Dr. Kathlen Mody was okay with pts pressure and pressure drop   Referring physician: Hortencia Pilar, MD [place],  [place] [address]  HISTORICAL INFORMATION:   Selected notes from the MEDICAL RECORD NUMBER Referred by Dr. Parke Simmers for retina eval   CURRENT MEDICATIONS: Current Outpatient Medications (Ophthalmic Drugs)  Medication Sig  . brimonidine (ALPHAGAN) 0.2 % ophthalmic solution Place 1 drop into both eyes 2 (two) times daily.  . brimonidine (ALPHAGAN) 0.2 % ophthalmic solution Place 1 drop into both eyes 2 (two) times daily.  . Bromfenac Sodium (PROLENSA) 0.07 % SOLN Place 1 drop into the right eye 4 (four) times daily.  Marland Kitchen latanoprost (XALATAN) 0.005 % ophthalmic solution Place 1 drop into both eyes at bedtime. (Patient not taking: Reported on 07/20/2020)  . prednisoLONE acetate (PRED FORTE) 1 % ophthalmic suspension Place 1 drop into the right eye 4 (four) times daily.   No current facility-administered medications for this visit. (Ophthalmic Drugs)   Current Outpatient Medications (Other)  Medication Sig  . acetaminophen (TYLENOL) 500 MG tablet Take 500 mg  by mouth as needed for mild pain.   Marland Kitchen amLODipine (NORVASC) 5 MG tablet Take 1 tablet by mouth once daily  . carvedilol (COREG) 6.25 MG tablet Take 1 tablet by mouth twice daily  . ELIQUIS 5 MG TABS tablet Take 1 tablet by mouth twice daily  . fluticasone (FLONASE) 50 MCG/ACT nasal spray 1 spray in each nostril  . Multiple Vitamin (MULTI-VITAMIN PO) Take 1 tablet by mouth daily.  . Omega-3 Fatty Acids (FISH OIL) 1000 MG CAPS Take 1,000 mg by mouth as needed (for added supplement).    No current facility-administered medications for this visit. (Other)      REVIEW OF SYSTEMS: ROS    Positive for: Genitourinary, Cardiovascular, Eyes   Negative for: Constitutional, Gastrointestinal, Neurological, Skin, Musculoskeletal, HENT, Endocrine, Respiratory, Psychiatric, Allergic/Imm, Heme/Lymph   Last edited by Doneen Poisson on 11/09/2020  9:32 AM. (History)       ALLERGIES No Known Allergies  PAST MEDICAL HISTORY Past Medical History:  Diagnosis Date  . Cataract    Mixed form OU  . Chronic kidney disease    ACE inhibitor and HCTZ DCd in past due to rising Creatinine  . Hypertension    [place] 6/19:  neg for RA stenosis bilat; normal mesenteric arteries  . Hypertensive retinopathy    OU  . Permanent atrial fibrillation    s/p DCCV 04/2017 >> failed //  CHADS2-VASc=2 >> Apixaban Rx   Past Surgical History:  Procedure Laterality Date  . CARDIOVERSION N/A 04/30/2017   Procedure: CARDIOVERSION;  Surgeon: Josue Hector, MD;  Location: [facility] ENDOSCOPY;  Service: Cardiovascular;  Laterality: N/A;    FAMILY HISTORY History reviewed. No pertinent family history.  SOCIAL HISTORY Social History   Tobacco Use  . Smoking status: Never Smoker  . Smokeless tobacco: Never Used  Substance Use Topics  . Alcohol use: No  . Drug use: No         OPHTHALMIC EXAM:  Base Eye Exam    Visual Acuity (Snellen - Linear)      Right Left   Dist [place] 20/20 -2 20/20 -1       Tonometry (Tonopen, 9:35  AM)      Right Left   Pressure 21 21       Pupils      Dark Light Shape React APD   Right 2 1 Round Minimal 0   Left 2 1 Round Minimal 0       Visual Fields      Left Right    Full Full       Extraocular Movement      Right Left    Full Full       Neuro/Psych    Oriented x3: Yes   Mood/Affect: Normal       Dilation    Both eyes: 1.0% Mydriacyl, 2.5% Phenylephrine @ 9:35 AM        Slit Lamp and Fundus Exam    Slit Lamp Exam      Right Left   Lids/Lashes Dermatochalasis - upper lid, Meibomian gland dysfunction Dermatochalasis - upper lid, Meibomian gland dysfunction   Conjunctiva/Sclera Mild Melanosis Mild Melanosis   Cornea Trace Punctate epithelial erosions, mild Debris in tear film Trace tear film debris   Anterior Chamber Deep, 1+ pigment Deep and quiet   Iris Round and moderately dilated, pigment clump at 0800 midzone Round and moderately dilated   Lens 2-3+ Nuclear sclerosis, 2-3+ Cortical cataract, Pseudoexfoliative material on anterior capsule 2+ Nuclear sclerosis, 2-3+ Cortical cataract, early pseudoexfoliation    Vitreous Mild Vitreous syneresis, Posterior vitreous detachment Mild Vitreous syneresis, vitreous condensations       Fundus Exam      Right Left   Disc Pink and Sharp, +cupping Pink and Sharp, +cupping   C/D Ratio 0.7 0.6   Macula Flat, Blunted foveal reflex, focal IRH/MA IN to fovea with surrounding cystic changes -- central cyst, slightly improved from prior, RPE mottling and clumping, no discreet targets for focal laser Flat, blunted foveal reflex, mild RPE mottling, rare MA, no edema   Vessels attenuated, mild tortuousity attenuated, tortuousity, mild Copper wiring, AV crossing changes   Periphery Attached, No heme  Attached, No heme         Refraction    Wearing Rx      Sphere Cylinder Axis   Right [place] +0.50 170   Left -0.75 +0.75 170          IMAGING AND PROCEDURES  Imaging and Procedures for _0 @  OCT, Retina - OU - Both  Eyes       Right Eye Quality was good. Central Foveal Thickness: 374. Progression has improved. Findings include intraretinal fluid, no SRF, abnormal foveal contour (Mild Interval improvement in central cystic changes).   Left Eye Quality was good. Central Foveal Thickness: 264. Progression has been stable. Findings include normal foveal contour, no IRF, no SRF (  Trace focal IRHM ).   Notes *Images captured and stored on drive  Diagnosis / Impression:  OD: abnormal foveal contour, mild interval improvement in central cysts OS: NFP, no IRF/SRF; tr focal IRHM  Clinical management:  See below  Abbreviations: NFP - Normal foveal profile. CME - cystoid macular edema. PED - pigment epithelial detachment. IRF - intraretinal fluid. SRF - subretinal fluid. EZ - ellipsoid zone. ERM - epiretinal membrane. ORA - outer retinal atrophy. ORT - outer retinal tubulation. SRHM - subretinal hyper-reflective material                 ASSESSMENT/PLAN:    ICD-10-CM   1. Essential hypertension  I10   2. Hypertensive retinopathy of both eyes  H35.033   3. Retinal edema  H35.81 OCT, Retina - OU - Both Eyes  4. Retinal hemorrhage of both eyes  H35.63   5. Combined forms of age-related cataract of both eyes  H25.813   6. 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 Hypertensive retinopathy with retinal hemorrhage OU and retinal edema OD  - BCVA remains good (20/20 OU) and pt essentially asymptomatic  - original OCT showed focal IRF/IRHM OD; OS with mild IRHM, no edema  - FA (01.14.21) shows focal, parafoveal hyperfluorescent leakage OU (OD >> OS)  - BP at initial visit 140s / 99 and 96 in R and L arms respectively  - suspect hemorrhage and edema related to HTN and use of eliquis for A fib  - BP meds adjusted and BP improved  - exam and OCT today shows interval improvement in IRF and cystic changes OD, and pt remains 20/20 and asymptomatic  OD  - review of OCTs dating back to initial visit shows waxing and waning of IRF/edema over time, but last several OCTs have been progressively worse  - repeat FA 1.11.22 shows mild improvement in perifoveal leakage OD  - pt's daughter reports development of macular edema around same time pt started latanoprost  - ?PGA-induced edema  - stopped latanoprost and started brimonidine BID OU -- no significant improvement  - stopped PF and Prolensa  - discussed anti-VEGF therapy to reduced macular edema  - will hold off for now  - f/u 3-4 months, sooner prn -- DFE/OCT  5. 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 Ocular hypertension  - started on Latan by Dr. Parke Simmers  - edema OD started about the same time per daughter  - switched from [place] to brim due to possible macular edema from latan  - continue brim BID OU  Ophthalmic Meds Ordered this visit:  No orders of the defined types were placed in this encounter.     Return for f/u 3-4 months, HTN ret / retinal hemorrhage.  There are no Patient Instructions on file for this visit.   Explained the diagnoses, plan, and follow up with the patient and they expressed understanding.  Patient expressed understanding of the importance of proper follow up care.   This document serves as a record of services personally performed by Gardiner Sleeper, MD, PhD. It was created on their behalf by Mitzi Hansen  Baxley, COT an ophthalmic technician. The creation of this record is the provider's dictation and/or activities during the visit.    Electronically signed by: Estill Bakes, COT 6.6.22 @ 10:57 PM   This document serves as a record of  services personally performed by Gardiner Sleeper, MD, PhD. It was created on their behalf by San Jetty. Owens Shark, OA an ophthalmic technician. The creation of this record is the provider's dictation and/or activities during the visit.    Electronically signed by: San Jetty. Marguerita Merles 06.07.2022 10:57 PM   Gardiner Sleeper, M.D., Ph.D. Diseases & Surgery of the Retina and Vitreous [facility]  I have reviewed the above documentation for accuracy and completeness, and I agree with the above. Gardiner Sleeper, M.D., Ph.D. 11/09/20 10:57 PM  Abbreviations: M myopia (nearsighted); A astigmatism; H hyperopia (farsighted); P presbyopia; Mrx spectacle prescription;  CTL contact lenses; OD right eye; OS left eye; OU both eyes  XT exotropia; ET esotropia; PEK punctate epithelial keratitis; PEE punctate epithelial erosions; DES dry eye syndrome; MGD meibomian gland dysfunction; ATs artificial tears; PFAT's preservative free artificial tears; [place] nuclear sclerotic cataract; PSC posterior subcapsular cataract; ERM epi-retinal membrane; PVD posterior vitreous detachment; RD retinal detachment; DM diabetes mellitus; DR diabetic retinopathy; NPDR non-proliferative diabetic retinopathy; PDR proliferative diabetic retinopathy; CSME clinically significant macular edema; DME diabetic macular edema; dbh dot blot hemorrhages; CWS cotton wool spot; POAG primary open angle glaucoma; C/D cup-to-disc ratio; HVF humphrey visual field; GVF goldmann visual field; OCT optical coherence tomography; IOP intraocular pressure; BRVO Branch retinal vein occlusion; CRVO central retinal vein occlusion; CRAO central retinal artery occlusion; BRAO branch retinal artery occlusion; RT retinal tear; SB scleral buckle; PPV pars plana vitrectomy; VH Vitreous hemorrhage; PRP panretinal laser photocoagulation; IVK intravitreal kenalog; VMT vitreomacular traction; MH Macular hole;  NVD neovascularization of the disc; NVE  neovascularization elsewhere; AREDS age related eye disease study; ARMD age related macular degeneration; POAG primary open angle glaucoma; EBMD epithelial/anterior basement membrane dystrophy; ACIOL anterior chamber intraocular lens; IOL intraocular lens; PCIOL posterior chamber intraocular lens; Phaco/IOL phacoemulsification with intraocular lens placement; [place] photorefractive keratectomy; LASIK laser assisted in situ keratomileusis; HTN hypertension; DM diabetes mellitus; COPD chronic obstructive pulmonary disease

## 2020-11-09 ENCOUNTER — Ambulatory Visit (INDEPENDENT_AMBULATORY_CARE_PROVIDER_SITE_OTHER): Payer: Medicare Other | Admitting: Ophthalmology

## 2020-11-09 ENCOUNTER — Other Ambulatory Visit: Payer: Self-pay

## 2020-11-09 ENCOUNTER — Encounter (INDEPENDENT_AMBULATORY_CARE_PROVIDER_SITE_OTHER): Payer: Self-pay | Admitting: Ophthalmology

## 2020-11-09 DIAGNOSIS — H268 Other specified cataract: Secondary | ICD-10-CM | POA: Diagnosis not present

## 2020-11-09 DIAGNOSIS — H3581 Retinal edema: Secondary | ICD-10-CM | POA: Diagnosis not present

## 2020-11-09 DIAGNOSIS — H40053 Ocular hypertension, bilateral: Secondary | ICD-10-CM

## 2020-11-09 DIAGNOSIS — H3563 Retinal hemorrhage, bilateral: Secondary | ICD-10-CM | POA: Diagnosis not present

## 2020-11-09 DIAGNOSIS — I10 Essential (primary) hypertension: Secondary | ICD-10-CM

## 2020-11-09 DIAGNOSIS — H35033 Hypertensive retinopathy, bilateral: Secondary | ICD-10-CM

## 2020-11-09 DIAGNOSIS — H25813 Combined forms of age-related cataract, bilateral: Secondary | ICD-10-CM | POA: Diagnosis not present

## 2020-11-16 DIAGNOSIS — I10 Essential (primary) hypertension: Secondary | ICD-10-CM | POA: Diagnosis not present

## 2020-11-16 DIAGNOSIS — I4821 Permanent atrial fibrillation: Secondary | ICD-10-CM | POA: Diagnosis not present

## 2020-11-16 DIAGNOSIS — H35033 Hypertensive retinopathy, bilateral: Secondary | ICD-10-CM | POA: Diagnosis not present

## 2020-11-16 DIAGNOSIS — N1831 Chronic kidney disease, stage 3a: Secondary | ICD-10-CM | POA: Diagnosis not present

## 2021-01-06 ENCOUNTER — Other Ambulatory Visit (HOSPITAL_COMMUNITY): Payer: Self-pay | Admitting: Physician Assistant

## 2021-01-06 NOTE — Telephone Encounter (Signed)
Pt last saw Tereso Newcomer, [place] on 01/27/20, pt is due for follow-up.  Pt has an appt scheduled to see Dr Tenny Craw on 04/05/21. Last labs 02/13/20 Creat 1.35, will need repeat labwork done at that time too. Age 70, weight 83.9kg, based on specified criteria pt is on appropriate dosage of Eliquis 5mg  BID.  Will refill rx to get pt to upcoming appt with Dr in November.

## 2021-03-10 NOTE — Progress Notes (Signed)
[facility] - Clinic Note  03/15/2021     CHIEF COMPLAINT Patient presents for Retina Follow Up   HISTORY OF PRESENT ILLNESS: Billy Harrell is a 70 y.o. male who presents to the clinic today for:   HPI     Retina Follow Up   Patient presents with  Other.  In both eyes.  This started months ago.  Severity is moderate.  Duration of months.  Since onset it is stable.  I, the attending physician,  performed the HPI with the patient and updated documentation appropriately.        Comments   Pt states vision is the same OU.  Pt denies eye pain or discomfort and denies new or worsening floaters or fol OU.      Last edited by Zamora, Brian, MD on 03/15/2021 12:35 PM.     Pt feels like OD vision has decreased  Referring physician: Weaver, Christopher D, MD [address] [place],  [place] [address]  HISTORICAL INFORMATION:   Selected notes from the medical record:  Referred by Dr. DeMarco for retina eval   CURRENT MEDICATIONS: Current Outpatient Medications (Ophthalmic Drugs)  Medication Sig   brimonidine (ALPHAGAN) 0.2 % ophthalmic solution Place 1 drop into both eyes 2 (two) times daily.   brimonidine (ALPHAGAN) 0.2 % ophthalmic solution Place 1 drop into both eyes 2 (two) times daily.   Bromfenac Sodium (PROLENSA) 0.07 % SOLN Place 1 drop into the right eye 4 (four) times daily.   latanoprost (XALATAN) 0.005 % ophthalmic solution Place 1 drop into both eyes at bedtime. (Patient not taking: Reported on 07/20/2020)   prednisoLONE acetate (PRED FORTE) 1 % ophthalmic suspension Place 1 drop into the right eye 4 (four) times daily.   No current facility-administered medications for this visit. (Ophthalmic Drugs)   Current Outpatient Medications (Other)  Medication Sig   acetaminophen (TYLENOL) 500 MG tablet Take 500 mg by mouth as needed for mild pain.    amLODipine (NORVASC) 5 MG tablet Take 1 tablet by mouth once daily   apixaban (ELIQUIS) 5  MG TABS tablet Take 1 tablet by mouth twice daily.  Keep scheduled follow-up with Dr Ross for FUTURE refills.  Will need Labwork repeated as well.   carvedilol (COREG) 6.25 MG tablet Take 1 tablet by mouth twice daily   fluticasone (FLONASE) 50 MCG/ACT nasal spray 1 spray in each nostril   Multiple Vitamin (MULTI-VITAMIN PO) Take 1 tablet by mouth daily.   Omega-3 Fatty Acids (FISH OIL) 1000 MG CAPS Take 1,000 mg by mouth as needed (for added supplement).    No current facility-administered medications for this visit. (Other)      REVIEW OF SYSTEMS: ROS   Positive for: Genitourinary, Cardiovascular, Eyes Negative for: Constitutional, Gastrointestinal, Neurological, Skin, Musculoskeletal, HENT, Endocrine, Respiratory, Psychiatric, Allergic/Imm, Heme/Lymph Last edited by English, Ashley L on 03/15/2021  9:43 AM.        ALLERGIES No Known Allergies  PAST MEDICAL HISTORY Past Medical History:  Diagnosis Date   Cataract    Mixed form OU   Chronic kidney disease    ACE inhibitor and HCTZ DCd in past due to rising Creatinine   Hypertension    US 6/19:  neg for RA stenosis bilat; normal mesenteric arteries   Hypertensive retinopathy    OU   Permanent atrial fibrillation    s/p DCCV 04/2017 >> failed // CHADS2-VASc=2 >> Apixaban Rx   Past Surgical History:  Procedure Laterality Date     CARDIOVERSION N/A 04/30/2017   Procedure: CARDIOVERSION;  Surgeon: Josue Hector, MD;  Location: [facility] ENDOSCOPY;  Service: Cardiovascular;  Laterality: N/A;    FAMILY HISTORY History reviewed. No pertinent family history.  SOCIAL HISTORY Social History   Tobacco Use   Smoking status: Never   Smokeless tobacco: Never  Substance Use Topics   Alcohol use: No   Drug use: No         OPHTHALMIC EXAM:  Base Eye Exam     Visual Acuity (Snellen - Linear)       Right Left   Dist [place] 20/30 -2 20/20 -2   Dist ph [place] NI          Tonometry (Tonopen, 9:52 AM)       Right Left    Pressure 25 23         Pupils       Dark Light Shape React APD   Right 3 2 Round Minimal 0   Left 3 2 Round Minimal 0         Visual Fields       Left Right    Full Full         Extraocular Movement       Right Left    Full Full         Neuro/Psych     Oriented x3: Yes   Mood/Affect: Normal         Dilation     Both eyes: 1.0% Mydriacyl, 2.5% Phenylephrine @ 9:52 AM           Slit Lamp and Fundus Exam     Slit Lamp Exam       Right Left   Lids/Lashes Dermatochalasis - upper lid, Meibomian gland dysfunction Dermatochalasis - upper lid, Meibomian gland dysfunction   Conjunctiva/Sclera Mild Melanosis Mild Melanosis   Cornea 1-2+Punctate epithelial erosions, mild Debris in tear film Trace tear film debris, 1+ Punctate epithelial erosions   Anterior Chamber Deep, 1+ pigment Deep and quiet   Iris Round and moderately dilated, pigment clump at 0800 midzone Round and moderately dilated   Lens 2-3+ Nuclear sclerosis, 2-3+ Cortical cataract, Pseudoexfoliative material on anterior capsule 2+ Nuclear sclerosis, 2-3+ Cortical cataract, early pseudoexfoliation    Vitreous Mild Vitreous syneresis, Posterior vitreous detachment Mild Vitreous syneresis, vitreous condensations         Fundus Exam       Right Left   Disc Pink and Sharp, +cupping Pink and Sharp, +cupping   C/D Ratio 0.7 0.6   Macula Flat, Blunted foveal reflex, central IRH and edema slightly increased from prior, RPE mottling and clumping, no discreet targets for focal laser Flat, blunted foveal reflex, mild RPE mottling, rare MA, no edema   Vessels attenuated, mild tortuousity attenuated, tortuousity, mild Copper wiring, AV crossing changes   Periphery Attached, No heme  Attached, No heme            Refraction     Wearing Rx       Sphere Cylinder Axis   Right [place] +0.50 170   Left -0.75 +0.75 170            IMAGING AND PROCEDURES  Imaging and Procedures for _0 @  OCT,  Retina - OU - Both Eyes       Right Eye Quality was good. Central Foveal Thickness: 374. Progression has worsened. Findings include intraretinal fluid, no SRF, abnormal foveal contour (Mild Interval increase in central cystic changes / edema).  Left Eye Quality was good. Central Foveal Thickness: 265. Progression has been stable. Findings include normal foveal contour, no IRF, no SRF (Trace focal IRHM ).   Notes *Images captured and stored on drive  Diagnosis / Impression:  OD: abnormal foveal contour, mild interval increase in central cysts / edema OS: NFP, no IRF/SRF; tr focal IRHM  Clinical management:  See below  Abbreviations: NFP - Normal foveal profile. CME - cystoid macular edema. PED - pigment epithelial detachment. IRF - intraretinal fluid. SRF - subretinal fluid. EZ - ellipsoid zone. ERM - epiretinal membrane. ORA - outer retinal atrophy. ORT - outer retinal tubulation. SRHM - subretinal hyper-reflective material      Intravitreal Injection, Pharmacologic Agent - OD - Right Eye       Time Out 03/15/2021. 10:23 AM. Confirmed correct patient, procedure, site, and patient consented.   Anesthesia Topical anesthesia was used. Anesthetic medications included Lidocaine 2%, Proparacaine 0.5%.   Procedure A supplied needle was used.   Injection: 1.25 mg Bevacizumab 1.39m/0.05ml   Route: Intravitreal, Site: Right Eye   NDC: 50242-060-01, Lot:: [id number] Expiration date: 04/27/2021, Waste: 0.05 mL   Post-op Post injection exam found visual acuity of at least counting fingers. The patient tolerated the procedure well. There were no complications. The patient received written and verbal post procedure care education.               ASSESSMENT/PLAN:    ICD-10-CM   1. Essential hypertension  I10     2. Hypertensive retinopathy of both eyes  H35.033 Intravitreal Injection, Pharmacologic Agent - OD - Right Eye    Bevacizumab (AVASTIN) SOLN 1.25 mg    3. Retinal  edema  H35.81 OCT, Retina - OU - Both Eyes    4. Retinal hemorrhage of both eyes  H35.63 Intravitreal Injection, Pharmacologic Agent - OD - Right Eye    Bevacizumab (AVASTIN) SOLN 1.25 mg    5. Combined forms of age-related cataract of both eyes  H25.813     6. 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 Branch retinal vein occlusion of right eye with macular edema  H34.8310 Intravitreal Injection, Pharmacologic Agent - OD - Right Eye    Bevacizumab (AVASTIN) SOLN 1.25 mg    1-4. Hypertensive retinopathy with retinal hemorrhage OU and retinal edema OD  - original OCT showed focal IRF/IRHM OD; OS with mild IRHM, no edema  - FA (01.14.21) shows focal, parafoveal hyperfluorescent leakage OU (OD >> OS)  - BP at initial visit 140s / 99 and 96 in R and L arms respectively  - suspect hemorrhage and edema related to HTN and use of eliquis for A fib  - differential includes mild, old BRVO w/ CME OD -- very similar in appearance to DME, but pt does not carry diagnosis of DM  - BP meds adjusted and BP improved  - exam and OCT today shows interval increase in IRF and cystic changes OD, and BCVA OD decreased to 20/30 from 20/20  - pt reports noticeably blurred vision OD  - review of OCTs dating back to initial visit shows waxing and waning of IRF/edema over time, but last several OCTs have been progressively worse  - repeat FA 1.11.22 showed mild improvement in perifoveal leakage OD  - pt's daughter reports development of macular edema around same time pt started latanoprost  - ?PGA-induced edema  - stopped latanoprost and started brimonidine  BID OU -- no significant improvement  - stopped PF and Prolensa  - discussed anti-VEGF therapy to reduced macular edema  - recommend IVA OD today, 10.11.22  - pt wishes to proceed with laser  - RBA of procedure discussed, questions answered - informed consent obtained and signed - see procedure  note  - f/u 4 weeks, sooner prn -- DFE/OCT  5. 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 Ocular hypertension OU  - IOP 25, 23 today  - started on Latan by Dr. DeMarco  - edema OD started about the same time per daughter  - switched from Latan to brim due to possible macular edema from latan  - continue brim BID OU  - pt scheduled to see Dr. Weaver next week  Ophthalmic Meds Ordered this visit:  Meds ordered this encounter  Medications   Bevacizumab (AVASTIN) SOLN 1.25 mg       Return in about 4 weeks (around 04/12/2021) for f/u HTN ret OD, DFE, OCT.  There are no Patient Instructions on file for this visit.   Explained the diagnoses, plan, and follow up with the patient and they expressed understanding.  Patient expressed understanding of the importance of proper follow up care.   This document serves as a record of services personally performed by Brian G. Zamora, MD, PhD. It was created on their behalf by Andrew Baxley, COT an ophthalmic technician. The creation of this record is the provider's dictation and/or activities during the visit.    Electronically signed by: Andrew Baxley, COT 10.6.22 @ 12:47 PM   This document serves as a record of services personally performed by Brian G. Zamora, MD, PhD. It was created on their behalf by Amanda J. Brown, OA an ophthalmic technician. The creation of this record is the provider's dictation and/or activities during the visit.    Electronically signed by: Amanda J. Brown, OA 10.11.2022 12:47 PM   Brian G. Zamora, M.D., Ph.D. Diseases & Surgery of the Retina and Vitreous [facility]  I have  reviewed the above documentation for accuracy and completeness, and I agree with the above. Brian G. Zamora, M.D., Ph.D. 03/15/21 12:47 PM   Abbreviations: M myopia (nearsighted); A astigmatism; H hyperopia (farsighted); P presbyopia; Mrx spectacle prescription;  CTL contact lenses; OD right eye; OS left eye; OU both eyes  XT exotropia; ET esotropia; PEK punctate epithelial keratitis; PEE punctate epithelial erosions; DES dry eye syndrome; MGD meibomian gland dysfunction; ATs artificial tears; PFAT's preservative free artificial tears; NSC nuclear sclerotic cataract; PSC posterior subcapsular cataract; ERM epi-retinal membrane; PVD posterior vitreous detachment; RD retinal detachment; DM diabetes mellitus; DR diabetic retinopathy; NPDR non-proliferative diabetic retinopathy; PDR proliferative diabetic retinopathy; CSME clinically significant macular edema; DME diabetic macular edema; dbh dot blot hemorrhages; CWS cotton wool spot; POAG primary open angle glaucoma; C/D cup-to-disc ratio; HVF humphrey visual field; GVF goldmann visual field; OCT optical coherence tomography; IOP intraocular pressure; BRVO Branch retinal vein occlusion; CRVO central retinal vein occlusion; CRAO central retinal artery occlusion; BRAO branch retinal artery occlusion; RT retinal tear; SB scleral buckle; PPV pars plana vitrectomy; VH Vitreous hemorrhage; PRP panretinal laser photocoagulation; IVK intravitreal kenalog; VMT vitreomacular   traction; MH Macular hole;  NVD neovascularization of the disc; NVE neovascularization elsewhere; AREDS age related eye disease study; ARMD age related macular degeneration; POAG primary open angle glaucoma; EBMD epithelial/anterior basement membrane dystrophy; ACIOL anterior chamber intraocular lens; IOL intraocular lens; PCIOL posterior chamber intraocular lens; Phaco/IOL phacoemulsification with intraocular lens placement; [place] photorefractive keratectomy; LASIK laser assisted in situ keratomileusis;  HTN hypertension; DM diabetes mellitus; COPD chronic obstructive pulmonary disease

## 2021-03-15 ENCOUNTER — Encounter (INDEPENDENT_AMBULATORY_CARE_PROVIDER_SITE_OTHER): Payer: Self-pay | Admitting: Ophthalmology

## 2021-03-15 ENCOUNTER — Other Ambulatory Visit: Payer: Self-pay

## 2021-03-15 ENCOUNTER — Ambulatory Visit (INDEPENDENT_AMBULATORY_CARE_PROVIDER_SITE_OTHER): Payer: Medicare Other | Admitting: Ophthalmology

## 2021-03-15 DIAGNOSIS — H3563 Retinal hemorrhage, bilateral: Secondary | ICD-10-CM | POA: Diagnosis not present

## 2021-03-15 DIAGNOSIS — H35033 Hypertensive retinopathy, bilateral: Secondary | ICD-10-CM

## 2021-03-15 DIAGNOSIS — H40053 Ocular hypertension, bilateral: Secondary | ICD-10-CM | POA: Diagnosis not present

## 2021-03-15 DIAGNOSIS — I10 Essential (primary) hypertension: Secondary | ICD-10-CM

## 2021-03-15 DIAGNOSIS — H25813 Combined forms of age-related cataract, bilateral: Secondary | ICD-10-CM

## 2021-03-15 DIAGNOSIS — H3581 Retinal edema: Secondary | ICD-10-CM

## 2021-03-15 DIAGNOSIS — H268 Other specified cataract: Secondary | ICD-10-CM | POA: Diagnosis not present

## 2021-03-15 DIAGNOSIS — H34831 Tributary (branch) retinal vein occlusion, right eye, with macular edema: Secondary | ICD-10-CM

## 2021-03-15 MED ORDER — BEVACIZUMAB CHEMO INJECTION 1.25MG/0.05ML SYRINGE FOR KALEIDOSCOPE
1.2500 mg | INTRAVITREAL | Status: AC | PRN
  Administered 2021-03-15: 1.25 mg via INTRAVITREAL

## 2021-03-22 DIAGNOSIS — H401421 Capsular glaucoma with pseudoexfoliation of lens, left eye, mild stage: Secondary | ICD-10-CM | POA: Diagnosis not present

## 2021-03-22 DIAGNOSIS — H401412 Capsular glaucoma with pseudoexfoliation of lens, right eye, moderate stage: Secondary | ICD-10-CM | POA: Diagnosis not present

## 2021-04-05 ENCOUNTER — Other Ambulatory Visit: Payer: Self-pay

## 2021-04-05 ENCOUNTER — Ambulatory Visit: Payer: Medicare Other | Admitting: Internal Medicine

## 2021-04-05 ENCOUNTER — Encounter: Payer: Self-pay | Admitting: Internal Medicine

## 2021-04-05 VITALS — BP 130/90 | HR 72 | Ht 70.0 in | Wt 181.0 lb

## 2021-04-05 DIAGNOSIS — I10 Essential (primary) hypertension: Secondary | ICD-10-CM | POA: Diagnosis not present

## 2021-04-05 DIAGNOSIS — I4819 Other persistent atrial fibrillation: Secondary | ICD-10-CM | POA: Diagnosis not present

## 2021-04-05 LAB — LIPID PANEL
Chol/HDL Ratio: 4.2 ratio (ref 0.0–5.0)
Cholesterol, Total: 168 mg/dL (ref 100–199)
HDL: 40 mg/dL (ref >39)
LDL Chol Calc (NIH): 114 mg/dL — ABNORMAL HIGH (ref 0–99)
Triglycerides: 72 mg/dL (ref 0–149)
VLDL Cholesterol Cal: 14 mg/dL (ref 5–40)

## 2021-04-05 LAB — BASIC METABOLIC PANEL
BUN/Creatinine Ratio: 15 (ref 10–24)
BUN: 22 mg/dL (ref 8–27)
CO2: 27 mmol/L (ref 20–29)
Calcium: 9.1 mg/dL (ref 8.6–10.2)
Chloride: 103 mmol/L (ref 96–106)
Creatinine, Ser: 1.5 mg/dL — ABNORMAL HIGH (ref 0.76–1.27)
Glucose: 93 mg/dL (ref 70–99)
Potassium: 4.3 mmol/L (ref 3.5–5.2)
Sodium: 140 mmol/L (ref 134–144)
eGFR: 49 mL/min/{1.73_m2} — ABNORMAL LOW (ref >59)

## 2021-04-05 LAB — CBC
Hematocrit: 39.2 % (ref 37.5–51.0)
Hemoglobin: 13.1 g/dL (ref 13.0–17.7)
MCH: 28.2 pg (ref 26.6–33.0)
MCHC: 33.4 g/dL (ref 31.5–35.7)
MCV: 84 fL (ref 79–97)
Platelets: 172 10*3/uL (ref 150–450)
RBC: 4.65 x10E6/uL (ref 4.14–5.80)
RDW: 12.8 % (ref 11.6–15.4)
WBC: 3.7 10*3/uL (ref 3.4–10.8)

## 2021-04-05 NOTE — Patient Instructions (Signed)
Medication Instructions:  Your physician recommends that you continue on your current medications as directed. Please refer to the Current Medication list given to you today.  *If you need a refill on your cardiac medications before your next appointment, please call your pharmacy*   Lab Work: TODAY: BMET, CBC, FLP  If you have labs (blood work) drawn today and your tests are completely normal, you will receive your results only by: MyChart Message (if you have MyChart) OR A paper copy in the mail If you have any lab test that is abnormal or we need to change your treatment, we will call you to review the results.   Follow-Up: At [facility], you and your health needs are our priority.  As part of our continuing mission to provide you with exceptional heart care, we have created designated Provider Care Teams.  These Care Teams include your primary Cardiologist (physician) and Advanced Practice Providers (APPs -  Physician Assistants and Nurse Practitioners) who all work together to provide you with the care you need, when you need it.   Your next appointment:   August 2023  The format for your next appointment:   In Person  Provider:   You may see Dietrich Pates, MD or one of the following Advanced Practice Providers on your designated Care Team:   Tereso Newcomer, PA-C [place], [place]

## 2021-04-05 NOTE — Progress Notes (Addendum)
[facility] - Clinic Note  04/12/2021     CHIEF COMPLAINT Patient presents for Retina Follow Up   HISTORY OF PRESENT ILLNESS: Billy Harrell is a 70 y.o. male who presents to the clinic today for:   HPI     Retina Follow Up   Patient presents with  Other.  In right eye.  Duration of 4 weeks.  Since onset it is stable.  I, the attending physician,  performed the HPI with the patient and updated documentation appropriately.        Comments   Pt states vision is "okay", he sees occasional floaters, but no fol, pt states he is using a blue top eye drop      Last edited by Rennis Chris, MD on 04/12/2021  8:50 AM.    Pt saw Dr. Alben Spittle 2 weeks ago and was told to stop using brimonidine, but to use Cosopt instead    Referring physician: Diona Foley, MD [address] [place],  [place] [address]  HISTORICAL INFORMATION:   Selected notes from the MEDICAL RECORD NUMBER Referred by Dr. Krista Blue for retina eval   CURRENT MEDICATIONS: Current Outpatient Medications (Ophthalmic Drugs)  Medication Sig   brimonidine (ALPHAGAN) 0.2 % ophthalmic solution Place 1 drop into both eyes 2 (two) times daily.   dorzolamide-timolol (COSOPT) 22.3-6.8 MG/ML ophthalmic solution 1 drop 2 (two) times daily.   No current facility-administered medications for this visit. (Ophthalmic Drugs)   Current Outpatient Medications (Other)  Medication Sig   acetaminophen (TYLENOL) 500 MG tablet Take 500 mg by mouth as needed for mild pain.    amLODipine (NORVASC) 5 MG tablet Take 1 tablet by mouth once daily   apixaban (ELIQUIS) 5 MG TABS tablet Take 1 tablet by mouth twice daily.  Keep scheduled follow-up with Dr Tenny Craw for FUTURE refills.  Will need Labwork repeated as well.   carvedilol (COREG) 6.25 MG tablet Take 1 tablet by mouth twice daily   fluticasone (FLONASE) 50 MCG/ACT nasal spray 1 spray in each nostril   Multiple Vitamin (MULTI-VITAMIN PO) Take 1 tablet by mouth daily.    Omega-3 Fatty Acids (FISH OIL) 1000 MG CAPS Take 1,000 mg by mouth as needed (for added supplement).    tamsulosin (FLOMAX) 0.4 MG CAPS capsule Take 0.4 mg by mouth daily.   No current facility-administered medications for this visit. (Other)      REVIEW OF SYSTEMS: ROS   Positive for: Endocrine, Cardiovascular, Eyes Negative for: Constitutional, Gastrointestinal, Neurological, Skin, Genitourinary, Musculoskeletal, HENT, Respiratory, Psychiatric, Allergic/Imm, Heme/Lymph Last edited by Posey Boyer, COT on 04/12/2021  8:37 AM.         ALLERGIES No Known Allergies  PAST MEDICAL HISTORY Past Medical History:  Diagnosis Date   Cataract    Mixed form OU   Chronic kidney disease    ACE inhibitor and HCTZ DCd in past due to rising Creatinine   Hypertension    [place] 6/19:  neg for RA stenosis bilat; normal mesenteric arteries   Hypertensive retinopathy    OU   Permanent atrial fibrillation    s/p DCCV 04/2017 >> failed // CHADS2-VASc=2 >> Apixaban Rx   Past Surgical History:  Procedure Laterality Date   CARDIOVERSION N/A 04/30/2017   Procedure: CARDIOVERSION;  Surgeon: Wendall Stade, MD;  Location: [facility] ENDOSCOPY;  Service: Cardiovascular;  Laterality: N/A;    FAMILY HISTORY History reviewed. No pertinent family history.  SOCIAL HISTORY Social History   Tobacco Use   Smoking  status: Never   Smokeless tobacco: Never  Substance Use Topics   Alcohol use: No   Drug use: No         OPHTHALMIC EXAM:  Base Eye Exam     Visual Acuity (Snellen - Linear)       Right Left   Dist [place] 20/30 -2 20/20   Dist ph [place] 20/25 -2          Tonometry (Tonopen, 8:43 AM)       Right Left   Pressure 28 30         Tonometry #2 (Tonopen, 8:43 AM)       Right Left   Pressure 27 32         Tonometry Comments   Given 1 gtt brim and cosopt at 846        Pupils       Dark Light Shape React APD   Right 3 2 Round Minimal None   Left 3 2 Round Minimal None          Visual Fields (Counting fingers)       Left Right    Full Full         Extraocular Movement       Right Left    Full, Ortho Full, Ortho         Neuro/Psych     Oriented x3: Yes   Mood/Affect: Normal         Dilation     Both eyes: 1.0% Mydriacyl, 2.5% Phenylephrine @ 8:46 AM           Slit Lamp and Fundus Exam     Slit Lamp Exam       Right Left   Lids/Lashes Dermatochalasis - upper lid, Meibomian gland dysfunction Dermatochalasis - upper lid, Meibomian gland dysfunction   Conjunctiva/Sclera Mild Melanosis Mild Melanosis   Cornea 1-2+Punctate epithelial erosions, mild Debris in tear film Trace tear film debris, 1+ Punctate epithelial erosions   Anterior Chamber Deep, 1+ pigment Deep and quiet   Iris Round and moderately dilated, pigment clump at 0800 midzone Round and moderately dilated   Lens 2-3+ Nuclear sclerosis, 2-3+ Cortical cataract, Pseudoexfoliative material on anterior capsule 2+ Nuclear sclerosis, 2-3+ Cortical cataract, early pseudoexfoliation    Vitreous Mild Vitreous syneresis, Posterior vitreous detachment Mild Vitreous syneresis, vitreous condensations         Fundus Exam       Right Left   Disc Pink and Sharp, +cupping Pink and Sharp, +cupping   C/D Ratio 0.7 0.6   Macula Flat, Blunted foveal reflex, central IRH and edema -- improved from prior, RPE mottling and clumping, no discreet targets for focal laser Flat, blunted foveal reflex, mild RPE mottling, rare MA, no edema   Vessels attenuated, mild tortuousity attenuated, tortuousity, mild Copper wiring, AV crossing changes   Periphery Attached, No heme  Attached, No heme             IMAGING AND PROCEDURES  Imaging and Procedures for @TODAY @  OCT, Retina - OU - Both Eyes       Right Eye Quality was good. Central Foveal Thickness: 330. Progression has improved. Findings include intraretinal fluid, no SRF, abnormal foveal contour (Interval improvement in IRF/IRHM temporal  fovea).   Left Eye Quality was good. Central Foveal Thickness: 262. Progression has been stable. Findings include normal foveal contour, no IRF, no SRF (Trace focal IRHM ).   Notes *Images captured and stored on drive  Diagnosis /  Impression:  OD: abnormal foveal contour, Interval improvement in IRF/IRHM temporal fovea OS: NFP, no IRF/SRF; tr focal IRHM  Clinical management:  See below  Abbreviations: NFP - Normal foveal profile. CME - cystoid macular edema. PED - pigment epithelial detachment. IRF - intraretinal fluid. SRF - subretinal fluid. EZ - ellipsoid zone. ERM - epiretinal membrane. ORA - outer retinal atrophy. ORT - outer retinal tubulation. SRHM - subretinal hyper-reflective material      Intravitreal Injection, Pharmacologic Agent - OD - Right Eye       Time Out 04/12/2021. 9:08 AM. Confirmed correct patient, procedure, site, and patient consented.   Anesthesia Topical anesthesia was used. Anesthetic medications included Lidocaine 2%, Proparacaine 0.5%.   Procedure Preparation included 5% betadine to ocular surface, eyelid speculum. A (32g) needle was used.   Injection: 1.25 mg Bevacizumab 1.25mg /0.41ml   Route: Intravitreal, Site: Right Eye   NDC: [id number], Lot: [id number], Expiration date: 05/24/2021, Waste: 0.05 mL   Post-op Post injection exam found visual acuity of at least counting fingers. The patient tolerated the procedure well. There were no complications. The patient received written and verbal post procedure care education. Post injection medications were not given.                ASSESSMENT/PLAN:    ICD-10-CM   1. Hypertensive retinopathy of both eyes  H35.033 Intravitreal Injection, Pharmacologic Agent - OD - Right Eye    Bevacizumab (AVASTIN) SOLN 1.25 mg    2. Essential hypertension  I10     3. Retinal hemorrhage of both eyes  H35.63 Intravitreal Injection, Pharmacologic Agent - OD - Right Eye    Bevacizumab (AVASTIN) SOLN 1.25 mg     4. Retinal edema  H35.81 OCT, Retina - OU - Both Eyes    Intravitreal Injection, Pharmacologic Agent - OD - Right Eye    Bevacizumab (AVASTIN) SOLN 1.25 mg    5. Combined forms of age-related cataract of both eyes  H25.813     6. 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 Branch retinal vein occlusion of right eye with macular edema  H34.8310 Intravitreal Injection, Pharmacologic Agent - OD - Right Eye    Bevacizumab (AVASTIN) SOLN 1.25 mg     1-4. Hypertensive retinopathy with retinal hemorrhage OU and retinal edema OD  - IVA OD #1 (10.11.22)  - original OCT showed focal IRF/IRHM OD; OS with mild IRHM, no edema  - FA (01.14.21) shows focal, parafoveal hyperfluorescent leakage OU (OD >> OS)  - repeat FA 1.11.22 showed mild improvement in perifoveal leakage OD  - BP at initial visit 140s / 99 and 96 in R and L arms respectively  - suspect hemorrhage and edema related to HTN and use of eliquis for A fib  - differential includes mild, old BRVO w/ CME OD -- very similar in appearance to DME, but pt does not carry diagnosis of DM  - BP meds adjusted and BP improved  - exam and OCT today shows Interval improvement in IRF/IRHM temporal fovea, and BCVA OD improved to 20/25 from 20/30  - pt's daughter reports development of macular edema around same time pt started latanoprost  - ?PGA-induced edema  - stopped latanoprost and started brimonidine BID OU -- no significant improvement  - stopped PF and Prolensa  - recommend IVA OD #2 today, 11.08.22          - pt wishes to  proceed with injection  - RBA of procedure discussed, questions answered - IVA informed consent obtained and signed, 10.11.22 (OD) - see procedure note - f/u 4 weeks, sooner prn -- DFE/OCT, possible injection  5. 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  -  discussed diagnosis and progression  - not yet visually significant  - monitor for now  6. 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 Ocular hypertension OU  - IOP 27,32 today  - started on Latanoprost by Dr. Krista Blue  - edema OD started about the same time per daughter  - switched from Latan to brim due to possible macular edema from latan  - continue brim BID OU -- switched to Cosopt 2 weeks ago per Dr. Alben Spittle  - will make a follow up appt with [facility] due to increased IOP today  Ophthalmic Meds Ordered this visit:  Meds ordered this encounter  Medications   Bevacizumab (AVASTIN) SOLN 1.25 mg      Return in about 4 weeks (around 05/10/2021) for retinal edema OD - Dilated Exam, OCT, Possible Injxn.  There are no Patient Instructions on file for this visit.   Explained the diagnoses, plan, and follow up with the patient and they expressed understanding.  Patient expressed understanding of the importance of proper follow up care.   This document serves as a record of services personally performed by Karie Chimera, MD, PhD. It was created on their behalf by Cristopher Estimable, COT an ophthalmic technician. The creation of this record is the provider's dictation and/or activities during the visit.    Electronically signed by: Cristopher Estimable, COT 11.1.22 @ 1:06 PM   This document serves as a record of services personally performed by Karie Chimera, MD, PhD. It was created on their behalf by Glee Arvin. Manson Passey, OA an ophthalmic technician. The creation of this record is the provider's dictation and/or activities during the visit.    Electronically signed by: Glee Arvin. Manson Passey, [place] 11.08.2022 1:06 PM  Karie Chimera, M.D., Ph.D. Diseases & Surgery of the Retina and Vitreous [facility]  I have reviewed the above documentation for accuracy and completeness, and I agree  with the above. Karie Chimera, M.D., Ph.D. 04/12/21 1:06 PM   Abbreviations: M myopia (nearsighted); A astigmatism; H hyperopia (farsighted); P presbyopia; Mrx spectacle prescription;  CTL contact lenses; OD right eye; OS left eye; OU both eyes  XT exotropia; ET esotropia; PEK punctate epithelial keratitis; PEE punctate epithelial erosions; DES dry eye syndrome; MGD meibomian gland dysfunction; ATs artificial tears; PFAT's preservative free artificial tears; NSC nuclear sclerotic cataract; PSC posterior subcapsular cataract; ERM epi-retinal membrane; PVD posterior vitreous detachment; RD retinal detachment; DM diabetes mellitus; DR diabetic retinopathy; NPDR non-proliferative diabetic retinopathy; PDR proliferative diabetic retinopathy; CSME clinically significant macular edema; DME diabetic macular edema; dbh dot blot hemorrhages; CWS cotton wool spot; POAG primary open angle glaucoma; C/D cup-to-disc ratio; HVF humphrey visual field; GVF goldmann visual field; OCT optical coherence tomography; IOP intraocular pressure; BRVO Branch retinal vein occlusion; CRVO central retinal vein occlusion; CRAO central retinal artery occlusion; BRAO branch retinal artery occlusion; RT retinal tear; SB scleral buckle; PPV pars plana vitrectomy; VH Vitreous hemorrhage; PRP panretinal laser photocoagulation; IVK intravitreal kenalog; VMT vitreomacular traction; MH Macular hole;  NVD neovascularization of the disc; NVE neovascularization elsewhere;  AREDS age related eye disease study; ARMD age related macular degeneration; POAG primary open angle glaucoma; EBMD epithelial/anterior basement membrane dystrophy; ACIOL anterior chamber intraocular lens; IOL intraocular lens; PCIOL posterior chamber intraocular lens; Phaco/IOL phacoemulsification with intraocular lens placement; PRK photorefractive keratectomy; LASIK laser assisted in situ keratomileusis; HTN hypertension; DM diabetes mellitus; COPD chronic obstructive pulmonary  disease

## 2021-04-05 NOTE — Progress Notes (Signed)
Cardiology Office Note   Date:  04/05/2021   ID:  Billy Harrell, DOB 07-Sep-1950, MRN [id number]  PCP:  Billy Floro, MD  Cardiologist:   Billy Pates, MD   F/U of HTN and Atrial fib    History of Present Illness: Billy Harrell is a 70 y.o. male with a history of HTN, Permanent atrial fib and CKD     I last saw him in clnic in 2019  He has ben followed by Wende Mott since   The pt denies CP  Breathing is OK   He does say occsaionally his HR will increase quick sometimes whien climbing stairs   Goes down fairly fast        Current Meds  Medication Sig   acetaminophen (TYLENOL) 500 MG tablet Take 500 mg by mouth as needed for mild pain.    amLODipine (NORVASC) 5 MG tablet Take 1 tablet by mouth once daily   apixaban (ELIQUIS) 5 MG TABS tablet Take 1 tablet by mouth twice daily.  Keep scheduled follow-up with Dr Tenny Craw for FUTURE refills.  Will need Labwork repeated as well.   brimonidine (ALPHAGAN) 0.2 % ophthalmic solution Place 1 drop into both eyes 2 (two) times daily.   carvedilol (COREG) 6.25 MG tablet Take 1 tablet by mouth twice daily   dorzolamide-timolol (COSOPT) 22.3-6.8 MG/ML ophthalmic solution 1 drop 2 (two) times daily.   fluticasone (FLONASE) 50 MCG/ACT nasal spray 1 spray in each nostril   Multiple Vitamin (MULTI-VITAMIN PO) Take 1 tablet by mouth daily.   Omega-3 Fatty Acids (FISH OIL) 1000 MG CAPS Take 1,000 mg by mouth as needed (for added supplement).    tamsulosin (FLOMAX) 0.4 MG CAPS capsule Take 0.4 mg by mouth daily.     Allergies:   Patient has no known allergies.   Past Medical History:  Diagnosis Date   Cataract    Mixed form OU   Chronic kidney disease    ACE inhibitor and HCTZ DCd in past due to rising Creatinine   Hypertension    [place] 6/19:  neg for RA stenosis bilat; normal mesenteric arteries   Hypertensive retinopathy    OU   Permanent atrial fibrillation    s/p DCCV 04/2017 >> failed // CHADS2-VASc=2 >> Apixaban Rx    Past Surgical  History:  Procedure Laterality Date   CARDIOVERSION N/A 04/30/2017   Procedure: CARDIOVERSION;  Surgeon: Billy Stade, MD;  Location: [facility] ENDOSCOPY;  Service: Cardiovascular;  Laterality: N/A;     Social History:  The patient  reports that he has never smoked. He has never used smokeless tobacco. He reports that he does not drink alcohol and does not use drugs.   Family History:  The patient's family history is not on file.    ROS:  Please see the history of present illness. All other systems are reviewed and  Negative to the above problem except as noted.    PHYSICAL EXAM: VS:  BP 130/90   Pulse 72   Ht 5\' 10"  (1.778 m)   Wt 181 lb (82.1 kg)   SpO2 100%   BMI 25.97 kg/m   GEN: Well nourished, well developed, in no acute distress  HEENT: normal  Neck: no JVD, carotid bruits, or masses Cardiac: RRR; no murmurs, rubs, or gallops,no edema  Respiratory:  clear to auscultation bilaterally, normal work of breathing GI: soft, nontender, nondistended, + BS  No hepatomegaly  MS: no deformity Moving all extremities   Skin: warm and dry,  no rash Neuro:  Strength and sensation are intact Psych: euthymic mood, full affect   EKG:  EKG is ordered today.  Atrial fibrillation  72 bpm   Prior CV studies: 24 Hr BP Monitor 07/10/2019 24 hour BP monitor   Average BP through period is 118/79. mmHg Lowest systolic 89/  Mm Hg  Highest systolic 147/ mm Hg   Renal Artery [place] 11/07/17 No RAS bilat; normal celiac and SMA   Echo 04/06/17 Mild focal basal septal hypertrophy, EF 55-60, no RWMA, mild AI, mildly dilated Ao root (42 mm), mild MR, mod LAE, mod RAE, mild TR, PASP 32    Lipid Panel No results found for: CHOL, TRIG, HDL, CHOLHDL, VLDL, LDLCALC, LDLDIRECT    Wt Readings from Last 3 Encounters:  04/05/21 181 lb (82.1 kg)  01/27/20 185 lb (83.9 kg)  07/21/19 181 lb 6.4 oz (82.3 kg)      ASSESSMENT AND PLAN:  1  Atrial fibrilllation   Remains rate controlled    Keep on Eliquis  Check  labs  2  HTN   Follow  Diastolic a little high    3  CKD  Check labs      Current medicines are reviewed at length with the patient today.  The patient does not have concerns regarding medicines.  Signed, Billy Pates, MD  04/05/2021 8:49 AM    [facility] [address] [place], [place], [place]  [address] Phone: [phone]; Fax: [phone]

## 2021-04-12 ENCOUNTER — Ambulatory Visit (INDEPENDENT_AMBULATORY_CARE_PROVIDER_SITE_OTHER): Payer: Medicare Other | Admitting: Ophthalmology

## 2021-04-12 ENCOUNTER — Other Ambulatory Visit: Payer: Self-pay

## 2021-04-12 ENCOUNTER — Encounter (INDEPENDENT_AMBULATORY_CARE_PROVIDER_SITE_OTHER): Payer: Self-pay | Admitting: Ophthalmology

## 2021-04-12 DIAGNOSIS — H34831 Tributary (branch) retinal vein occlusion, right eye, with macular edema: Secondary | ICD-10-CM

## 2021-04-12 DIAGNOSIS — H25813 Combined forms of age-related cataract, bilateral: Secondary | ICD-10-CM

## 2021-04-12 DIAGNOSIS — I10 Essential (primary) hypertension: Secondary | ICD-10-CM | POA: Diagnosis not present

## 2021-04-12 DIAGNOSIS — H40053 Ocular hypertension, bilateral: Secondary | ICD-10-CM

## 2021-04-12 DIAGNOSIS — H3581 Retinal edema: Secondary | ICD-10-CM

## 2021-04-12 DIAGNOSIS — H3563 Retinal hemorrhage, bilateral: Secondary | ICD-10-CM | POA: Diagnosis not present

## 2021-04-12 DIAGNOSIS — H268 Other specified cataract: Secondary | ICD-10-CM

## 2021-04-12 DIAGNOSIS — H35033 Hypertensive retinopathy, bilateral: Secondary | ICD-10-CM | POA: Diagnosis not present

## 2021-04-12 MED ORDER — BEVACIZUMAB CHEMO INJECTION 1.25MG/0.05ML SYRINGE FOR KALEIDOSCOPE
1.2500 mg | INTRAVITREAL | Status: AC | PRN
  Administered 2021-04-12: 1.25 mg via INTRAVITREAL

## 2021-04-19 DIAGNOSIS — H401412 Capsular glaucoma with pseudoexfoliation of lens, right eye, moderate stage: Secondary | ICD-10-CM | POA: Diagnosis not present

## 2021-04-19 DIAGNOSIS — H401421 Capsular glaucoma with pseudoexfoliation of lens, left eye, mild stage: Secondary | ICD-10-CM | POA: Diagnosis not present

## 2021-05-05 NOTE — Progress Notes (Signed)
[facility] Note  05/10/2021     CHIEF COMPLAINT Patient presents for Retina Follow Up  HISTORY OF PRESENT ILLNESS: Billy Harrell is a 70 y.o. male who presents to the clinic today for:   HPI     Retina Follow Up   Patient presents with  Other.  In both eyes.  Duration of 4 weeks.  Since onset it is stable.  I, the attending physician,  performed the HPI with the patient and updated documentation appropriately.        Comments   4 week follow up HTN Retinopathy OU- Doing well, no new problems.  Vision appears stable. Using Cosopt BID OU.      Last edited by Bernarda Caffey, MD on 05/10/2021  8:33 AM.    Pt states he had an appt at [facility] for IOP and they said his pressure was okay, no change in drops, still using Cosopt BID OU, pt feels like injections are helping his vision   Referring physician: Lawerance Cruel, MD [place],  [place] [id number]  HISTORICAL INFORMATION:   Selected notes from the MEDICAL RECORD NUMBER Referred by Dr. Parke Simmers for retina eval   CURRENT MEDICATIONS: Current Outpatient Medications (Ophthalmic Drugs)  Medication Sig   dorzolamide-timolol (COSOPT) 22.3-6.8 MG/ML ophthalmic solution 1 drop 2 (two) times daily.   brimonidine (ALPHAGAN) 0.2 % ophthalmic solution Place 1 drop into both eyes 2 (two) times daily. (Patient not taking: Reported on 05/10/2021)   No current facility-administered medications for this visit. (Ophthalmic Drugs)   Current Outpatient Medications (Other)  Medication Sig   acetaminophen (TYLENOL) 500 MG tablet Take 500 mg by mouth as needed for mild pain.    amLODipine (NORVASC) 5 MG tablet Take 1 tablet by mouth once daily   apixaban (ELIQUIS) 5 MG TABS tablet Take 1 tablet by mouth twice daily.  Keep scheduled follow-up with Dr Harrington Challenger for FUTURE refills.  Will need Labwork repeated as well.   carvedilol (COREG) 6.25 MG tablet Take 1 tablet by mouth twice daily   fluticasone  (FLONASE) 50 MCG/ACT nasal spray 1 spray in each nostril   Multiple Vitamin (MULTI-VITAMIN PO) Take 1 tablet by mouth daily.   Omega-3 Fatty Acids (FISH OIL) 1000 MG CAPS Take 1,000 mg by mouth as needed (for added supplement).    tamsulosin (FLOMAX) 0.4 MG CAPS capsule Take 0.4 mg by mouth daily.   No current facility-administered medications for this visit. (Other)   REVIEW OF SYSTEMS: ROS   Positive for: Endocrine, Cardiovascular, Eyes Negative for: Constitutional, Gastrointestinal, Neurological, Skin, Genitourinary, Musculoskeletal, HENT, Respiratory, Psychiatric, Allergic/Imm, Heme/Lymph Last edited by Leonie Douglas, COA on 05/10/2021  7:54 AM.     ALLERGIES No Known Allergies  PAST MEDICAL HISTORY Past Medical History:  Diagnosis Date   Cataract    Mixed form OU   Chronic kidney disease    ACE inhibitor and HCTZ DCd in past due to rising Creatinine   Hypertension    [place] 6/19:  neg for RA stenosis bilat; normal mesenteric arteries   Hypertensive retinopathy    OU   Permanent atrial fibrillation    s/p DCCV 04/2017 >> failed // CHADS2-VASc=2 >> Apixaban Rx   Past Surgical History:  Procedure Laterality Date   CARDIOVERSION N/A 04/30/2017   Procedure: CARDIOVERSION;  Surgeon: Josue Hector, MD;  Location: [facility];  Service: Cardiovascular;  Laterality: N/A;    FAMILY HISTORY History reviewed. No pertinent family history.  SOCIAL  HISTORY Social History   Tobacco Use   Smoking status: Never   Smokeless tobacco: Never  Substance Use Topics   Alcohol use: No   Drug use: No       OPHTHALMIC EXAM:  Base Eye Exam     Visual Acuity (Snellen - Linear)       Right Left   Dist [place] 20/25 +2 20/20   Dist ph [place] NI          Tonometry (Tonopen, 8:00 AM)       Right Left   Pressure 17 18         Pupils       Dark Light Shape React APD   Right 2 1 Round Minimal None   Left 2 1 Round Minimal None         Visual Fields (Counting fingers)        Left Right    Full Full         Extraocular Movement       Right Left    Full Full         Neuro/Psych     Oriented x3: Yes   Mood/Affect: Normal         Dilation     Both eyes: 1.0% Mydriacyl, 2.5% Phenylephrine @ 8:00 AM           Slit Lamp and Fundus Exam     Slit Lamp Exam       Right Left   Lids/Lashes Dermatochalasis - upper lid, Meibomian gland dysfunction Dermatochalasis - upper lid, Meibomian gland dysfunction   Conjunctiva/Sclera Mild Melanosis Mild Melanosis   Cornea 1-2+Punctate epithelial erosions, mild Debris in tear film Trace tear film debris, 1+ Punctate epithelial erosions   Anterior Chamber Deep, 1+ pigment Deep and quiet   Iris Round and moderately dilated, pigment clump at 0800 midzone Round and moderately dilated   Lens 2-3+ Nuclear sclerosis, 2-3+ Cortical cataract, Pseudoexfoliative material on anterior capsule 2+ Nuclear sclerosis, 2-3+ Cortical cataract, early pseudoexfoliation    Anterior Vitreous Mild Vitreous syneresis, Posterior vitreous detachment Mild Vitreous syneresis, vitreous condensations         Fundus Exam       Right Left   Disc Pink and Sharp, +cupping Pink and Sharp, +cupping   C/D Ratio 0.7 0.6   Macula Flat, Blunted foveal reflex, central IRH and edema -- improved from prior, RPE mottling and clumping, no discreet targets for focal laser Flat, good foveal reflex, mild RPE mottling, rare MA, no edema   Vessels attenuated, mild tortuousity attenuated, tortuousity, mild Copper wiring, AV crossing changes   Periphery Attached, No heme  Attached, No heme             IMAGING AND PROCEDURES  Imaging and Procedures for $RemoveBefore'@TODAY'[id number]$ @  OCT, Retina - OU - Both Eyes       Right Eye Quality was good. Central Foveal Thickness: 300. Progression has improved. Findings include intraretinal fluid, no SRF, abnormal foveal contour (Interval improvement in IRF/cystic changes temporal fovea).   Left Eye Quality was good. Central  Foveal Thickness: 262. Progression has been stable. Findings include normal foveal contour, no IRF, no SRF (Trace focal IRHM ).   Notes *Images captured and stored on drive  Diagnosis / Impression:  OD: abnormal foveal contour, Interval improvement in IRF/cystic changes temporal fovea OS: NFP, no IRF/SRF; tr focal IRHM  Clinical management:  See below  Abbreviations: NFP - Normal foveal profile. CME - cystoid macular edema.  PED - pigment epithelial detachment. IRF - intraretinal fluid. SRF - subretinal fluid. EZ - ellipsoid zone. ERM - epiretinal membrane. ORA - outer retinal atrophy. ORT - outer retinal tubulation. SRHM - subretinal hyper-reflective material      Intravitreal Injection, Pharmacologic Agent - OD - Right Eye       Time Out 05/10/2021. 8:32 AM. Confirmed correct patient, procedure, site, and patient consented.   Anesthesia Topical anesthesia was used. Anesthetic medications included Lidocaine 2%, Proparacaine 0.5%.   Procedure Preparation included 5% betadine to ocular surface, eyelid speculum. A (32g) needle was used.   Injection: 1.25 mg Bevacizumab 1.25mg /0.53ml   Route: Intravitreal, Site: Right Eye   NDC: 50242-060-01, Lot: [id number] , Expiration date: 06/14/2021, Waste: 0.05 mL   Post-op Post injection exam found visual acuity of at least counting fingers. The patient tolerated the procedure well. There were no complications. The patient received written and verbal post procedure care education. Post injection medications were not given.            ASSESSMENT/PLAN:    ICD-10-CM   1. Essential hypertension  I10     2. Hypertensive retinopathy of both eyes  H35.033     3. Retinal hemorrhage of both eyes  H35.63     4. Retinal edema  H35.81 OCT, Retina - OU - Both Eyes    5. Combined forms of age-related cataract of both eyes  H25.813     6. 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 Branch retinal vein occlusion of right eye with macular edema  H34.8310 Intravitreal Injection, Pharmacologic Agent - OD - Right Eye    Bevacizumab (AVASTIN) SOLN 1.25 mg      1-4. Hypertensive retinopathy with retinal hemorrhage OU and retinal edema OD  - IVA OD #1 (10.11.22), #2 (11.08.22)  - original OCT showed focal IRF/IRHM OD; OS with mild IRHM, no edema  - FA (01.14.21) shows focal, parafoveal hyperfluorescent leakage OU (OD >> OS)  - repeat FA 1.11.22 showed mild improvement in perifoveal leakage OD  - BP at initial visit 140s / 99 and 96 in R and L arms respectively  - suspect hemorrhage and edema related to HTN and use of eliquis for A fib  - differential includes mild, old BRVO w/ CME OD -- very similar in appearance to DME, but pt does not carry diagnosis of DM  - BP meds adjusted and BP improved  - exam and OCT today shows Interval improvement in IRF/IRHM temporal fovea, and BCVA OD stable at +20/25   - pt's daughter reports development of macular edema around same time pt started latanoprost  - ?PGA-induced edema  - stopped latanoprost and started brimonidine BID OU -- no significant improvement  - stopped PF and Prolensa  - recommend IVA OD #3 today, 12.06.22          - pt wishes to proceed with injection  - RBA of procedure discussed, questions answered - IVA informed consent obtained and signed, 10.11.22 (OD) - see procedure note - f/u 4 weeks, sooner prn -- DFE/OCT, possible injection  5. 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  - discussed diagnosis and progression  - not yet visually significant  - monitor for now  6. 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 Ocular hypertension OU  - IOP 17,18 today  - started on Latanoprost by Dr. Parke Simmers  - edema OD started about the  same time per daughter  - switched from [place] to brim due to possible macular edema from latan  - continue Cosopt BID OU   Ophthalmic Meds Ordered this visit:  Meds ordered this encounter  Medications   Bevacizumab (AVASTIN) SOLN 1.25 mg     Return in about 4 weeks (around 06/07/2021) for f/u HTN ret OD, DFE, OCT.  There are no Patient Instructions on file for this visit.  Explained the diagnoses, plan, and follow up with the patient and they expressed understanding.  Patient expressed understanding of the importance of proper follow up care.   This document serves as a record of services personally performed by Gardiner Sleeper, MD, PhD. It was created on their behalf by Estill Bakes, COT an ophthalmic technician. The creation of this record is the provider's dictation and/or activities during the visit.    Electronically signed by: Estill Bakes, COT 12.1.22 @ 8:55 AM   This document serves as a record of services personally performed by Gardiner Sleeper, MD, PhD. It was created on their behalf by San Jetty. Owens Shark, OA an ophthalmic technician. The creation of this record is the provider's dictation and/or activities during the visit.    Electronically signed by: San Jetty. Owens Shark, [place] 12.06.2022 8:55 AM   Gardiner Sleeper, M.D., Ph.D. Diseases & Surgery of the Retina and Vitreous [facility]  I have reviewed the above documentation for accuracy and completeness, and I agree with the above. Gardiner Sleeper, M.D., Ph.D. 05/10/21 8:56 AM   Abbreviations: M myopia (nearsighted); A astigmatism; H hyperopia (farsighted); P presbyopia; Mrx spectacle prescription;  CTL contact lenses; OD right eye; OS left eye; OU both eyes  XT exotropia; ET esotropia; PEK punctate epithelial keratitis; PEE punctate epithelial erosions; DES dry eye syndrome; MGD meibomian gland dysfunction; ATs artificial tears; PFAT's preservative free artificial tears; [place] nuclear sclerotic cataract; PSC  posterior subcapsular cataract; ERM epi-retinal membrane; PVD posterior vitreous detachment; RD retinal detachment; DM diabetes mellitus; DR diabetic retinopathy; NPDR non-proliferative diabetic retinopathy; PDR proliferative diabetic retinopathy; CSME clinically significant macular edema; DME diabetic macular edema; dbh dot blot hemorrhages; CWS cotton wool spot; POAG primary open angle glaucoma; C/D cup-to-disc ratio; HVF humphrey visual field; GVF goldmann visual field; OCT optical coherence tomography; IOP intraocular pressure; BRVO Branch retinal vein occlusion; CRVO central retinal vein occlusion; CRAO central retinal artery occlusion; BRAO branch retinal artery occlusion; RT retinal tear; SB scleral buckle; PPV pars plana vitrectomy; VH Vitreous hemorrhage; PRP panretinal laser photocoagulation; IVK intravitreal kenalog; VMT vitreomacular traction; MH Macular hole;  NVD neovascularization of the disc; NVE neovascularization elsewhere; AREDS age related eye disease study; ARMD age related macular degeneration; POAG primary open angle glaucoma; EBMD epithelial/anterior basement membrane dystrophy; ACIOL anterior chamber intraocular lens; IOL intraocular lens; PCIOL posterior chamber intraocular lens; Phaco/IOL phacoemulsification with intraocular lens placement; [place] photorefractive keratectomy; LASIK laser assisted in situ keratomileusis; HTN hypertension; DM diabetes mellitus; COPD chronic obstructive pulmonary disease

## 2021-05-10 ENCOUNTER — Other Ambulatory Visit: Payer: Self-pay

## 2021-05-10 ENCOUNTER — Ambulatory Visit (INDEPENDENT_AMBULATORY_CARE_PROVIDER_SITE_OTHER): Payer: Medicare Other | Admitting: Ophthalmology

## 2021-05-10 ENCOUNTER — Encounter (INDEPENDENT_AMBULATORY_CARE_PROVIDER_SITE_OTHER): Payer: Self-pay | Admitting: Ophthalmology

## 2021-05-10 DIAGNOSIS — H268 Other specified cataract: Secondary | ICD-10-CM | POA: Diagnosis not present

## 2021-05-10 DIAGNOSIS — H25813 Combined forms of age-related cataract, bilateral: Secondary | ICD-10-CM | POA: Diagnosis not present

## 2021-05-10 DIAGNOSIS — H34831 Tributary (branch) retinal vein occlusion, right eye, with macular edema: Secondary | ICD-10-CM | POA: Diagnosis not present

## 2021-05-10 DIAGNOSIS — H40053 Ocular hypertension, bilateral: Secondary | ICD-10-CM | POA: Diagnosis not present

## 2021-05-10 DIAGNOSIS — H35033 Hypertensive retinopathy, bilateral: Secondary | ICD-10-CM

## 2021-05-10 DIAGNOSIS — I10 Essential (primary) hypertension: Secondary | ICD-10-CM

## 2021-05-10 DIAGNOSIS — H3563 Retinal hemorrhage, bilateral: Secondary | ICD-10-CM | POA: Diagnosis not present

## 2021-05-10 DIAGNOSIS — H3581 Retinal edema: Secondary | ICD-10-CM

## 2021-05-10 MED ORDER — BEVACIZUMAB CHEMO INJECTION 1.25MG/0.05ML SYRINGE FOR KALEIDOSCOPE
1.2500 mg | INTRAVITREAL | Status: AC | PRN
  Administered 2021-05-10: 1.25 mg via INTRAVITREAL

## 2021-06-03 NOTE — Progress Notes (Shared)
[facility] - Clinic Note  06/07/2021     CHIEF COMPLAINT Patient presents for No chief complaint on file.  HISTORY OF PRESENT ILLNESS: Billy Harrell is a 70 y.o. male who presents to the clinic today for:    Referring physician: Daisy Floro, MD [address] [place],  [place] [address]  HISTORICAL INFORMATION:   Selected notes from the MEDICAL RECORD NUMBER Referred by Dr. Krista Blue for retina eval   CURRENT MEDICATIONS: Current Outpatient Medications (Ophthalmic Drugs)  Medication Sig   brimonidine (ALPHAGAN) 0.2 % ophthalmic solution Place 1 drop into both eyes 2 (two) times daily. (Patient not taking: Reported on 05/10/2021)   dorzolamide-timolol (COSOPT) 22.3-6.8 MG/ML ophthalmic solution 1 drop 2 (two) times daily.   No current facility-administered medications for this visit. (Ophthalmic Drugs)   Current Outpatient Medications (Other)  Medication Sig   acetaminophen (TYLENOL) 500 MG tablet Take 500 mg by mouth as needed for mild pain.    amLODipine (NORVASC) 5 MG tablet Take 1 tablet by mouth once daily   apixaban (ELIQUIS) 5 MG TABS tablet Take 1 tablet by mouth twice daily.  Keep scheduled follow-up with Dr Tenny Craw for FUTURE refills.  Will need Labwork repeated as well.   carvedilol (COREG) 6.25 MG tablet Take 1 tablet by mouth twice daily   fluticasone (FLONASE) 50 MCG/ACT nasal spray 1 spray in each nostril   Multiple Vitamin (MULTI-VITAMIN PO) Take 1 tablet by mouth daily.   Omega-3 Fatty Acids (FISH OIL) 1000 MG CAPS Take 1,000 mg by mouth as needed (for added supplement).    tamsulosin (FLOMAX) 0.4 MG CAPS capsule Take 0.4 mg by mouth daily.   No current facility-administered medications for this visit. (Other)   REVIEW OF SYSTEMS:   ALLERGIES No Known Allergies  PAST MEDICAL HISTORY Past Medical History:  Diagnosis Date   Cataract    Mixed form OU   Chronic kidney disease    ACE inhibitor and HCTZ DCd in past due to rising  Creatinine   Hypertension    [place] 6/19:  neg for RA stenosis bilat; normal mesenteric arteries   Hypertensive retinopathy    OU   Permanent atrial fibrillation    s/p DCCV 04/2017 >> failed // CHADS2-VASc=2 >> Apixaban Rx   Past Surgical History:  Procedure Laterality Date   CARDIOVERSION N/A 04/30/2017   Procedure: CARDIOVERSION;  Surgeon: Wendall Stade, MD;  Location: [facility] ENDOSCOPY;  Service: Cardiovascular;  Laterality: N/A;    FAMILY HISTORY No family history on file.  SOCIAL HISTORY Social History   Tobacco Use   Smoking status: Never   Smokeless tobacco: Never  Substance Use Topics   Alcohol use: No   Drug use: No       OPHTHALMIC EXAM:  Not recorded     IMAGING AND PROCEDURES  Imaging and Procedures for @TODAY @          ASSESSMENT/PLAN:  No diagnosis found.  1-4. Hypertensive retinopathy with retinal hemorrhage OU and retinal edema OD  - IVA OD #1 (10.11.22), #2 (11.08.22), #3 (12.6.22)  - original OCT showed focal IRF/IRHM OD; OS with mild IRHM, no edema  - FA (01.14.21) shows focal, parafoveal hyperfluorescent leakage OU (OD >> OS)  - repeat FA 1.11.22 showed mild improvement in perifoveal leakage OD  - BP at initial visit 140s / 99 and 96 in R and L arms respectively  - suspect hemorrhage and edema related to HTN and use of eliquis for A fib  -  differential includes mild, old BRVO w/ CME OD -- very similar in appearance to DME, but pt does not carry diagnosis of DM  - BP meds adjusted and BP improved  - exam and OCT today shows Interval improvement in IRF/IRHM temporal fovea, and BCVA OD stable at +20/25   - pt's daughter reports development of macular edema around same time pt started latanoprost  - ?PGA-induced edema  - stopped latanoprost and started brimonidine BID OU -- no significant improvement  - stopped PF and Prolensa  - recommend IVA OD #4 today, 1.3.23          - pt wishes to proceed with injection  - RBA of procedure discussed,  questions answered - IVA informed consent obtained and signed, 10.11.22 (OD) - see procedure note - f/u 4 weeks, sooner prn -- DFE/OCT, possible injection  5. 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 Ocular hypertension OU  - IOP 17,18 today  - started on Latanoprost by Dr. Krista Blue  - edema OD started about the same time per daughter  - switched from [place] to brim due to possible macular edema from latan  - continue Cosopt BID OU   Ophthalmic Meds Ordered this visit:  No orders of the defined types were placed in this encounter.    No follow-ups on file.  There are no Patient Instructions on file for this visit.  Explained the diagnoses, plan, and follow up with the patient and they expressed understanding.  Patient expressed understanding of the importance of proper follow up care.   This document serves as a record of services personally performed by Karie Chimera, MD, PhD. It was created on their behalf by Cristopher Estimable, COT an ophthalmic technician. The creation of this record is the provider's dictation and/or activities during the visit.    Electronically signed by: Cristopher Estimable, COT 12.30.22 @ 12:24 PM   Karie Chimera, M.D., Ph.D. Diseases & Surgery of the Retina and Vitreous [facility]   Abbreviations: M myopia (nearsighted); A astigmatism; H hyperopia (farsighted); P presbyopia; Mrx spectacle prescription;  CTL contact lenses; OD right eye; OS left eye; OU both eyes  XT exotropia; ET esotropia; PEK punctate epithelial keratitis; PEE punctate epithelial erosions; DES dry eye syndrome; MGD meibomian gland dysfunction; ATs  artificial tears; PFAT's preservative free artificial tears; NSC nuclear sclerotic cataract; PSC posterior subcapsular cataract; ERM epi-retinal membrane; PVD posterior vitreous detachment; RD retinal detachment; DM diabetes mellitus; DR diabetic retinopathy; NPDR non-proliferative diabetic retinopathy; PDR proliferative diabetic retinopathy; CSME clinically significant macular edema; DME diabetic macular edema; dbh dot blot hemorrhages; CWS cotton wool spot; POAG primary open angle glaucoma; C/D cup-to-disc ratio; HVF humphrey visual field; GVF goldmann visual field; OCT optical coherence tomography; IOP intraocular pressure; BRVO Branch retinal vein occlusion; CRVO central retinal vein occlusion; CRAO central retinal artery occlusion; BRAO branch retinal artery occlusion; RT retinal tear; SB scleral buckle; PPV pars plana vitrectomy; VH Vitreous hemorrhage; PRP panretinal laser photocoagulation; IVK intravitreal kenalog; VMT vitreomacular traction; MH Macular hole;  NVD neovascularization of the disc; NVE neovascularization elsewhere; AREDS age related eye disease study; ARMD age related macular degeneration; POAG primary open angle glaucoma;  EBMD epithelial/anterior basement membrane dystrophy; ACIOL anterior chamber intraocular lens; IOL intraocular lens; PCIOL posterior chamber intraocular lens; Phaco/IOL phacoemulsification with intraocular lens placement; PRK photorefractive keratectomy; LASIK laser assisted in situ keratomileusis; HTN hypertension; DM diabetes mellitus; COPD chronic obstructive pulmonary disease

## 2021-06-07 ENCOUNTER — Encounter (INDEPENDENT_AMBULATORY_CARE_PROVIDER_SITE_OTHER): Payer: Self-pay | Admitting: Ophthalmology

## 2021-06-07 ENCOUNTER — Other Ambulatory Visit: Payer: Self-pay

## 2021-06-07 ENCOUNTER — Encounter (INDEPENDENT_AMBULATORY_CARE_PROVIDER_SITE_OTHER): Payer: Medicare Other | Admitting: Ophthalmology

## 2021-06-07 ENCOUNTER — Ambulatory Visit (INDEPENDENT_AMBULATORY_CARE_PROVIDER_SITE_OTHER): Payer: Medicare Other | Admitting: Ophthalmology

## 2021-06-07 DIAGNOSIS — H25813 Combined forms of age-related cataract, bilateral: Secondary | ICD-10-CM

## 2021-06-07 DIAGNOSIS — G4709 Other insomnia: Secondary | ICD-10-CM | POA: Diagnosis not present

## 2021-06-07 DIAGNOSIS — H35033 Hypertensive retinopathy, bilateral: Secondary | ICD-10-CM

## 2021-06-07 DIAGNOSIS — H3563 Retinal hemorrhage, bilateral: Secondary | ICD-10-CM | POA: Diagnosis not present

## 2021-06-07 DIAGNOSIS — H40053 Ocular hypertension, bilateral: Secondary | ICD-10-CM | POA: Diagnosis not present

## 2021-06-07 DIAGNOSIS — H268 Other specified cataract: Secondary | ICD-10-CM

## 2021-06-07 DIAGNOSIS — Z0000 Encounter for general adult medical examination without abnormal findings: Secondary | ICD-10-CM | POA: Diagnosis not present

## 2021-06-07 DIAGNOSIS — H34831 Tributary (branch) retinal vein occlusion, right eye, with macular edema: Secondary | ICD-10-CM

## 2021-06-07 DIAGNOSIS — I10 Essential (primary) hypertension: Secondary | ICD-10-CM

## 2021-06-07 MED ORDER — BEVACIZUMAB CHEMO INJECTION 1.25MG/0.05ML SYRINGE FOR KALEIDOSCOPE
1.2500 mg | INTRAVITREAL | Status: AC | PRN
  Administered 2021-06-07: 1.25 mg via INTRAVITREAL

## 2021-06-07 NOTE — Progress Notes (Signed)
[facility] Note  06/07/2021     CHIEF COMPLAINT Patient presents for Retina Follow Up  HISTORY OF PRESENT ILLNESS: Billy Harrell is a 71 y.o. male who presents to the clinic today for:   HPI     Retina Follow Up   Patient presents with  Other.  In both eyes.  Duration of 4 weeks.  Since onset it is stable.  I, the attending physician,  performed the HPI with the patient and updated documentation appropriately.        Comments   4 week follow up HTN Retinopathy OU- Patient thinks eyes are doing better.  Patient is only using Cosopt BID OU currently.  He started seeing Dr. Lucianne Lei (has only seen her once).  She is ok with him on Cosopt only for now.       Last edited by Bernarda Caffey, MD on 06/07/2021  1:30 PM.    Pt states vision is doing okay   Referring physician: Lawerance Cruel, MD [place],  [place] [address]  HISTORICAL INFORMATION:   Selected notes from the MEDICAL RECORD NUMBER Referred by Dr. Parke Simmers for retina eval   CURRENT MEDICATIONS: Current Outpatient Medications (Ophthalmic Drugs)  Medication Sig   dorzolamide-timolol (COSOPT) 22.3-6.8 MG/ML ophthalmic solution 1 drop 2 (two) times daily.   brimonidine (ALPHAGAN) 0.2 % ophthalmic solution Place 1 drop into both eyes 2 (two) times daily. (Patient not taking: Reported on 05/10/2021)   No current facility-administered medications for this visit. (Ophthalmic Drugs)   Current Outpatient Medications (Other)  Medication Sig   acetaminophen (TYLENOL) 500 MG tablet Take 500 mg by mouth as needed for mild pain.    amLODipine (NORVASC) 5 MG tablet Take 1 tablet by mouth once daily   apixaban (ELIQUIS) 5 MG TABS tablet Take 1 tablet by mouth twice daily.  Keep scheduled follow-up with Dr Harrington Challenger for FUTURE refills.  Will need Labwork repeated as well.   carvedilol (COREG) 6.25 MG tablet Take 1 tablet by mouth twice daily   fluticasone (FLONASE) 50 MCG/ACT nasal spray 1 spray  in each nostril   Multiple Vitamin (MULTI-VITAMIN PO) Take 1 tablet by mouth daily.   Omega-3 Fatty Acids (FISH OIL) 1000 MG CAPS Take 1,000 mg by mouth as needed (for added supplement).    tamsulosin (FLOMAX) 0.4 MG CAPS capsule Take 0.4 mg by mouth daily.   No current facility-administered medications for this visit. (Other)   REVIEW OF SYSTEMS: ROS   Positive for: Endocrine, Cardiovascular, Eyes Negative for: Constitutional, Gastrointestinal, Neurological, Skin, Genitourinary, Musculoskeletal, HENT, Respiratory, Psychiatric, Allergic/Imm, Heme/Lymph Last edited by Leonie Douglas, COA on 06/07/2021  1:08 PM.     ALLERGIES No Known Allergies  PAST MEDICAL HISTORY Past Medical History:  Diagnosis Date   Cataract    Mixed form OU   Chronic kidney disease    ACE inhibitor and HCTZ DCd in past due to rising Creatinine   Hypertension    [place] 6/19:  neg for RA stenosis bilat; normal mesenteric arteries   Hypertensive retinopathy    OU   Permanent atrial fibrillation    s/p DCCV 04/2017 >> failed // CHADS2-VASc=2 >> Apixaban Rx   Past Surgical History:  Procedure Laterality Date   CARDIOVERSION N/A 04/30/2017   Procedure: CARDIOVERSION;  Surgeon: Josue Hector, MD;  Location: [facility];  Service: Cardiovascular;  Laterality: N/A;    FAMILY HISTORY History reviewed. No pertinent family history.  SOCIAL HISTORY Social History  Tobacco Use   Smoking status: Never   Smokeless tobacco: Never  Substance Use Topics   Alcohol use: No   Drug use: No       OPHTHALMIC EXAM:  Base Eye Exam     Visual Acuity (Snellen - Linear)       Right Left   Dist [place] 20/25 20/20-   Dist ph [place] 20/25 +2          Tonometry (Tonopen, 1:14 PM)       Right Left   Pressure 21 20         Pupils       Dark Light Shape React APD   Right 3 2 Round Minimal None   Left 3 2 Round Minimal None         Visual Fields (Counting fingers)       Left Right    Full Full          Extraocular Movement       Right Left    Full Full         Neuro/Psych     Oriented x3: Yes   Mood/Affect: Normal         Dilation     Both eyes: 1.0% Mydriacyl, 2.5% Phenylephrine @ 1:14 PM           Slit Lamp and Fundus Exam     Slit Lamp Exam       Right Left   Lids/Lashes Dermatochalasis - upper lid, Meibomian gland dysfunction Dermatochalasis - upper lid, Meibomian gland dysfunction   Conjunctiva/Sclera Mild Melanosis Mild Melanosis   Cornea 1-2+Punctate epithelial erosions, mild Debris in tear film Trace tear film debris, 1+ Punctate epithelial erosions   Anterior Chamber Deep, 1+ pigment Deep and quiet   Iris Round and moderately dilated, pigment clump at 0800 midzone Round and moderately dilated   Lens 2-3+ Nuclear sclerosis, 2-3+ Cortical cataract, Pseudoexfoliative material on anterior capsule 2+ Nuclear sclerosis, 2-3+ Cortical cataract, early pseudoexfoliation    Anterior Vitreous Mild Vitreous syneresis, Posterior vitreous detachment Mild Vitreous syneresis, vitreous condensations         Fundus Exam       Right Left   Disc Pink and Sharp, +cupping Pink and Sharp, +cupping   C/D Ratio 0.7 0.6   Macula Flat, Blunted foveal reflex, central IRH - improved, +edema -- persistent, RPE mottling and clumping, no discreet targets for focal laser Flat, good foveal reflex, mild RPE mottling, rare MA, no edema   Vessels attenuated, mild tortuousity attenuated, tortuousity, mild Copper wiring, AV crossing changes   Periphery Attached, No heme  Attached, No heme             IMAGING AND PROCEDURES  Imaging and Procedures for $RemoveBefore'@TODAY'[email or web address]$ @  OCT, Retina - OU - Both Eyes       Right Eye Quality was good. Central Foveal Thickness: 308. Progression has worsened. Findings include intraretinal fluid, no SRF, abnormal foveal contour (Persistent IRF/cystic changes temporal fovea -- slightly increased).   Left Eye Quality was good. Central Foveal Thickness: 262.  Progression has been stable. Findings include normal foveal contour, no IRF, no SRF (Trace focal IRHM ).   Notes *Images captured and stored on drive  Diagnosis / Impression:  OD: Persistent IRF/cystic changes temporal fovea -- slightly increased OS: NFP, no IRF/SRF; tr focal IRHM  Clinical management:  See below  Abbreviations: NFP - Normal foveal profile. CME - cystoid macular edema. PED - pigment epithelial detachment. IRF -  intraretinal fluid. SRF - subretinal fluid. EZ - ellipsoid zone. ERM - epiretinal membrane. ORA - outer retinal atrophy. ORT - outer retinal tubulation. SRHM - subretinal hyper-reflective material      Intravitreal Injection, Pharmacologic Agent - OD - Right Eye       Time Out 06/07/2021. 1:17 PM. Confirmed correct patient, procedure, site, and patient consented.   Anesthesia Topical anesthesia was used. Anesthetic medications included Lidocaine 2%, Proparacaine 0.5%.   Procedure Preparation included 5% betadine to ocular surface, eyelid speculum. A (32g) needle was used.   Injection: 1.25 mg Bevacizumab 1.25mg /0.50ml   Route: Intravitreal, Site: Right Eye   NDC: H061816, Lot: [id number] , Expiration date: 07/12/2021, Waste: 0 mL   Post-op Post injection exam found visual acuity of at least counting fingers. The patient tolerated the procedure well. There were no complications. The patient received written and verbal post procedure care education. Post injection medications were not given.            ASSESSMENT/PLAN:   ICD-10-CM   1. Hypertensive retinopathy of both eyes  H35.033     2. Essential hypertension  I10     3. Retinal hemorrhage of both eyes  H35.63     4. Branch retinal vein occlusion of right eye with macular edema  H34.8310 OCT, Retina - OU - Both Eyes    Intravitreal Injection, Pharmacologic Agent - OD - Right Eye    Bevacizumab (AVASTIN) SOLN 1.25 mg    5. Combined forms of age-related cataract of both eyes  H25.813      6. 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 Hypertensive retinopathy with retinal hemorrhage OU and retinal edema OD  - IVA OD #1 (10.11.22), #2 (11.08.22), #4 (12.06.22)  - original OCT showed focal IRF/IRHM OD; OS with mild IRHM, no edema  - FA (01.14.21) shows focal, parafoveal hyperfluorescent leakage OU (OD >> OS)  - repeat FA 1.11.22 showed mild improvement in perifoveal leakage OD  - BP at initial visit 140s / 99 and 96 in R and L arms respectively  - suspect hemorrhage and edema related to HTN and use of eliquis for A fib  - differential includes mild, old BRVO w/ CME OD -- very similar in appearance to DME, but pt does not carry diagnosis of DM  - BP meds adjusted and BP improved  - exam and OCT today shows persistent IRF/cystic changes temporal fovea -- slightly increased, and BCVA OD stable at +20/25   - discussed possibility of IVA resistance  - pt's daughter reports development of macular edema around same time pt started latanoprost  - ?PGA-induced edema  - stopped latanoprost and started brimonidine BID OU -- no significant improvement in edema  - stopped PF and Prolensa  - recommend IVA OD #4 today, 01.03.23          - pt wishes to proceed with injection  - RBA of procedure discussed, questions answered - IVA informed consent obtained and signed, 10.11.22 (OD) - see procedure note - consider switching to Eylea if no further improvement in edema at next appt - f/u 4 weeks, sooner prn -- DFE/OCT, possible injection  5. 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  - discussed diagnosis and progression  - not yet visually significant  - monitor for now  6. 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 Ocular hypertension OU  -  IOP 21,20 today  - started on Latanoprost by Dr. Parke Simmers  - edema OD started about the same time per daughter  - switched from [place] to brim due to possible macular edema from latan  - continue brim BID OU   Ophthalmic Meds Ordered this visit:  Meds ordered this encounter  Medications   Bevacizumab (AVASTIN) SOLN 1.25 mg     Return in about 4 weeks (around 07/05/2021) for f/u HTN ret OD, DFE, OCT.  There are no Patient Instructions on file for this visit.  Explained the diagnoses, plan, and follow up with the patient and they expressed understanding.  Patient expressed understanding of the importance of proper follow up care.   This document serves as a record of services personally performed by Gardiner Sleeper, MD, PhD. It was created on their behalf by San Jetty. Owens Shark, OA an ophthalmic technician. The creation of this record is the provider's dictation and/or activities during the visit.    Electronically signed by: San Jetty. Marguerita Merles 01.03.2023 4:39 PM  Gardiner Sleeper, M.D., Ph.D. Diseases & Surgery of the Retina and Vitreous [facility]  I have reviewed the above documentation for accuracy and completeness, and I agree with the above. Gardiner Sleeper, M.D., Ph.D. 06/07/21 4:39 PM   Abbreviations: M myopia (nearsighted); A astigmatism; H hyperopia (farsighted); P presbyopia; Mrx spectacle prescription;  CTL contact lenses; OD right eye; OS left eye; OU both eyes  XT exotropia; ET esotropia; PEK punctate epithelial keratitis; PEE punctate epithelial erosions; DES dry eye syndrome; MGD meibomian gland dysfunction; ATs artificial tears; PFAT's preservative free artificial tears; [place] nuclear sclerotic cataract; PSC posterior subcapsular cataract; ERM epi-retinal membrane; PVD posterior vitreous detachment; RD retinal detachment; DM diabetes mellitus; DR diabetic retinopathy; NPDR non-proliferative diabetic retinopathy; PDR proliferative diabetic retinopathy; CSME  clinically significant macular edema; DME diabetic macular edema; dbh dot blot hemorrhages; CWS cotton wool spot; POAG primary open angle glaucoma; C/D cup-to-disc ratio; HVF humphrey visual field; GVF goldmann visual field; OCT optical coherence tomography; IOP intraocular pressure; BRVO Branch retinal vein occlusion; CRVO central retinal vein occlusion; CRAO central retinal artery occlusion; BRAO branch retinal artery occlusion; RT retinal tear; SB scleral buckle; PPV pars plana vitrectomy; VH Vitreous hemorrhage; PRP panretinal laser photocoagulation; IVK intravitreal kenalog; VMT vitreomacular traction; MH Macular hole;  NVD neovascularization of the disc; NVE neovascularization elsewhere; AREDS age related eye disease study; ARMD age related macular degeneration; POAG primary open angle glaucoma; EBMD epithelial/anterior basement membrane dystrophy; ACIOL anterior chamber intraocular lens; IOL intraocular lens; PCIOL posterior chamber intraocular lens; Phaco/IOL phacoemulsification with intraocular lens placement; [place] photorefractive keratectomy; LASIK laser assisted in situ keratomileusis; HTN hypertension; DM diabetes mellitus; COPD chronic obstructive pulmonary disease

## 2021-07-04 ENCOUNTER — Other Ambulatory Visit: Payer: Self-pay | Admitting: Internal Medicine

## 2021-07-04 NOTE — Progress Notes (Signed)
[facility] Note  07/05/2021     CHIEF COMPLAINT Patient presents for Retina Follow Up  HISTORY OF PRESENT ILLNESS: Billy Harrell is a 71 y.o. male who presents to the clinic today for:   HPI     Retina Follow Up   Patient presents with  Other.  In both eyes.  Duration of 4 weeks.  Since onset it is stable.  I, the attending physician,  performed the HPI with the patient and updated documentation appropriately.        Comments   4 week follow up HTN Retinopathy OU-  Doing well, no changes in vision. Using Cosopt BID OU.      Last edited by Bernarda Caffey, MD on 07/06/2021  5:38 PM.       Referring physician: Lawerance Cruel, MD [place],  [place] [id number]  HISTORICAL INFORMATION:   Selected notes from the MEDICAL RECORD NUMBER Referred by Dr. Parke Simmers for retina eval   CURRENT MEDICATIONS: Current Outpatient Medications (Ophthalmic Drugs)  Medication Sig   dorzolamide-timolol (COSOPT) 22.3-6.8 MG/ML ophthalmic solution 1 drop 2 (two) times daily.   brimonidine (ALPHAGAN) 0.2 % ophthalmic solution Place 1 drop into both eyes 2 (two) times daily. (Patient not taking: Reported on 05/10/2021)   No current facility-administered medications for this visit. (Ophthalmic Drugs)   Current Outpatient Medications (Other)  Medication Sig   acetaminophen (TYLENOL) 500 MG tablet Take 500 mg by mouth as needed for mild pain.    apixaban (ELIQUIS) 5 MG TABS tablet Take 1 tablet by mouth twice daily.  Keep scheduled follow-up with Dr Harrington Challenger for FUTURE refills.  Will need Labwork repeated as well.   carvedilol (COREG) 6.25 MG tablet Take 1 tablet by mouth twice daily   fluticasone (FLONASE) 50 MCG/ACT nasal spray 1 spray in each nostril   Multiple Vitamin (MULTI-VITAMIN PO) Take 1 tablet by mouth daily.   Omega-3 Fatty Acids (FISH OIL) 1000 MG CAPS Take 1,000 mg by mouth as needed (for added supplement).    tamsulosin (FLOMAX) 0.4 MG CAPS  capsule Take 0.4 mg by mouth daily.   amLODipine (NORVASC) 5 MG tablet Take 1 tablet by mouth once daily   No current facility-administered medications for this visit. (Other)   REVIEW OF SYSTEMS: ROS   Positive for: Genitourinary, Endocrine, Cardiovascular, Eyes Negative for: Constitutional, Gastrointestinal, Neurological, Skin, Musculoskeletal, HENT, Respiratory, Psychiatric, Allergic/Imm, Heme/Lymph Last edited by Leonie Douglas, COA on 07/05/2021  7:58 AM.      ALLERGIES No Known Allergies  PAST MEDICAL HISTORY Past Medical History:  Diagnosis Date   Cataract    Mixed form OU   Chronic kidney disease    ACE inhibitor and HCTZ DCd in past due to rising Creatinine   Hypertension    [place] 6/19:  neg for RA stenosis bilat; normal mesenteric arteries   Hypertensive retinopathy    OU   Permanent atrial fibrillation    s/p DCCV 04/2017 >> failed // CHADS2-VASc=2 >> Apixaban Rx   Past Surgical History:  Procedure Laterality Date   CARDIOVERSION N/A 04/30/2017   Procedure: CARDIOVERSION;  Surgeon: Josue Hector, MD;  Location: [place];  Service: Cardiovascular;  Laterality: N/A;    FAMILY HISTORY History reviewed. No pertinent family history.  SOCIAL HISTORY Social History   Tobacco Use   Smoking status: Never   Smokeless tobacco: Never  Substance Use Topics   Alcohol use: No   Drug use: No  OPHTHALMIC EXAM:  Base Eye Exam     Visual Acuity (Snellen - Linear)       Right Left   Dist cc 20/25 20/20   Dist ph cc NI          Tonometry (Tonopen, 8:03 AM)       Right Left   Pressure 21 19         Pupils       Dark Light Shape React APD   Right 3 2 Round Minimal None   Left 3 2 Round Minimal None         Visual Fields (Counting fingers)       Left Right    Full Full         Extraocular Movement       Right Left    Full Full         Neuro/Psych     Oriented x3: Yes   Mood/Affect: Normal         Dilation     Both  eyes: 1.0% Mydriacyl, 2.5% Phenylephrine @ 8:03 AM           Slit Lamp and Fundus Exam     Slit Lamp Exam       Right Left   Lids/Lashes Dermatochalasis - upper lid, Meibomian gland dysfunction Dermatochalasis - upper lid, Meibomian gland dysfunction   Conjunctiva/Sclera Mild Melanosis Mild Melanosis   Cornea 1-2+Punctate epithelial erosions, mild Debris in tear film Trace tear film debris, 1+ Punctate epithelial erosions   Anterior Chamber Deep, 1+ pigment Deep and quiet   Iris Round and moderately dilated, pigment clump at 0800 midzone Round and moderately dilated   Lens 2-3+ Nuclear sclerosis, 2-3+ Cortical cataract, Pseudoexfoliative material on anterior capsule 2+ Nuclear sclerosis, 2-3+ Cortical cataract, early pseudoexfoliation    Anterior Vitreous Mild Vitreous syneresis, Posterior vitreous detachment Mild Vitreous syneresis, vitreous condensations         Fundus Exam       Right Left   Disc Pink and Sharp, +cupping Pink and Sharp, +cupping   C/D Ratio 0.7 0.6   Macula Flat, Blunted foveal reflex, central IRH - improved, +edema -- increased, RPE mottling and clumping, no discreet targets for focal laser Flat, good foveal reflex, mild RPE mottling, rare MA, no edema   Vessels attenuated, mild tortuousity attenuated, tortuousity, mild Copper wiring, AV crossing changes   Periphery Attached, No heme  Attached, No heme             IMAGING AND PROCEDURES  Imaging and Procedures for _0 @  OCT, Retina - OU - Both Eyes       Right Eye Quality was good. Central Foveal Thickness: 309. Progression has worsened. Findings include intraretinal fluid, no SRF, abnormal foveal contour, intraretinal hyper-reflective material (Mild interval improvement in central cyst, mild interval increase in temporal IRF).   Left Eye Quality was good. Central Foveal Thickness: 261. Progression has been stable. Findings include normal foveal contour, no IRF, no SRF (Trace focal IRHM ).    Notes *Images captured and stored on drive  Diagnosis / Impression:  OD: Mild interval improvement in central cyst, mild interval increase in temporal IRF OS: NFP, no IRF/SRF; tr focal IRHM  Clinical management:  See below  Abbreviations: NFP - Normal foveal profile. CME - cystoid macular edema. PED - pigment epithelial detachment. IRF - intraretinal fluid. SRF - subretinal fluid. EZ - ellipsoid zone. ERM - epiretinal membrane. ORA - outer retinal atrophy. ORT - outer  retinal tubulation. SRHM - subretinal hyper-reflective material      Intravitreal Injection, Pharmacologic Agent - OD - Right Eye       Time Out 07/05/2021. 8:27 AM. Confirmed correct patient, procedure, site, and patient consented.   Anesthesia Topical anesthesia was used. Anesthetic medications included Lidocaine 2%, Proparacaine 0.5%.   Procedure Preparation included 5% betadine to ocular surface, eyelid speculum. A (32g) needle was used.   Injection: 2 mg aflibercept 2 MG/0.05ML   Route: Intravitreal, Site: Right Eye   NDC: 30076-226-33, Lot: [id number], Expiration date: 07/06/2021, Waste: 0.05 mL   Post-op Post injection exam found visual acuity of at least counting fingers. The patient tolerated the procedure well. There were no complications. The patient received written and verbal post procedure care education. Post injection medications were not given.   Notes **SAMPLE MEDICATION ADMINISTERED**            ASSESSMENT/PLAN:   ICD-10-CM   1. Hypertensive retinopathy of both eyes  H35.033 OCT, Retina - OU - Both Eyes    2. Essential hypertension  I10     3. Retinal hemorrhage of both eyes  H35.63     4. Branch retinal vein occlusion of right eye with macular edema  H34.8310 OCT, Retina - OU - Both Eyes    Intravitreal Injection, Pharmacologic Agent - OD - Right Eye    aflibercept (EYLEA) SOLN 2 mg    5. Combined forms of age-related cataract of both eyes  H25.813     6. 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 Hypertensive retinopathy with retinal hemorrhage OU and retinal edema OD  - IVA OD #1 (10.11.22), #2 (11.08.22), #3 (12.06.22), #4 (1.3.23)  - original OCT showed focal IRF/IRHM OD; OS with mild IRHM, no edema  - FA (01.14.21) shows focal, parafoveal hyperfluorescent leakage OU (OD >> OS)  - repeat FA 1.11.22 showed mild improvement in perifoveal leakage OD  - BP at initial visit 140s / 99 and 96 in R and L arms respectively  - suspect hemorrhage and edema related to HTN and use of eliquis for A fib  - differential includes mild, old BRVO w/ CME OD -- very similar in appearance to DME, but pt does not carry diagnosis of DM  - BP meds adjusted and BP improved  - exam and OCT today shows persistent IRF/cystic changes temporal fovea -- slightly increased, and BCVA OD stable at 20/25   - discussed possibility of IVA resistance  - pt's daughter reports development of macular edema around same time pt started latanoprost  - ?PGA-induced edema  - stopped latanoprost and started brimonidine BID OU -- no significant improvement in edema  - stopped PF and Prolensa  - recommend IVE OD #1 (sample) today, 01.31.23          - pt wishes to proceed with injection  - RBA of procedure discussed, questions answered - IVA informed consent obtained and signed, 10.11.22 (OD) - IVE informed consent obtained and signed, 01.31.23 (OD) - see procedure note - f/u 4 weeks, sooner prn -- DFE/OCT, possible injection  5. 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  - discussed diagnosis and progression  - not yet visually significant  - monitor for now  6. 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 Ocular hypertension OU  - IOP 21,19 today  -  started on Latanoprost by Dr. Parke Simmers  - edema OD started about the same time per daughter  - switched from [place] to brim due to possible macular edema from latan  - continue brim BID OU   Ophthalmic Meds Ordered this visit:  Meds ordered this encounter  Medications   aflibercept (EYLEA) SOLN 2 mg     Return in about 4 weeks (around 08/02/2021) for f/u HTN ret OD, DFE, OCT.  There are no Patient Instructions on file for this visit.  Explained the diagnoses, plan, and follow up with the patient and they expressed understanding.  Patient expressed understanding of the importance of proper follow up care.   This document serves as a record of services personally performed by Gardiner Sleeper, MD, PhD. It was created on their behalf by Estill Bakes, COT an ophthalmic technician. The creation of this record is the provider's dictation and/or activities during the visit.    Electronically signed by: Estill Bakes, COT 1.30.23 @ 5:42 PM   This document serves as a record of services personally performed by Gardiner Sleeper, MD, PhD. It was created on their behalf by San Jetty. Owens Shark, OA an ophthalmic technician. The creation of this record is the provider's dictation and/or activities during the visit.    Electronically signed by: San Jetty. [place], [place] 01.31.2023 5:42 PM   Gardiner Sleeper, M.D., Ph.D. Diseases & Surgery of the Retina and Vitreous [facility]  I have reviewed the above documentation for accuracy and completeness, and I agree with the above. Gardiner Sleeper, M.D., Ph.D. 07/06/21 5:42 PM  Abbreviations: M myopia (nearsighted); A astigmatism; H hyperopia (farsighted); P presbyopia; Mrx spectacle prescription;  CTL contact lenses; OD right eye; OS left eye; OU both eyes  XT exotropia; ET esotropia; PEK punctate epithelial keratitis; PEE punctate epithelial erosions; DES dry eye syndrome; MGD meibomian gland dysfunction; ATs artificial tears; PFAT's preservative  free artificial tears; [place] nuclear sclerotic cataract; PSC posterior subcapsular cataract; ERM epi-retinal membrane; PVD posterior vitreous detachment; RD retinal detachment; DM diabetes mellitus; DR diabetic retinopathy; NPDR non-proliferative diabetic retinopathy; PDR proliferative diabetic retinopathy; CSME clinically significant macular edema; DME diabetic macular edema; dbh dot blot hemorrhages; CWS cotton wool spot; POAG primary open angle glaucoma; C/D cup-to-disc ratio; HVF humphrey visual field; GVF goldmann visual field; OCT optical coherence tomography; IOP intraocular pressure; BRVO Branch retinal vein occlusion; CRVO central retinal vein occlusion; CRAO central retinal artery occlusion; BRAO branch retinal artery occlusion; RT retinal tear; SB scleral buckle; PPV pars plana vitrectomy; VH Vitreous hemorrhage; PRP panretinal laser photocoagulation; IVK intravitreal kenalog; VMT vitreomacular traction; MH Macular hole;  NVD neovascularization of the disc; NVE neovascularization elsewhere; AREDS age related eye disease study; ARMD age related macular degeneration; POAG primary open angle glaucoma; EBMD epithelial/anterior basement membrane dystrophy; ACIOL anterior chamber intraocular lens; IOL intraocular lens; PCIOL posterior chamber intraocular lens; Phaco/IOL phacoemulsification with intraocular lens placement; [place] photorefractive keratectomy; LASIK laser assisted in situ keratomileusis; HTN hypertension; DM diabetes mellitus; COPD chronic obstructive pulmonary disease

## 2021-07-05 ENCOUNTER — Ambulatory Visit (INDEPENDENT_AMBULATORY_CARE_PROVIDER_SITE_OTHER): Payer: Medicare Other | Admitting: Ophthalmology

## 2021-07-05 ENCOUNTER — Other Ambulatory Visit: Payer: Self-pay

## 2021-07-05 ENCOUNTER — Encounter (INDEPENDENT_AMBULATORY_CARE_PROVIDER_SITE_OTHER): Payer: Self-pay | Admitting: Ophthalmology

## 2021-07-05 DIAGNOSIS — H268 Other specified cataract: Secondary | ICD-10-CM

## 2021-07-05 DIAGNOSIS — I10 Essential (primary) hypertension: Secondary | ICD-10-CM | POA: Diagnosis not present

## 2021-07-05 DIAGNOSIS — H35033 Hypertensive retinopathy, bilateral: Secondary | ICD-10-CM

## 2021-07-05 DIAGNOSIS — H40053 Ocular hypertension, bilateral: Secondary | ICD-10-CM

## 2021-07-05 DIAGNOSIS — H3563 Retinal hemorrhage, bilateral: Secondary | ICD-10-CM | POA: Diagnosis not present

## 2021-07-05 DIAGNOSIS — H34831 Tributary (branch) retinal vein occlusion, right eye, with macular edema: Secondary | ICD-10-CM | POA: Diagnosis not present

## 2021-07-05 DIAGNOSIS — H25813 Combined forms of age-related cataract, bilateral: Secondary | ICD-10-CM | POA: Diagnosis not present

## 2021-07-06 ENCOUNTER — Encounter (INDEPENDENT_AMBULATORY_CARE_PROVIDER_SITE_OTHER): Payer: Self-pay | Admitting: Ophthalmology

## 2021-07-06 MED ORDER — AFLIBERCEPT 2MG/0.05ML IZ SOLN FOR KALEIDOSCOPE
2.0000 mg | INTRAVITREAL | Status: AC | PRN
  Administered 2021-07-06: 2 mg via INTRAVITREAL

## 2021-07-28 NOTE — Progress Notes (Signed)
[facility] Note  08/03/2021     CHIEF COMPLAINT Patient presents for Retina Follow Up  HISTORY OF PRESENT ILLNESS: Billy Harrell is a 71 y.o. male who presents to the clinic today for:   HPI     Retina Follow Up   Patient presents with  Other.  In both eyes.  This started 4 weeks ago.  I, the attending physician,  performed the HPI with the patient and updated documentation appropriately.        Comments   Patient here for 4 weeks reitna follow up for HTN Ret OU. Patient states vision about the same.  no eye pain.      Last edited by Bernarda Caffey, MD on 08/03/2021  9:37 AM.    Pt states no change in vision, he feels like the floaters took a longer time to clear up after the first Eylea  Referring physician: Lawerance Cruel, MD [place],  [place] [phone]  HISTORICAL INFORMATION:   Selected notes from the [facility] Referred by Dr. Parke Simmers for retina eval   CURRENT MEDICATIONS: Current Outpatient Medications (Ophthalmic Drugs)  Medication Sig   dorzolamide-timolol (COSOPT) 22.3-6.8 MG/ML ophthalmic solution 1 drop 2 (two) times daily.   brimonidine (ALPHAGAN) 0.2 % ophthalmic solution Place 1 drop into both eyes 2 (two) times daily. (Patient not taking: Reported on 05/10/2021)   No current facility-administered medications for this visit. (Ophthalmic Drugs)   Current Outpatient Medications (Other)  Medication Sig   acetaminophen (TYLENOL) 500 MG tablet Take 500 mg by mouth as needed for mild pain.    amLODipine (NORVASC) 5 MG tablet Take 1 tablet by mouth once daily   apixaban (ELIQUIS) 5 MG TABS tablet Take 1 tablet by mouth twice daily.  Keep scheduled follow-up with Dr Harrington Challenger for FUTURE refills.  Will need Labwork repeated as well.   carvedilol (COREG) 6.25 MG tablet Take 1 tablet by mouth twice daily   fluticasone (FLONASE) 50 MCG/ACT nasal spray 1 spray in each nostril   Multiple Vitamin (MULTI-VITAMIN PO)  Take 1 tablet by mouth daily.   Omega-3 Fatty Acids (FISH OIL) 1000 MG CAPS Take 1,000 mg by mouth as needed (for added supplement).    tamsulosin (FLOMAX) 0.4 MG CAPS capsule Take 0.4 mg by mouth daily.   No current facility-administered medications for this visit. (Other)   REVIEW OF SYSTEMS: ROS   Positive for: Genitourinary, Endocrine, Cardiovascular, Eyes Negative for: Constitutional, Gastrointestinal, Neurological, Skin, Musculoskeletal, HENT, Respiratory, Psychiatric, Allergic/Imm, Heme/Lymph Last edited by Theodore Demark, COA on 08/03/2021  9:15 AM.     ALLERGIES No Known Allergies  PAST MEDICAL HISTORY Past Medical History:  Diagnosis Date   Cataract    Mixed form OU   Chronic kidney disease    ACE inhibitor and HCTZ DCd in past due to rising Creatinine   Hypertension    [place] 6/19:  neg for RA stenosis bilat; normal mesenteric arteries   Hypertensive retinopathy    OU   Permanent atrial fibrillation    s/p DCCV 04/2017 >> failed // CHADS2-VASc=2 >> Apixaban Rx   Past Surgical History:  Procedure Laterality Date   CARDIOVERSION N/A 04/30/2017   Procedure: CARDIOVERSION;  Surgeon: Josue Hector, MD;  Location: [facility];  Service: Cardiovascular;  Laterality: N/A;   FAMILY HISTORY History reviewed. No pertinent family history.  SOCIAL HISTORY Social History   Tobacco Use   Smoking status: Never   Smokeless  tobacco: Never  Vaping Use   Vaping Use: Never used  Substance Use Topics   Alcohol use: No   Drug use: No       OPHTHALMIC EXAM:  Base Eye Exam     Visual Acuity (Snellen - Linear)       Right Left   Dist [place] 20/25 -1 20/20 -1   Dist ph [place] NI          Tonometry (Tonopen, 9:12 AM)       Right Left   Pressure 18 16         Pupils       Dark Light Shape React APD   Right 3 2 Round Minimal None   Left 3 2 Round Minimal None         Visual Fields (Counting fingers)       Left Right    Full Full         Extraocular  Movement       Right Left    Full, Ortho Full, Ortho         Neuro/Psych     Oriented x3: Yes   Mood/Affect: Normal         Dilation     Both eyes: 1.0% Mydriacyl, 2.5% Phenylephrine @ 9:11 AM           Slit Lamp and Fundus Exam     Slit Lamp Exam       Right Left   Lids/Lashes Dermatochalasis - upper lid, Meibomian gland dysfunction Dermatochalasis - upper lid, Meibomian gland dysfunction   Conjunctiva/Sclera Mild Melanosis Mild Melanosis   Cornea 1-2+Punctate epithelial erosions, mild Debris in tear film Trace tear film debris, 1+ Punctate epithelial erosions   Anterior Chamber Deep, 1+ pigment Deep and quiet   Iris Round and moderately dilated, pigment clump at 0800 midzone Round and moderately dilated   Lens 2-3+ Nuclear sclerosis, 2-3+ Cortical cataract, Pseudoexfoliative material on anterior capsule 2+ Nuclear sclerosis, 2-3+ Cortical cataract, early pseudoexfoliation    Anterior Vitreous Mild Vitreous syneresis, Posterior vitreous detachment Mild Vitreous syneresis, vitreous condensations         Fundus Exam       Right Left   Disc Pink and Sharp, +cupping Pink and Sharp, +cupping   C/D Ratio 0.7 0.6   Macula Flat, Blunted foveal reflex, central IRH - improved, +edema / cystic changes centrally - improved, RPE mottling and clumping, no discreet targets for focal laser Flat, good foveal reflex, mild RPE mottling and clumping, rare MA, no edema   Vessels attenuated, mild tortuousity attenuated, tortuousity, mild Copper wiring, AV crossing changes   Periphery Attached, No heme  Attached, No heme            Refraction     Wearing Rx       Sphere Cylinder Axis   Right [place] +0.50 170   Left -0.75 +0.75 170            IMAGING AND PROCEDURES  Imaging and Procedures for _0 @  OCT, Retina - OU - Both Eyes       Right Eye Quality was good. Central Foveal Thickness: 288. Progression has improved. Findings include intraretinal fluid, no SRF,  abnormal foveal contour, intraretinal hyper-reflective material (interval improvement IRF greatest nasal fovea).   Left Eye Quality was good. Central Foveal Thickness: 265. Progression has been stable. Findings include normal foveal contour, no IRF, no SRF (Trace focal IRHM ).   Notes *Images captured and stored on  drive  Diagnosis / Impression:  OD: interval improvement in focal IRF nasal fovea OS: NFP, no IRF/SRF; tr focal IRHM  Clinical management:  See below  Abbreviations: NFP - Normal foveal profile. CME - cystoid macular edema. PED - pigment epithelial detachment. IRF - intraretinal fluid. SRF - subretinal fluid. EZ - ellipsoid zone. ERM - epiretinal membrane. ORA - outer retinal atrophy. ORT - outer retinal tubulation. SRHM - subretinal hyper-reflective material      Intravitreal Injection, Pharmacologic Agent - OD - Right Eye       Time Out 08/03/2021. 9:27 AM. Confirmed correct patient, procedure, site, and patient consented.   Anesthesia Topical anesthesia was used. Anesthetic medications included Lidocaine 2%, Proparacaine 0.5%.   Procedure Preparation included 5% betadine to ocular surface, eyelid speculum. A (32g) needle was used.   Injection: 2 mg aflibercept 2 MG/0.05ML   Route: Intravitreal, Site: Right Eye   NDC: [id number], Lot: [id number], Expiration date: 06/04/2022, Waste: 0.05 mL   Post-op Post injection exam found visual acuity of at least counting fingers. The patient tolerated the procedure well. There were no complications. The patient received written and verbal post procedure care education. Post injection medications were not given.            ASSESSMENT/PLAN:   ICD-10-CM   1. Hypertensive retinopathy of both eyes  H35.033 OCT, Retina - OU - Both Eyes    2. Essential hypertension  I10     3. Retinal hemorrhage of both eyes  H35.63     4. Branch retinal vein occlusion of right eye with macular edema  H34.8310 OCT, Retina - OU - Both  Eyes    Intravitreal Injection, Pharmacologic Agent - OD - Right Eye    aflibercept (EYLEA) SOLN 2 mg    5. Combined forms of age-related cataract of both eyes  H25.813     6. 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 Hypertensive retinopathy with retinal hemorrhage OU and retinal edema OD  - IVA OD #1 (10.11.22), #2 (11.08.22), #3 (12.06.22), #4 (1.3.23) -- IVA resistance  - s/p IVE OD #1 (sample) 01.31.23  - original OCT showed focal IRF/IRHM OD; OS with mild IRHM, no edema  - FA (01.14.21) shows focal, parafoveal hyperfluorescent leakage OU (OD >> OS)  - repeat FA 1.11.22 showed mild improvement in perifoveal leakage OD  - BP at initial visit 140s / 99 and 96 in R and L arms respectively  - suspect hemorrhage and edema related to HTN and use of eliquis for A fib  - differential includes mild, old BRVO w/ CME OD -- very similar in appearance to DME, but pt does not carry diagnosis of DM  - BP meds adjusted and BP improved  - exam and OCT shows interval improvement in IRF/cystic changes greatest nasally and BCVA OD stable at 20/25   - discussed possibility of IVA resistance  - pt's daughter reports development of macular edema around same time pt started latanoprost  - ?PGA-induced edema  - stopped latanoprost and started brimonidine BID OU -- no significant improvement in edema  - stopped PF and Prolensa  - recommend IVE OD #2 today, 03.01.23          - pt wishes to proceed with injection  - RBA of procedure discussed, questions answered - IVA informed consent obtained and signed, 10.11.22 (OD) - IVE informed consent obtained and signed,  01.31.23 (OD) - see procedure note - f/u 4 weeks, sooner prn -- DFE/OCT, possible injection  5. 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  - discussed diagnosis and progression  - not  yet visually significant  - monitor for now  6. 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 Ocular hypertension OU  - IOP 18,16 today  - started on Latanoprost by Dr. Parke Simmers  - edema OD started about the same time per daughter  - switched from [place] to brim due to possible macular edema from latan  - continue brim BID OU  Ophthalmic Meds Ordered this visit:  Meds ordered this encounter  Medications   aflibercept (EYLEA) SOLN 2 mg     Return in about 4 weeks (around 08/31/2021) for f/u HTN ret OD, DFE, OCT.  There are no Patient Instructions on file for this visit.  Explained the diagnoses, plan, and follow up with the patient and they expressed understanding.  Patient expressed understanding of the importance of proper follow up care.   This document serves as a record of services personally performed by Gardiner Sleeper, MD, PhD. It was created on their behalf by Orvan Falconer, an ophthalmic technician. The creation of this record is the provider's dictation and/or activities during the visit.    Electronically signed by: Orvan Falconer, OA, 08/03/21  9:41 AM  This document serves as a record of services personally performed by Gardiner Sleeper, MD, PhD. It was created on their behalf by San Jetty. Owens Shark, OA an ophthalmic technician. The creation of this record is the provider's dictation and/or activities during the visit.    Electronically signed by: San Jetty. Owens Shark, [place] 03.01.2023 9:41 AM   Gardiner Sleeper, M.D., Ph.D. Diseases & Surgery of the Retina and Vitreous [facility]  I have reviewed the above documentation for accuracy and completeness, and I agree with the above. Gardiner Sleeper, M.D., Ph.D. 08/03/21 9:41 AM   Abbreviations: M myopia (nearsighted); A astigmatism; H hyperopia (farsighted); P presbyopia; Mrx spectacle prescription;  CTL contact lenses;  OD right eye; OS left eye; OU both eyes  XT exotropia; ET esotropia; PEK punctate epithelial keratitis; PEE punctate epithelial erosions; DES dry eye syndrome; MGD meibomian gland dysfunction; ATs artificial tears; PFAT's preservative free artificial tears; [place] nuclear sclerotic cataract; PSC posterior subcapsular cataract; ERM epi-retinal membrane; PVD posterior vitreous detachment; RD retinal detachment; DM diabetes mellitus; DR diabetic retinopathy; NPDR non-proliferative diabetic retinopathy; PDR proliferative diabetic retinopathy; CSME clinically significant macular edema; DME diabetic macular edema; dbh dot blot hemorrhages; CWS cotton wool spot; POAG primary open angle glaucoma; C/D cup-to-disc ratio; HVF humphrey visual field; GVF goldmann visual field; OCT optical coherence tomography; IOP intraocular pressure; BRVO Branch retinal vein occlusion; CRVO central retinal vein occlusion; CRAO central retinal artery occlusion; BRAO branch retinal artery occlusion; RT retinal tear; SB scleral buckle; PPV pars plana vitrectomy; VH Vitreous hemorrhage; PRP panretinal laser photocoagulation; IVK intravitreal kenalog; VMT vitreomacular traction; MH Macular hole;  NVD neovascularization of the disc; NVE neovascularization elsewhere; AREDS age related eye disease study; ARMD age related macular degeneration; POAG primary open angle glaucoma; EBMD epithelial/anterior basement membrane dystrophy; ACIOL anterior chamber intraocular lens; IOL intraocular lens; PCIOL posterior chamber intraocular lens; Phaco/IOL phacoemulsification with intraocular lens placement; PRK photorefractive keratectomy; LASIK laser assisted in situ keratomileusis; HTN  hypertension; DM diabetes mellitus; COPD chronic obstructive pulmonary disease

## 2021-08-03 ENCOUNTER — Other Ambulatory Visit: Payer: Self-pay

## 2021-08-03 ENCOUNTER — Encounter (INDEPENDENT_AMBULATORY_CARE_PROVIDER_SITE_OTHER): Payer: Self-pay | Admitting: Ophthalmology

## 2021-08-03 ENCOUNTER — Ambulatory Visit (INDEPENDENT_AMBULATORY_CARE_PROVIDER_SITE_OTHER): Payer: Medicare Other | Admitting: Ophthalmology

## 2021-08-03 DIAGNOSIS — H268 Other specified cataract: Secondary | ICD-10-CM | POA: Diagnosis not present

## 2021-08-03 DIAGNOSIS — I10 Essential (primary) hypertension: Secondary | ICD-10-CM

## 2021-08-03 DIAGNOSIS — H35033 Hypertensive retinopathy, bilateral: Secondary | ICD-10-CM

## 2021-08-03 DIAGNOSIS — H40053 Ocular hypertension, bilateral: Secondary | ICD-10-CM | POA: Diagnosis not present

## 2021-08-03 DIAGNOSIS — H3563 Retinal hemorrhage, bilateral: Secondary | ICD-10-CM

## 2021-08-03 DIAGNOSIS — H34831 Tributary (branch) retinal vein occlusion, right eye, with macular edema: Secondary | ICD-10-CM

## 2021-08-03 DIAGNOSIS — H25813 Combined forms of age-related cataract, bilateral: Secondary | ICD-10-CM

## 2021-08-03 MED ORDER — AFLIBERCEPT 2MG/0.05ML IZ SOLN FOR KALEIDOSCOPE
2.0000 mg | INTRAVITREAL | Status: AC | PRN
  Administered 2021-08-03: 2 mg via INTRAVITREAL

## 2021-08-18 ENCOUNTER — Other Ambulatory Visit: Payer: Self-pay | Admitting: Physician Assistant

## 2021-08-23 NOTE — Progress Notes (Addendum)
?[facility] - Clinic Note ? ?08/31/2021 ? ?  ? ?CHIEF COMPLAINT ?Patient presents for Retina Follow Up ? ?HISTORY OF PRESENT ILLNESS: ?Billy Harrell is a 71 y.o. male who presents to the clinic today for:  ? ?HPI   ? ? Retina Follow Up   ?Patient presents with  Other (HTN retinopathy with retinal heme and edema).  In both eyes.  Severity is moderate.  Since onset it is stable.  I, the attending physician,  performed the HPI with the patient and updated documentation appropriately. ? ?  ?  ? ? Comments   ?Patient states vision the same OU. Using cosopt bid OU. Stopped brimonidine and latanoprost. ? ?  ?  ?Last edited by Rennis Chris, MD on 08/31/2021 12:34 PM.  ?  ? ?Referring physician: ?Diona Foley, MD ?[phone] ?[place],  [place] [address] ? ?HISTORICAL INFORMATION:  ? ?Selected notes from the MEDICAL RECORD NUMBER ?Referred by Dr. Krista Blue for retina eval  ? ?CURRENT MEDICATIONS: ?Current Outpatient Medications (Ophthalmic Drugs)  ?Medication Sig  ? dorzolamide-timolol (COSOPT) 22.3-6.8 MG/ML ophthalmic solution Place 1 drop into both eyes 2 (two) times daily.  ? brimonidine (ALPHAGAN) 0.2 % ophthalmic solution Place 1 drop into both eyes 2 (two) times daily. (Patient not taking: Reported on 05/10/2021)  ? latanoprost (XALATAN) 0.005 % ophthalmic solution Place 1 drop into both eyes at bedtime. (Patient not taking: Reported on 08/31/2021)  ? ?No current facility-administered medications for this visit. (Ophthalmic Drugs)  ? ?Current Outpatient Medications (Other)  ?Medication Sig  ? acetaminophen (TYLENOL) 500 MG tablet Take 500 mg by mouth as needed for mild pain.   ? amLODipine (NORVASC) 5 MG tablet Take 1 tablet by mouth once daily  ? apixaban (ELIQUIS) 5 MG TABS tablet Take 1 tablet by mouth twice daily.  Keep scheduled follow-up with Dr Tenny Craw for FUTURE refills.  Will need Labwork repeated as well.  ? carvedilol (COREG) 6.25 MG tablet Take 1 tablet by mouth twice daily  ? fluticasone  (FLONASE) 50 MCG/ACT nasal spray 1 spray in each nostril  ? Multiple Vitamin (MULTI-VITAMIN PO) Take 1 tablet by mouth daily.  ? Omega-3 Fatty Acids (FISH OIL) 1000 MG CAPS Take 1,000 mg by mouth as needed (for added supplement).   ? tamsulosin (FLOMAX) 0.4 MG CAPS capsule Take 0.4 mg by mouth daily.  ? ?No current facility-administered medications for this visit. (Other)  ? ?REVIEW OF SYSTEMS: ?ROS   ?Positive for: Genitourinary, Endocrine, Cardiovascular, Eyes ?Negative for: Constitutional, Gastrointestinal, Neurological, Skin, Musculoskeletal, HENT, Respiratory, Psychiatric, Allergic/Imm, Heme/Lymph ?Last edited by Annalee Genta D, COT on 08/31/2021  8:49 AM.  ?  ? ?ALLERGIES ?No Known Allergies ? ?PAST MEDICAL HISTORY ?Past Medical History:  ?Diagnosis Date  ? Cataract   ? Mixed form OU  ? Chronic kidney disease   ? ACE inhibitor and HCTZ DCd in past due to rising Creatinine  ? Hypertension   ? [place] 6/19:  neg for RA stenosis bilat; normal mesenteric arteries  ? Hypertensive retinopathy   ? OU  ? Permanent atrial fibrillation   ? s/p DCCV 04/2017 >> failed // CHADS2-VASc=2 >> Apixaban Rx  ? ?Past Surgical History:  ?Procedure Laterality Date  ? CARDIOVERSION N/A 04/30/2017  ? Procedure: CARDIOVERSION;  Surgeon: Wendall Stade, MD;  [facility] END[facility];  Service: Cardiovascular;  Laterality: N/A;  ? ?FAMILY HISTORY ?History reviewed. No pertinent family history. ? ?SOCIAL HISTORY ?Social History  ? ?Tobacco Use  ? Smoking status:  Never  ? Smokeless tobacco: Never  ?Vaping Use  ? Vaping Use: Never used  ?Substance Use Topics  ? Alcohol use: No  ? Drug use: No  ?  ? ?  ?OPHTHALMIC EXAM: ? ?Base Eye Exam   ? ? Visual Acuity (Snellen - Linear)   ? ?   Right Left  ? Dist [place] 20/30 20/25 -2  ? Dist ph [place] 20/25 20/20 -2  ? ? Correction: Glasses  ? ?  ?  ? ? Tonometry (Tonopen, 8:58 AM)   ? ?   Right Left  ? Pressure 20 22  ? ?  ?  ? ? Pupils   ? ?   Dark Light Shape React APD  ? Right 3 2 Round Minimal None  ? Left 3 2  Round Minimal None  ? ?  ?  ? ? Visual Fields (Counting fingers)   ? ?   Left Right  ?  Full Full  ? ?  ?  ? ? Extraocular Movement   ? ?   Right Left  ?  Full, Ortho Full, Ortho  ? ?  ?  ? ? Neuro/Psych   ? ? Oriented x3: Yes  ? Mood/Affect: Normal  ? ?  ?  ? ? Dilation   ? ? Both eyes: 1.0% Mydriacyl, 2.5% Phenylephrine @ 8:58 AM  ? ?  ?  ? ?  ? ?Slit Lamp and Fundus Exam   ? ? Slit Lamp Exam   ? ?   Right Left  ? Lids/Lashes Dermatochalasis - upper lid, Meibomian gland dysfunction Dermatochalasis - upper lid, Meibomian gland dysfunction  ? Conjunctiva/Sclera Mild Melanosis Mild Melanosis  ? Cornea 1-2+Punctate epithelial erosions, mild Debris in tear film Trace tear film debris, 1+ Punctate epithelial erosions  ? Anterior Chamber Deep, 1+ pigment Deep and quiet  ? Iris Round and moderately dilated, pigment clump at 0800 midzone Round and moderately dilated  ? Lens 2-3+ Nuclear sclerosis, 2-3+ Cortical cataract, Pseudoexfoliative material on anterior capsule 2+ Nuclear sclerosis, 2-3+ Cortical cataract, early pseudoexfoliation   ? Anterior Vitreous Mild Vitreous syneresis, Posterior vitreous detachment Mild Vitreous syneresis, vitreous condensations  ? ?  ?  ? ? Fundus Exam   ? ?   Right Left  ? Disc Pink and Sharp, +cupping Pink and Sharp, +cupping  ? C/D Ratio 0.7 0.6  ? Macula Flat, Blunted foveal reflex, central IRH - improved, +edema / cystic changes centrally - improved, RPE mottling and clumping, no discreet targets for focal laser Flat, good foveal reflex, mild RPE mottling and clumping, rare MA, no edema  ? Vessels attenuated, Tortuous attenuated, tortuousity, mild Copper wiring, AV crossing changes  ? Periphery Attached, No heme  Attached, No heme   ? ?  ?  ? ?  ? ?Refraction   ? ? Wearing Rx   ? ?   Sphere Cylinder Axis  ? Right [place] +0.50 170  ? Left -0.75 +0.75 170  ? ?  ?  ? ?  ? ?IMAGING AND PROCEDURES  ?Imaging and Procedures for @TODAY @ ? ?OCT, Retina - OU - Both Eyes   ? ?   ?Right Eye ?Quality  was good. Central Foveal Thickness: 272. Progression has improved. Findings include intraretinal fluid, no SRF, abnormal foveal contour, intraretinal hyper-reflective material (interval improvement IRF/cystic changes nasal fovea).  ? ?Left Eye ?Quality was good. Central Foveal Thickness: 257. Progression has been stable. Findings include normal foveal contour, no IRF, no SRF (Trace focal IRHM ).  ? ?  Notes ?*Images captured and stored on drive ? ?Diagnosis / Impression:  ?OD: interval improvement IRF/cystic changes nasal fovea ?OS: NFP, no IRF/SRF; tr focal IRHM ? ?Clinical management:  ?See below ? ?Abbreviations: NFP - Normal foveal profile. CME - cystoid macular edema. PED - pigment epithelial detachment. IRF - intraretinal fluid. SRF - subretinal fluid. EZ - ellipsoid zone. ERM - epiretinal membrane. ORA - outer retinal atrophy. ORT - outer retinal tubulation. SRHM - subretinal hyper-reflective material ? ? ?  ? ?Intravitreal Injection, Pharmacologic Agent - OD - Right Eye   ? ?   ?Time Out ?08/31/2021. 9:37 AM. Confirmed correct patient, procedure, site, and patient consented.  ? ?Anesthesia ?Topical anesthesia was used. Anesthetic medications included Lidocaine 2%, Proparacaine 0.5%.  ? ?Procedure ?Preparation included 5% betadine to ocular surface, eyelid speculum. A (32g) needle was used.  ? ?Injection: ?2 mg aflibercept 2 MG/0.05ML ?  Route: Intravitreal, Site: Right Eye ?  NDC: [id number], Lot: [id number], Expiration date: 05/04/2022, Waste: 0.05 mL  ? ?Post-op ?Post injection exam found visual acuity of at least counting fingers. The patient tolerated the procedure well. There were no complications. The patient received written and verbal post procedure care education. Post injection medications were not given.  ? ?  ?  ?  ? ?  ?ASSESSMENT/PLAN: ?  ICD-10-CM   ?1. Hypertensive retinopathy of both eyes  H35.033 OCT, Retina - OU - Both Eyes  ?  ?2. Essential hypertension  I10   ?  ?3. Retinal hemorrhage of  both eyes  H35.63   ?  ?4. Branch retinal vein occlusion of right eye with macular edema  H34.8310 OCT, Retina - OU - Both Eyes  ?  Intravitreal Injection, Pharmacologic Agent - OD - Right Eye  ?  afl

## 2021-08-31 ENCOUNTER — Ambulatory Visit (INDEPENDENT_AMBULATORY_CARE_PROVIDER_SITE_OTHER): Payer: Medicare Other | Admitting: Ophthalmology

## 2021-08-31 ENCOUNTER — Encounter (INDEPENDENT_AMBULATORY_CARE_PROVIDER_SITE_OTHER): Payer: Self-pay | Admitting: Ophthalmology

## 2021-08-31 ENCOUNTER — Other Ambulatory Visit: Payer: Self-pay

## 2021-08-31 DIAGNOSIS — I10 Essential (primary) hypertension: Secondary | ICD-10-CM | POA: Diagnosis not present

## 2021-08-31 DIAGNOSIS — H40053 Ocular hypertension, bilateral: Secondary | ICD-10-CM

## 2021-08-31 DIAGNOSIS — H3563 Retinal hemorrhage, bilateral: Secondary | ICD-10-CM | POA: Diagnosis not present

## 2021-08-31 DIAGNOSIS — H35033 Hypertensive retinopathy, bilateral: Secondary | ICD-10-CM | POA: Diagnosis not present

## 2021-08-31 DIAGNOSIS — H25813 Combined forms of age-related cataract, bilateral: Secondary | ICD-10-CM | POA: Diagnosis not present

## 2021-08-31 DIAGNOSIS — H268 Other specified cataract: Secondary | ICD-10-CM

## 2021-08-31 DIAGNOSIS — H34831 Tributary (branch) retinal vein occlusion, right eye, with macular edema: Secondary | ICD-10-CM

## 2021-08-31 MED ORDER — AFLIBERCEPT 2MG/0.05ML IZ SOLN FOR KALEIDOSCOPE
2.0000 mg | INTRAVITREAL | Status: AC | PRN
  Administered 2021-08-31: 2 mg via INTRAVITREAL

## 2021-09-22 NOTE — Progress Notes (Signed)
?[facility] Note ? ?09/28/2021 ? ?  ? ?CHIEF COMPLAINT ?Patient presents for Retina Follow Up ? ?HISTORY OF PRESENT ILLNESS: ?Billy Harrell is a 71 y.o. male who presents to the clinic today for:  ? ?HPI   ? ? Retina Follow Up   ?Patient presents with  Other (HTN ret ou).  In both eyes.  Severity is moderate.  Duration of 4 weeks.  Since onset it is stable.  I, the attending physician,  performed the HPI with the patient and updated documentation appropriately. ? ?  ?  ? ? Comments   ?Patient states vision the same OU. Using cosopt bid OU. ? ?  ?  ?Last edited by Bernarda Caffey, MD on 09/28/2021 11:12 PM.  ?  ?Pt states vision is stable, small letters run together when trying to read ? ?Referring physician: ?Lawerance Cruel, MD ?[address] ?[place],  [place] [address] ? ?HISTORICAL INFORMATION:  ? ?Selected notes from the [facility] ?Referred by Dr. Parke Simmers for retina eval  ? ?CURRENT MEDICATIONS: ?Current Outpatient Medications (Ophthalmic Drugs)  ?Medication Sig  ? dorzolamide-timolol (COSOPT) 22.3-6.8 MG/ML ophthalmic solution Place 1 drop into both eyes 2 (two) times daily.  ? brimonidine (ALPHAGAN) 0.2 % ophthalmic solution Place 1 drop into both eyes 2 (two) times daily. (Patient not taking: Reported on 05/10/2021)  ? latanoprost (XALATAN) 0.005 % ophthalmic solution Place 1 drop into both eyes at bedtime. (Patient not taking: Reported on 08/31/2021)  ? ?No current facility-administered medications for this visit. (Ophthalmic Drugs)  ? ?Current Outpatient Medications (Other)  ?Medication Sig  ? acetaminophen (TYLENOL) 500 MG tablet Take 500 mg by mouth as needed for mild pain.   ? amLODipine (NORVASC) 5 MG tablet Take 1 tablet by mouth once daily  ? apixaban (ELIQUIS) 5 MG TABS tablet Take 1 tablet by mouth twice daily.  Keep scheduled follow-up with Dr Harrington Challenger for FUTURE refills.  Will need Labwork repeated as well.  ? carvedilol (COREG) 6.25 MG tablet Take 1 tablet by  mouth twice daily  ? fluticasone (FLONASE) 50 MCG/ACT nasal spray 1 spray in each nostril  ? Multiple Vitamin (MULTI-VITAMIN PO) Take 1 tablet by mouth daily.  ? Omega-3 Fatty Acids (FISH OIL) 1000 MG CAPS Take 1,000 mg by mouth as needed (for added supplement).   ? tamsulosin (FLOMAX) 0.4 MG CAPS capsule Take 0.4 mg by mouth daily.  ? ?No current facility-administered medications for this visit. (Other)  ? ?REVIEW OF SYSTEMS: ?ROS   ?Positive for: Genitourinary, Endocrine, Cardiovascular, Eyes ?Negative for: Constitutional, Gastrointestinal, Neurological, Skin, Musculoskeletal, HENT, Respiratory, Psychiatric, Allergic/Imm, Heme/Lymph ?Last edited by Roselee Nova D, COT on 09/28/2021  8:52 AM.  ?  ? ?ALLERGIES ?No Known Allergies ? ?PAST MEDICAL HISTORY ?Past Medical History:  ?Diagnosis Date  ? Cataract   ? Mixed form OU  ? Chronic kidney disease   ? ACE inhibitor and HCTZ DCd in past due to rising Creatinine  ? Hypertension   ? [place] 6/19:  neg for RA stenosis bilat; normal mesenteric arteries  ? Hypertensive retinopathy   ? OU  ? Permanent atrial fibrillation   ? s/p DCCV 04/2017 >> failed // CHADS2-VASc=2 >> Apixaban Rx  ? ?Past Surgical History:  ?Procedure Laterality Date  ? CARDIOVERSION N/A 04/30/2017  ? Procedure: CARDIOVERSION;  Surgeon: Josue Hector, MD;  Location: [facility] ENDOSCOPY;  Service: Cardiovascular;  Laterality: N/A;  ? ?FAMILY HISTORY ?History reviewed. No pertinent family history. ? ?SOCIAL  HISTORY ?Social History  ? ?Tobacco Use  ? Smoking status: Never  ? Smokeless tobacco: Never  ?Vaping Use  ? Vaping Use: Never used  ?Substance Use Topics  ? Alcohol use: No  ? Drug use: No  ?  ? ?  ?OPHTHALMIC EXAM: ? ?Base Eye Exam   ? ? Visual Acuity (Snellen - Linear)   ? ?   Right Left  ? Dist [place] 20/30 -1 20/25 -1  ? Dist ph [place] 20/25 -2 20/20 -2  ? ? Correction: Glasses  ? ?  ?  ? ? Tonometry (Tonopen, 8:58 AM)   ? ?   Right Left  ? Pressure 19 19  ? ?  ?  ? ? Pupils   ? ?   Dark Light Shape React APD  ?  Right 3 2 Round Minimal None  ? Left 3 2 Round Minimal None  ? ?  ?  ? ? Visual Fields (Counting fingers)   ? ?   Left Right  ?  Full Full  ? ?  ?  ? ? Extraocular Movement   ? ?   Right Left  ?  Full, Ortho Full, Ortho  ? ?  ?  ? ? Neuro/Psych   ? ? Oriented x3: Yes  ? Mood/Affect: Normal  ? ?  ?  ? ? Dilation   ? ? Both eyes: 1.0% Mydriacyl, 2.5% Phenylephrine @ 8:58 AM  ? ?  ?  ? ?  ? ?Slit Lamp and Fundus Exam   ? ? Slit Lamp Exam   ? ?   Right Left  ? Lids/Lashes Dermatochalasis - upper lid, Meibomian gland dysfunction Dermatochalasis - upper lid, Meibomian gland dysfunction  ? Conjunctiva/Sclera Mild Melanosis Mild Melanosis  ? Cornea 1-2+Punctate epithelial erosions, mild Debris in tear film Trace tear film debris, 1+ Punctate epithelial erosions  ? Anterior Chamber Deep, 1+ pigment Deep and quiet  ? Iris Round and moderately dilated, pigment clump at 0800 midzone Round and moderately dilated  ? Lens 2-3+ Nuclear sclerosis, 2-3+ Cortical cataract, Pseudoexfoliative material on anterior capsule 2+ Nuclear sclerosis, 2-3+ Cortical cataract, early pseudoexfoliation   ? Anterior Vitreous Mild Vitreous syneresis, Posterior vitreous detachment Mild Vitreous syneresis, vitreous condensations  ? ?  ?  ? ? Fundus Exam   ? ?   Right Left  ? Disc Pink and Sharp, +cupping Pink and Sharp, +cupping  ? C/D Ratio 0.7 0.6  ? Macula Flat, Blunted foveal reflex, central IRH - improved, +edema / cystic changes centrally - improved, RPE mottling and clumping, no discreet targets for focal laser Flat, good foveal reflex, mild RPE mottling and clumping, rare MA, no edema  ? Vessels attenuated, Tortuous attenuated, Tortuous  ? Periphery Attached, No heme  Attached, No heme   ? ?  ?  ? ?  ? ?Refraction   ? ? Wearing Rx   ? ?   Sphere Cylinder Axis  ? Right [place] +0.50 170  ? Left -0.75 +0.75 170  ? ?  ?  ? ?  ? ?IMAGING AND PROCEDURES  ?Imaging and Procedures for $RemoveBefore'@[id number]$ @ ? ?OCT, Retina - OU - Both Eyes   ? ?   ?Right Eye ?Quality was  good. Central Foveal Thickness: 272. Progression has improved. Findings include intraretinal fluid, no SRF, abnormal foveal contour, intraretinal hyper-reflective material (interval improvement in IRF/cystic changes and IRHM nasal fovea).  ? ?Left Eye ?Quality was good. Central Foveal Thickness: 259. Progression has been stable. Findings include normal foveal  contour, no IRF, no SRF.  ? ?Notes ?*Images captured and stored on drive ? ?Diagnosis / Impression:  ?OD: interval improvement in IRF/cystic changes and IRHM nasal fovea ?OS: NFP, no IRF/SRF ? ?Clinical management:  ?See below ? ?Abbreviations: NFP - Normal foveal profile. CME - cystoid macular edema. PED - pigment epithelial detachment. IRF - intraretinal fluid. SRF - subretinal fluid. EZ - ellipsoid zone. ERM - epiretinal membrane. ORA - outer retinal atrophy. ORT - outer retinal tubulation. SRHM - subretinal hyper-reflective material ? ? ?  ? ?Intravitreal Injection, Pharmacologic Agent - OD - Right Eye   ? ?   ?Time Out ?09/28/2021. 9:40 AM. Confirmed correct patient, procedure, site, and patient consented.  ? ?Anesthesia ?Topical anesthesia was used. Anesthetic medications included Lidocaine 2%, Proparacaine 0.5%.  ? ?Procedure ?Preparation included 5% betadine to ocular surface, eyelid speculum. A (32g) needle was used.  ? ?Injection: ?2 mg aflibercept 2 MG/0.05ML ?  Route: Intravitreal, Site: Right Eye ?  [place]: [id number], Lot: [id number], Expiration date: 08/02/2022, Waste: 0 mL  ? ?Post-op ?Post injection exam found visual acuity of at least counting fingers. The patient tolerated the procedure well. There were no complications. The patient received written and verbal post procedure care education. Post injection medications were not given.  ? ?  ?  ?  ? ?  ?ASSESSMENT/PLAN: ?  ICD-10-CM   ?1. Essential hypertension  I10   ?  ?2. Hypertensive retinopathy of both eyes  H35.033 OCT, Retina - OU - Both Eyes  ?  ?3. Retinal hemorrhage of both eyes  H35.63    ?  ?4. Branch retinal vein occlusion of right eye with macular edema  H34.8310 OCT, Retina - OU - Both Eyes  ?  Intravitreal Injection, Pharmacologic Agent - OD - Right Eye  ?  aflibercept (EYLEA) SOLN

## 2021-09-28 ENCOUNTER — Encounter (INDEPENDENT_AMBULATORY_CARE_PROVIDER_SITE_OTHER): Payer: Self-pay | Admitting: Ophthalmology

## 2021-09-28 ENCOUNTER — Ambulatory Visit (INDEPENDENT_AMBULATORY_CARE_PROVIDER_SITE_OTHER): Payer: Medicare Other | Admitting: Ophthalmology

## 2021-09-28 DIAGNOSIS — H34831 Tributary (branch) retinal vein occlusion, right eye, with macular edema: Secondary | ICD-10-CM

## 2021-09-28 DIAGNOSIS — H35033 Hypertensive retinopathy, bilateral: Secondary | ICD-10-CM | POA: Diagnosis not present

## 2021-09-28 DIAGNOSIS — H268 Other specified cataract: Secondary | ICD-10-CM

## 2021-09-28 DIAGNOSIS — I10 Essential (primary) hypertension: Secondary | ICD-10-CM | POA: Diagnosis not present

## 2021-09-28 DIAGNOSIS — H3563 Retinal hemorrhage, bilateral: Secondary | ICD-10-CM | POA: Diagnosis not present

## 2021-09-28 DIAGNOSIS — H25813 Combined forms of age-related cataract, bilateral: Secondary | ICD-10-CM

## 2021-09-28 DIAGNOSIS — H40053 Ocular hypertension, bilateral: Secondary | ICD-10-CM

## 2021-09-28 MED ORDER — AFLIBERCEPT 2MG/0.05ML IZ SOLN FOR KALEIDOSCOPE
2.0000 mg | INTRAVITREAL | Status: AC | PRN
  Administered 2021-09-28: 2 mg via INTRAVITREAL

## 2021-10-19 ENCOUNTER — Other Ambulatory Visit: Payer: Self-pay

## 2021-10-19 MED ORDER — APIXABAN 5 MG PO TABS
ORAL_TABLET | ORAL | 3 refills | Status: DC
Start: 2021-10-19 — End: 2022-08-10

## 2021-10-19 NOTE — Telephone Encounter (Signed)
Prescription refill request for Eliquis received. ?Indication: Atrial Fib ?Last office visit: 04/05/21  Lovina Reach MD ?Scr: 1.50 on 04/05/21 ?Age: 71 ?Weight: 82.1kg ? ?Based on above findings Eliquis 5mg  twice daily is the appropriate dose.  Refill approved. ? ?

## 2021-10-20 NOTE — Progress Notes (Signed)
[facility] - Clinic Note  10/26/2021     CHIEF COMPLAINT Patient presents for Retina Follow Up  HISTORY OF PRESENT ILLNESS: Billy Harrell is a 71 y.o. male who presents to the clinic today for:   HPI     Retina Follow Up   Patient presents with  Other.  In both eyes.  This started 4 weeks ago.  I, the attending physician,  performed the HPI with the patient and updated documentation appropriately.        Comments   Patient here for 4 weeks retina follow up for HTN Ret w/ RH OU. Patient states vision much better. No eye pain.       Last edited by Rennis Chris, MD on 10/26/2021  8:45 AM.    Pt feels like vision is improving  Referring physician: Daisy Floro, MD [address] [place],  [place] [address]  HISTORICAL INFORMATION:   Selected notes from the MEDICAL RECORD NUMBER Referred by Dr. Krista Blue for retina eval   CURRENT MEDICATIONS: Current Outpatient Medications (Ophthalmic Drugs)  Medication Sig   dorzolamide-timolol (COSOPT) 22.3-6.8 MG/ML ophthalmic solution Place 1 drop into both eyes 2 (two) times daily.   brimonidine (ALPHAGAN) 0.2 % ophthalmic solution Place 1 drop into both eyes 2 (two) times daily. (Patient not taking: Reported on 05/10/2021)   latanoprost (XALATAN) 0.005 % ophthalmic solution Place 1 drop into both eyes at bedtime. (Patient not taking: Reported on 08/31/2021)   No current facility-administered medications for this visit. (Ophthalmic Drugs)   Current Outpatient Medications (Other)  Medication Sig   acetaminophen (TYLENOL) 500 MG tablet Take 500 mg by mouth as needed for mild pain.    amLODipine (NORVASC) 5 MG tablet Take 1 tablet by mouth once daily   apixaban (ELIQUIS) 5 MG TABS tablet Take 1 tablet by mouth twice daily.   carvedilol (COREG) 6.25 MG tablet Take 1 tablet by mouth twice daily   fluticasone (FLONASE) 50 MCG/ACT nasal spray 1 spray in each nostril   Multiple Vitamin (MULTI-VITAMIN PO) Take 1  tablet by mouth daily.   Omega-3 Fatty Acids (FISH OIL) 1000 MG CAPS Take 1,000 mg by mouth as needed (for added supplement).    tamsulosin (FLOMAX) 0.4 MG CAPS capsule Take 0.4 mg by mouth daily.   No current facility-administered medications for this visit. (Other)   REVIEW OF SYSTEMS: ROS   Positive for: Genitourinary, Endocrine, Cardiovascular, Eyes Negative for: Constitutional, Gastrointestinal, Neurological, Skin, Musculoskeletal, HENT, Respiratory, Psychiatric, Allergic/Imm, Heme/Lymph Last edited by Laddie Aquas, COA on 10/26/2021  8:33 AM.     ALLERGIES No Known Allergies  PAST MEDICAL HISTORY Past Medical History:  Diagnosis Date   Cataract    Mixed form OU   Chronic kidney disease    ACE inhibitor and HCTZ DCd in past due to rising Creatinine   Hypertension    [place] 6/19:  neg for RA stenosis bilat; normal mesenteric arteries   Hypertensive retinopathy    OU   Permanent atrial fibrillation    s/p DCCV 04/2017 >> failed // CHADS2-VASc=2 >> Apixaban Rx   Past Surgical History:  Procedure Laterality Date   CARDIOVERSION N/A 04/30/2017   Procedure: CARDIOVERSION;  Surgeon: Wendall Stade, MD;  Location: [facility] ENDOSCOPY;  Service: Cardiovascular;  Laterality: N/A;   FAMILY HISTORY History reviewed. No pertinent family history.  SOCIAL HISTORY Social History   Tobacco Use   Smoking status: Never   Smokeless tobacco: Never  Vaping Use  Vaping Use: Never used  Substance Use Topics   Alcohol use: No   Drug use: No       OPHTHALMIC EXAM:  Base Eye Exam     Visual Acuity (Snellen - Linear)       Right Left   Dist [place] 20/30 +2 20/20   Dist ph [place] 20/25 -2          Tonometry (Tonopen, 8:30 AM)       Right Left   Pressure 17 19         Pupils       Dark Light Shape React APD   Right 3 2 Round Minimal None   Left 3 2 Round Minimal None         Visual Fields (Counting fingers)       Left Right    Full Full         Extraocular Movement        Right Left    Full, Ortho Full, Ortho         Neuro/Psych     Oriented x3: Yes   Mood/Affect: Normal         Dilation     Both eyes: 1.0% Mydriacyl, 2.5% Phenylephrine @ 8:30 AM           Slit Lamp and Fundus Exam     Slit Lamp Exam       Right Left   Lids/Lashes Dermatochalasis - upper lid, Meibomian gland dysfunction Dermatochalasis - upper lid, Meibomian gland dysfunction   Conjunctiva/Sclera Mild Melanosis Mild Melanosis   Cornea 1-2+Punctate epithelial erosions, mild Debris in tear film Trace tear film debris, 1+ Punctate epithelial erosions   Anterior Chamber Deep, 1+ pigment Deep and quiet   Iris Round and moderately dilated, pigment clump at 0800 midzone Round and moderately dilated   Lens 2-3+ Nuclear sclerosis, 2-3+ Cortical cataract, Pseudoexfoliative material on anterior capsule 2+ Nuclear sclerosis, 2-3+ Cortical cataract, early pseudoexfoliation   Anterior Vitreous Mild Vitreous syneresis, Posterior vitreous detachment Mild Vitreous syneresis, vitreous condensations         Fundus Exam       Right Left   Disc Pink and Sharp, +cupping Pink and Sharp, +cupping   C/D Ratio 0.7 0.6   Macula Flat, Blunted foveal reflex, central IRH - improved, +edema / cystic changes centrally, RPE mottling and clumping, no discreet targets for focal laser Flat, good foveal reflex, mild RPE mottling and clumping, rare MA, no edema   Vessels attenuated, Tortuous mild attenuation, mild tortuosity   Periphery Attached, No heme  Attached, No heme           Refraction     Wearing Rx       Sphere Cylinder Axis   Right [place] +0.50 170   Left -0.75 +0.75 170           IMAGING AND PROCEDURES  Imaging and Procedures for $RemoveBefore'@[email or web address]$ @  Intravitreal Injection, Pharmacologic Agent - OD - Right Eye       Time Out 10/26/2021. 8:38 AM. Confirmed correct patient, procedure, site, and patient consented.   Anesthesia Topical anesthesia was used. Anesthetic  medications included Lidocaine 2%, Proparacaine 0.5%.   Procedure Preparation included 5% betadine to ocular surface, eyelid speculum. A (32g) needle was used.   Injection: 2 mg aflibercept 2 MG/0.05ML   Route: Intravitreal, Site: Right Eye   NDC: [id number], Waste: 0 mL   Post-op Post injection exam found visual acuity of at least counting fingers. The  patient tolerated the procedure well. There were no complications. The patient received written and verbal post procedure care education. Post injection medications were not given.             ASSESSMENT/PLAN:   ICD-10-CM   1. Essential hypertension  I10     2. Hypertensive retinopathy of both eyes  H35.033     3. Retinal hemorrhage of both eyes  H35.63 CANCELED: OCT, Retina - OU - Both Eyes    4. Branch retinal vein occlusion of right eye with macular edema  H34.8310 Intravitreal Injection, Pharmacologic Agent - OD - Right Eye    5. Combined forms of age-related cataract of both eyes  H25.813     6. 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 Hypertensive retinopathy with retinal hemorrhage OU and retinal edema OD  - IVA OD #1 (10.11.22), #2 (11.08.22), #3 (12.06.22), #4 (1.3.23) -- IVA resistance  - s/p IVE OD #1 (sample) 01.31.23, #2 (03.01.23), #3 (03.29.23), #4 (04.20.23)  - original OCT showed focal IRF/IRHM OD; OS with mild IRHM, no edema  - FA (01.14.21) shows focal, parafoveal hyperfluorescent leakage OU (OD >> OS)  - repeat FA 1.11.22 showed mild improvement in perifoveal leakage OD  - BP at initial visit 140s / 99 and 96 in R and L arms respectively  - suspect hemorrhage and edema related to HTN and use of eliquis for A fib  - differential includes mild, old BRVO w/ CME OD -- very similar in appearance to DME, but pt does not carry diagnosis of DM  - BP meds adjusted and BP improved  - exam shows interval improvement in IRF/cystic changes  greatest nasally and BCVA OD stable at 20/25   - OCT images not obtained today  - pt's daughter reports development of macular edema around same time pt started latanoprost  - ?PGA-induced edema  - stopped latanoprost and started brimonidine BID OU -- no significant improvement in edema  - stopped PF and Prolensa  - recommend IVE OD #5 today, 05.24.23          - pt wishes to proceed with injection  - RBA of procedure discussed, questions answered - IVA informed consent obtained and signed, 10.11.22 (OD) - IVE informed consent obtained and signed, 01.31.23 (OD) - see procedure note - f/u 4 weeks, sooner prn -- DFE/OCT, possible injection  5. 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 Ocular hypertension OU  - IOP 17,19 today  - started on Latanoprost by Dr. Parke Simmers  - edema OD started about the same time per daughter  - switched from [place] to brim due to possible macular edema from latan  - continue brim BID OU  Ophthalmic Meds Ordered this visit:  No orders of the defined types were placed in this encounter.    Return in about 4 weeks (around 11/23/2021) for f/u HTN ret OD, DFE, OCT.  There are no Patient Instructions on file for this visit.  Explained the diagnoses, plan, and follow up with the patient and they expressed understanding.  Patient expressed understanding of the  importance of proper follow up care.   This document serves as a record of services personally performed by Gardiner Sleeper, MD, PhD. It was created on their behalf by Orvan Falconer, an ophthalmic technician. The creation of this record is the provider's dictation and/or activities during the  visit.    Electronically signed by: Orvan Falconer, OA, 10/26/21  8:46 AM  This document serves as a record of services personally performed by Gardiner Sleeper, MD, PhD. It was created on their behalf by San Jetty. Owens Shark, OA an ophthalmic technician. The creation of this record is the provider's dictation and/or activities during the visit.    Electronically signed by: San Jetty. Owens Shark, [place] 05.24.2023 8:46 AM   Gardiner Sleeper, M.D., Ph.D. Diseases & Surgery of the Retina and Vitreous [facility]  I have reviewed the above documentation for accuracy and completeness, and I agree with the above. Gardiner Sleeper, M.D., Ph.D. 10/26/21 8:52 AM   Abbreviations: M myopia (nearsighted); A astigmatism; H hyperopia (farsighted); P presbyopia; Mrx spectacle prescription;  CTL contact lenses; OD right eye; OS left eye; OU both eyes  XT exotropia; ET esotropia; PEK punctate epithelial keratitis; PEE punctate epithelial erosions; DES dry eye syndrome; MGD meibomian gland dysfunction; ATs artificial tears; PFAT's preservative free artificial tears; [place] nuclear sclerotic cataract; PSC posterior subcapsular cataract; ERM epi-retinal membrane; PVD posterior vitreous detachment; RD retinal detachment; DM diabetes mellitus; DR diabetic retinopathy; NPDR non-proliferative diabetic retinopathy; PDR proliferative diabetic retinopathy; CSME clinically significant macular edema; DME diabetic macular edema; dbh dot blot hemorrhages; CWS cotton wool spot; POAG primary open angle glaucoma; C/D cup-to-disc ratio; HVF humphrey visual field; GVF goldmann visual field; OCT optical coherence tomography; IOP intraocular pressure; BRVO Branch retinal vein occlusion; CRVO central retinal vein occlusion; CRAO central retinal artery occlusion; BRAO branch retinal artery occlusion; RT retinal tear; SB scleral buckle; PPV pars plana vitrectomy; VH Vitreous hemorrhage; PRP panretinal laser photocoagulation; IVK  intravitreal kenalog; VMT vitreomacular traction; MH Macular hole;  NVD neovascularization of the disc; NVE neovascularization elsewhere; AREDS age related eye disease study; ARMD age related macular degeneration; POAG primary open angle glaucoma; EBMD epithelial/anterior basement membrane dystrophy; ACIOL anterior chamber intraocular lens; IOL intraocular lens; PCIOL posterior chamber intraocular lens; Phaco/IOL phacoemulsification with intraocular lens placement; [place] photorefractive keratectomy; LASIK laser assisted in situ keratomileusis; HTN hypertension; DM diabetes mellitus; COPD chronic obstructive pulmonary disease

## 2021-10-26 ENCOUNTER — Encounter (INDEPENDENT_AMBULATORY_CARE_PROVIDER_SITE_OTHER): Payer: Self-pay | Admitting: Ophthalmology

## 2021-10-26 ENCOUNTER — Ambulatory Visit (INDEPENDENT_AMBULATORY_CARE_PROVIDER_SITE_OTHER): Payer: Medicare Other | Admitting: Ophthalmology

## 2021-10-26 DIAGNOSIS — H268 Other specified cataract: Secondary | ICD-10-CM

## 2021-10-26 DIAGNOSIS — I10 Essential (primary) hypertension: Secondary | ICD-10-CM | POA: Diagnosis not present

## 2021-10-26 DIAGNOSIS — H40053 Ocular hypertension, bilateral: Secondary | ICD-10-CM | POA: Diagnosis not present

## 2021-10-26 DIAGNOSIS — H35033 Hypertensive retinopathy, bilateral: Secondary | ICD-10-CM | POA: Diagnosis not present

## 2021-10-26 DIAGNOSIS — H25813 Combined forms of age-related cataract, bilateral: Secondary | ICD-10-CM

## 2021-10-26 DIAGNOSIS — H34831 Tributary (branch) retinal vein occlusion, right eye, with macular edema: Secondary | ICD-10-CM

## 2021-10-26 DIAGNOSIS — H3563 Retinal hemorrhage, bilateral: Secondary | ICD-10-CM

## 2021-10-26 MED ORDER — AFLIBERCEPT 2MG/0.05ML IZ SOLN FOR KALEIDOSCOPE
2.0000 mg | INTRAVITREAL | Status: AC | PRN
  Administered 2021-10-26: 2 mg via INTRAVITREAL

## 2021-11-15 NOTE — Progress Notes (Signed)
[facility] Note  11/23/2021     CHIEF COMPLAINT Patient presents for Retina Follow Up  HISTORY OF PRESENT ILLNESS: Billy Harrell is a 71 y.o. male who presents to the clinic today for:   HPI     Retina Follow Up   Patient presents with  Other.  In both eyes.  Severity is moderate.  Duration of 4 weeks.  Since onset it is stable.  I, the attending physician,  performed the HPI with the patient and updated documentation appropriately.        Comments   Pt here for 4 wk ret f/u for HTN RET OU. Pt states VA the same, doing well. No changes. Pt reports taking BRIM BID OU.       Last edited by Bernarda Caffey, MD on 11/23/2021  8:39 AM.     Referring physician: Lawerance Cruel, MD [place],  [place] [address]  HISTORICAL INFORMATION:   Selected notes from the MEDICAL RECORD NUMBER Referred by Dr. Parke Simmers for retina eval   CURRENT MEDICATIONS: Current Outpatient Medications (Ophthalmic Drugs)  Medication Sig   dorzolamide-timolol (COSOPT) 22.3-6.8 MG/ML ophthalmic solution Place 1 drop into both eyes 2 (two) times daily.   brimonidine (ALPHAGAN) 0.2 % ophthalmic solution Place 1 drop into both eyes 2 (two) times daily. (Patient not taking: Reported on 05/10/2021)   latanoprost (XALATAN) 0.005 % ophthalmic solution Place 1 drop into both eyes at bedtime. (Patient not taking: Reported on 08/31/2021)   No current facility-administered medications for this visit. (Ophthalmic Drugs)   Current Outpatient Medications (Other)  Medication Sig   acetaminophen (TYLENOL) 500 MG tablet Take 500 mg by mouth as needed for mild pain.    amLODipine (NORVASC) 5 MG tablet Take 1 tablet by mouth once daily   apixaban (ELIQUIS) 5 MG TABS tablet Take 1 tablet by mouth twice daily.   carvedilol (COREG) 6.25 MG tablet Take 1 tablet by mouth twice daily   fluticasone (FLONASE) 50 MCG/ACT nasal spray 1 spray in each nostril   Multiple Vitamin  (MULTI-VITAMIN PO) Take 1 tablet by mouth daily.   Omega-3 Fatty Acids (FISH OIL) 1000 MG CAPS Take 1,000 mg by mouth as needed (for added supplement).    tamsulosin (FLOMAX) 0.4 MG CAPS capsule Take 0.4 mg by mouth daily.   No current facility-administered medications for this visit. (Other)   REVIEW OF SYSTEMS: ROS   Positive for: Genitourinary, Endocrine, Cardiovascular, Eyes Negative for: Constitutional, Gastrointestinal, Neurological, Skin, Musculoskeletal, HENT, Respiratory, Psychiatric, Allergic/Imm, Heme/Lymph Last edited by Kingsley Spittle, COT on 11/23/2021  8:28 AM.     ALLERGIES No Known Allergies  PAST MEDICAL HISTORY Past Medical History:  Diagnosis Date   Cataract    Mixed form OU   Chronic kidney disease    ACE inhibitor and HCTZ DCd in past due to rising Creatinine   Hypertension    [place] 6/19:  neg for RA stenosis bilat; normal mesenteric arteries   Hypertensive retinopathy    OU   Permanent atrial fibrillation    s/p DCCV 04/2017 >> failed // CHADS2-VASc=2 >> Apixaban Rx   Past Surgical History:  Procedure Laterality Date   CARDIOVERSION N/A 04/30/2017   Procedure: CARDIOVERSION;  Surgeon: Josue Hector, MD;  Location: [facility];  Service: Cardiovascular;  Laterality: N/A;   FAMILY HISTORY History reviewed. No pertinent family history.  SOCIAL HISTORY Social History   Tobacco Use   Smoking status: Never   Smokeless  tobacco: Never  Vaping Use   Vaping Use: Never used  Substance Use Topics   Alcohol use: No   Drug use: No       OPHTHALMIC EXAM:  Base Eye Exam     Visual Acuity (Snellen - Linear)       Right Left   Dist [place] 20/25 -2 20/20 -2   Dist ph [place] NI          Tonometry (Tonopen, 8:32 AM)       Right Left   Pressure 16 17         Pupils       Dark Light Shape React APD   Right 3 2 Round Minimal None   Left 3 2 Round Minimal None         Visual Fields (Counting fingers)       Left Right    Full Full          Extraocular Movement       Right Left    Full, Ortho Full, Ortho         Neuro/Psych     Oriented x3: Yes   Mood/Affect: Normal         Dilation     Both eyes: 1.0% Mydriacyl, 2.5% Phenylephrine @ 8:33 AM           Slit Lamp and Fundus Exam     Slit Lamp Exam       Right Left   Lids/Lashes Dermatochalasis - upper lid, Meibomian gland dysfunction Dermatochalasis - upper lid, Meibomian gland dysfunction   Conjunctiva/Sclera Mild Melanosis Mild Melanosis   Cornea 1-2+Punctate epithelial erosions, mild Debris in tear film Trace tear film debris, 1+ Punctate epithelial erosions   Anterior Chamber Deep, 1+ pigment Deep and quiet   Iris Round and moderately dilated, pigment clump at 0800 midzone Round and moderately dilated   Lens 2-3+ Nuclear sclerosis, 2-3+ Cortical cataract, Pseudoexfoliative material on anterior capsule 2+ Nuclear sclerosis, 2-3+ Cortical cataract, early pseudoexfoliation   Anterior Vitreous Mild Vitreous syneresis, Posterior vitreous detachment Mild Vitreous syneresis, vitreous condensations         Fundus Exam       Right Left   Disc Pink and Sharp, +cupping Pink and Sharp, +cupping   C/D Ratio 0.7 0.6   Macula Flat, good foveal reflex, central IRH - improved, cystic changes centrally - improved, RPE mottling and clumping, no discreet targets for focal laser Flat, good foveal reflex, mild RPE mottling and clumping, rare MA, no edema   Vessels attenuated, Tortuous mild attenuation, mild tortuosity   Periphery Attached, No heme  Attached, No heme           IMAGING AND PROCEDURES  Imaging and Procedures for $RemoveBefore'@TODAY'[id number]$ @  OCT, Retina - OU - Both Eyes       Right Eye Quality was good. Central Foveal Thickness: 254. Progression has improved. Findings include no SRF, abnormal foveal contour, intraretinal hyper-reflective material, intraretinal fluid (interval improvement in IRF/cystic changes and IRHM nasal fovea -- just trace cystic changes  remain).   Left Eye Quality was good. Central Foveal Thickness: 265. Progression has been stable. Findings include normal foveal contour, no IRF, no SRF (Trace cyst IN to fovea).   Notes *Images captured and stored on drive  Diagnosis / Impression:  OD: interval improvement in IRF/cystic changes and IRHM nasal fovea -- just trace cystic changes remain OS: NFP, no IRF/SRF -- Trace cyst IN to fovea  Clinical management:  See below  Abbreviations: NFP - Normal foveal profile. CME - cystoid macular edema. PED - pigment epithelial detachment. IRF - intraretinal fluid. SRF - subretinal fluid. EZ - ellipsoid zone. ERM - epiretinal membrane. ORA - outer retinal atrophy. ORT - outer retinal tubulation. SRHM - subretinal hyper-reflective material      Intravitreal Injection, Pharmacologic Agent - OD - Right Eye       Time Out 11/23/2021. 9:02 AM. Confirmed correct patient, procedure, site, and patient consented.   Anesthesia Topical anesthesia was used. Anesthetic medications included Lidocaine 2%, Proparacaine 0.5%.   Procedure Preparation included 5% betadine to ocular surface, eyelid speculum. A (32g) needle was used.   Injection: 2 mg aflibercept 2 MG/0.05ML   Route: Intravitreal, Site: Right Eye   NDC: [id number], Lot: [id number], Expiration date: 09/03/2022, Waste: 0 mL   Post-op Post injection exam found visual acuity of at least counting fingers. The patient tolerated the procedure well. There were no complications. The patient received written and verbal post procedure care education. Post injection medications were not given.            ASSESSMENT/PLAN:   ICD-10-CM   1. Essential hypertension  I10     2. Hypertensive retinopathy of both eyes  H35.033     3. Retinal hemorrhage of both eyes  H35.63     4. Branch retinal vein occlusion of right eye with macular edema  H34.8310 OCT, Retina - OU - Both Eyes    Intravitreal Injection, Pharmacologic Agent - OD - Right  Eye    aflibercept (EYLEA) SOLN 2 mg    5. Combined forms of age-related cataract of both eyes  H25.813     6. 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 Hypertensive retinopathy with retinal hemorrhage OU and retinal edema OD  - IVA OD #1 (10.11.22), #2 (11.08.22), #3 (12.06.22), #4 (1.3.23) -- IVA resistance  - s/p IVE OD #1 (sample) 01.31.23, #2 (03.01.23), #3 (03.29.23), #4 (04.20.23), #5 (05.24.23)  - original OCT showed focal IRF/IRHM OD; OS with mild IRHM, no edema  - FA (01.14.21) shows focal, parafoveal hyperfluorescent leakage OU (OD >> OS)  - repeat FA 1.11.22 showed mild improvement in perifoveal leakage OD  - BP at initial visit 140s / 99 and 96 in R and L arms respectively  - suspect hemorrhage and edema related to HTN and use of eliquis for A fib  - differential includes mild, old BRVO w/ CME OD -- very similar in appearance to DME, but pt does not carry diagnosis of DM  - BP meds adjusted and BP improved  - exam shows interval improvement in IRF/cystic changes greatest nasally and BCVA OD stable at 20/25   - OCT shows OD: interval improvement in IRF/cystic changes and IRHM nasal fovea  -- just trace cystic changes remain; OS: Trace cyst IN to fovea at 4 weeks  - pt's daughter reports development of macular edema around same time pt started latanoprost  - ?PGA-induced edema  - stopped latanoprost and started brimonidine BID OU -- no significant improvement in edema  - stopped PF and Prolensa  - recommend IVE OD #6 today, 06.21.23 with follow up extended to 5 weeks          - pt wishes to proceed with injection  - RBA of procedure discussed, questions answered - IVA informed consent obtained and signed, 10.11.22 (OD) - IVE informed consent obtained and  signed, 01.31.23 (OD) - see procedure note - f/u 5 weeks, sooner prn -- DFE/OCT, possible injection  5. Mixed form age related cataract OU  - The symptoms  of cataract, surgical options, and treatments and risks were discussed with patient.  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 Ocular hypertension OU  - IOP 16,17 today  - started on Latanoprost by Dr. Parke Simmers  - edema OD started about the same time per daughter  - switched from [place] to brim due to possible macular edema from latan  - continue brim BID OU  Ophthalmic Meds Ordered this visit:  Meds ordered this encounter  Medications   aflibercept (EYLEA) SOLN 2 mg     Return in about 5 weeks (around 12/28/2021) for f/u HTN ret OD, DFE, OCT.  There are no Patient Instructions on file for this visit.  Explained the diagnoses, plan, and follow up with the patient and they expressed understanding.  Patient expressed understanding of the importance of proper follow up care.   This document serves as a record of services personally performed by Gardiner Sleeper, MD, PhD. It was created on their behalf by Orvan Falconer, an ophthalmic technician. The creation of this record is the provider's dictation and/or activities during the visit.    Electronically signed by: Orvan Falconer, OA, 11/23/21  12:58 PM  This document serves as a record of services personally performed by Gardiner Sleeper, MD, PhD. It was created on their behalf by San Jetty. Owens Shark, OA an ophthalmic technician. The creation of this record is the provider's dictation and/or activities during the visit.    Electronically signed by: San Jetty. Owens Shark, [place] 06.21.2023 12:58 PM  Gardiner Sleeper, M.D., Ph.D. Diseases & Surgery of the Retina and Vitreous [facility]  I have reviewed the above documentation for accuracy and completeness, and I agree with the above. Gardiner Sleeper, M.D., Ph.D. 11/23/21 1:00  PM   Abbreviations: M myopia (nearsighted); A astigmatism; H hyperopia (farsighted); P presbyopia; Mrx spectacle prescription;  CTL contact lenses; OD right eye; OS left eye; OU both eyes  XT exotropia; ET esotropia; PEK punctate epithelial keratitis; PEE punctate epithelial erosions; DES dry eye syndrome; MGD meibomian gland dysfunction; ATs artificial tears; PFAT's preservative free artificial tears; [place] nuclear sclerotic cataract; PSC posterior subcapsular cataract; ERM epi-retinal membrane; PVD posterior vitreous detachment; RD retinal detachment; DM diabetes mellitus; DR diabetic retinopathy; NPDR non-proliferative diabetic retinopathy; PDR proliferative diabetic retinopathy; CSME clinically significant macular edema; DME diabetic macular edema; dbh dot blot hemorrhages; CWS cotton wool spot; POAG primary open angle glaucoma; C/D cup-to-disc ratio; HVF humphrey visual field; GVF goldmann visual field; OCT optical coherence tomography; IOP intraocular pressure; BRVO Branch retinal vein occlusion; CRVO central retinal vein occlusion; CRAO central retinal artery occlusion; BRAO branch retinal artery occlusion; RT retinal tear; SB scleral buckle; PPV pars plana vitrectomy; VH Vitreous hemorrhage; PRP panretinal laser photocoagulation; IVK intravitreal kenalog; VMT vitreomacular traction; MH Macular hole;  NVD neovascularization of the disc; NVE neovascularization elsewhere; AREDS age related eye disease study; ARMD age related macular degeneration; POAG primary open angle glaucoma; EBMD epithelial/anterior basement membrane dystrophy; ACIOL anterior chamber intraocular lens; IOL intraocular lens; PCIOL posterior chamber intraocular lens; Phaco/IOL phacoemulsification with intraocular lens placement; PRK photorefractive keratectomy; LASIK laser assisted in situ keratomileusis; HTN  hypertension; DM diabetes mellitus; COPD chronic obstructive pulmonary disease

## 2021-11-16 DIAGNOSIS — H401412 Capsular glaucoma with pseudoexfoliation of lens, right eye, moderate stage: Secondary | ICD-10-CM | POA: Diagnosis not present

## 2021-11-16 DIAGNOSIS — H401421 Capsular glaucoma with pseudoexfoliation of lens, left eye, mild stage: Secondary | ICD-10-CM | POA: Diagnosis not present

## 2021-11-23 ENCOUNTER — Ambulatory Visit (INDEPENDENT_AMBULATORY_CARE_PROVIDER_SITE_OTHER): Payer: Medicare Other | Admitting: Ophthalmology

## 2021-11-23 ENCOUNTER — Encounter (INDEPENDENT_AMBULATORY_CARE_PROVIDER_SITE_OTHER): Payer: Self-pay | Admitting: Ophthalmology

## 2021-11-23 DIAGNOSIS — H35033 Hypertensive retinopathy, bilateral: Secondary | ICD-10-CM

## 2021-11-23 DIAGNOSIS — I10 Essential (primary) hypertension: Secondary | ICD-10-CM | POA: Diagnosis not present

## 2021-11-23 DIAGNOSIS — H34831 Tributary (branch) retinal vein occlusion, right eye, with macular edema: Secondary | ICD-10-CM | POA: Diagnosis not present

## 2021-11-23 DIAGNOSIS — H40053 Ocular hypertension, bilateral: Secondary | ICD-10-CM

## 2021-11-23 DIAGNOSIS — H268 Other specified cataract: Secondary | ICD-10-CM

## 2021-11-23 DIAGNOSIS — H3563 Retinal hemorrhage, bilateral: Secondary | ICD-10-CM

## 2021-11-23 DIAGNOSIS — H25813 Combined forms of age-related cataract, bilateral: Secondary | ICD-10-CM | POA: Diagnosis not present

## 2021-11-23 MED ORDER — AFLIBERCEPT 2MG/0.05ML IZ SOLN FOR KALEIDOSCOPE
2.0000 mg | INTRAVITREAL | Status: AC | PRN
  Administered 2021-11-23: 2 mg via INTRAVITREAL

## 2021-12-20 NOTE — Progress Notes (Addendum)
[facility] Note  12/28/2021     CHIEF COMPLAINT Patient presents for Retina Follow Up  HISTORY OF PRESENT ILLNESS: Billy Harrell is a 71 y.o. male who presents to the clinic today for:   HPI     Retina Follow Up   Patient presents with  Other.  In right eye.  This started 5 weeks ago.  I, the attending physician,  performed the HPI with the patient and updated documentation appropriately.        Comments   Patient here for 5 weeks retina follow up for HTN Ret OU/edema OD. Patient states vision doing ok. No eye pain.       Last edited by Bernarda Caffey, MD on 12/28/2021  8:57 AM.     Patient states he can not see a change in his vision.   Referring physician: Lawerance Cruel, MD [place],  [place] [id number]  HISTORICAL INFORMATION:   Selected notes from the MEDICAL RECORD NUMBER Referred by Dr. Parke Simmers for retina eval   CURRENT MEDICATIONS: Current Outpatient Medications (Ophthalmic Drugs)  Medication Sig   dorzolamide-timolol (COSOPT) 22.3-6.8 MG/ML ophthalmic solution Place 1 drop into both eyes 2 (two) times daily.   brimonidine (ALPHAGAN) 0.2 % ophthalmic solution Place 1 drop into both eyes 2 (two) times daily. (Patient not taking: Reported on 05/10/2021)   latanoprost (XALATAN) 0.005 % ophthalmic solution Place 1 drop into both eyes at bedtime. (Patient not taking: Reported on 08/31/2021)   No current facility-administered medications for this visit. (Ophthalmic Drugs)   Current Outpatient Medications (Other)  Medication Sig   acetaminophen (TYLENOL) 500 MG tablet Take 500 mg by mouth as needed for mild pain.    amLODipine (NORVASC) 5 MG tablet Take 1 tablet by mouth once daily   apixaban (ELIQUIS) 5 MG TABS tablet Take 1 tablet by mouth twice daily.   carvedilol (COREG) 6.25 MG tablet Take 1 tablet by mouth twice daily   fluticasone (FLONASE) 50 MCG/ACT nasal spray 1 spray in each nostril   Multiple Vitamin  (MULTI-VITAMIN PO) Take 1 tablet by mouth daily.   Omega-3 Fatty Acids (FISH OIL) 1000 MG CAPS Take 1,000 mg by mouth as needed (for added supplement).    tamsulosin (FLOMAX) 0.4 MG CAPS capsule Take 0.4 mg by mouth daily.   No current facility-administered medications for this visit. (Other)   REVIEW OF SYSTEMS: ROS   Positive for: Genitourinary, Endocrine, Cardiovascular, Eyes Negative for: Constitutional, Gastrointestinal, Neurological, Skin, Musculoskeletal, HENT, Respiratory, Psychiatric, Allergic/Imm, Heme/Lymph Last edited by Theodore Demark, COA on 12/28/2021  8:01 AM.     ALLERGIES No Known Allergies  PAST MEDICAL HISTORY Past Medical History:  Diagnosis Date   Cataract    Mixed form OU   Chronic kidney disease    ACE inhibitor and HCTZ DCd in past due to rising Creatinine   Hypertension    [place] 6/19:  neg for RA stenosis bilat; normal mesenteric arteries   Hypertensive retinopathy    OU   Permanent atrial fibrillation    s/p DCCV 04/2017 >> failed // CHADS2-VASc=2 >> Apixaban Rx   Past Surgical History:  Procedure Laterality Date   CARDIOVERSION N/A 04/30/2017   Procedure: CARDIOVERSION;  Surgeon: Josue Hector, MD;  Location: [facility];  Service: Cardiovascular;  Laterality: N/A;   FAMILY HISTORY History reviewed. No pertinent family history.  SOCIAL HISTORY Social History   Tobacco Use   Smoking status: Never   Smokeless tobacco:  Never  Vaping Use   Vaping Use: Never used  Substance Use Topics   Alcohol use: No   Drug use: No       OPHTHALMIC EXAM:  Base Eye Exam     Visual Acuity (Snellen - Linear)       Right Left   Dist [place] 20/25 -2 20/20 -1   Dist ph [place] 20/20          Tonometry (Tonopen, 7:59 AM)       Right Left   Pressure 17 16         Pupils       Dark Light Shape React APD   Right 3 2 Round Minimal None   Left 3 2 Round Minimal None         Visual Fields (Counting fingers)       Left Right    Full Full          Extraocular Movement       Right Left    Full, Ortho Full, Ortho         Neuro/Psych     Oriented x3: Yes   Mood/Affect: Normal         Dilation     Both eyes: 1.0% Mydriacyl, 2.5% Phenylephrine @ 7:59 AM           Slit Lamp and Fundus Exam     Slit Lamp Exam       Right Left   Lids/Lashes Dermatochalasis - upper lid, Meibomian gland dysfunction Dermatochalasis - upper lid, Meibomian gland dysfunction   Conjunctiva/Sclera Mild Melanosis Mild Melanosis   Cornea 1-2+Punctate epithelial erosions, mild Debris in tear film Trace tear film debris, 1+ Punctate epithelial erosions   Anterior Chamber Deep, 1+ pigment Deep and quiet   Iris Round and moderately dilated, pigment clump at 0800 midzone Round and moderately dilated   Lens 2-3+ Nuclear sclerosis, 2-3+ Cortical cataract, Pseudoexfoliative material on anterior capsule 2+ Nuclear sclerosis, 2-3+ Cortical cataract, early pseudoexfoliation   Anterior Vitreous Mild Vitreous syneresis, Posterior vitreous detachment Mild Vitreous syneresis, vitreous condensations         Fundus Exam       Right Left   Disc Pink and Sharp, +cupping Pink and Sharp, +cupping   C/D Ratio 0.7 0.6   Macula Flat, good foveal reflex, central IRH - improved, cystic changes centrally - improved, RPE mottling and clumping, no discreet targets for focal laser Flat, good foveal reflex, mild RPE mottling and clumping, rare MA, focal cystic changes IN and mac   Vessels attenuated, Tortuous mild attenuation, mild tortuosity   Periphery Attached, No heme Attached, No heme           Refraction     Wearing Rx       Sphere Cylinder Axis   Right [place] +0.50 170   Left -0.75 +0.75 170           IMAGING AND PROCEDURES  Imaging and Procedures for _0 @  OCT, Retina - OU - Both Eyes       Right Eye Quality was good. Central Foveal Thickness: 253. Progression has improved. Findings include normal foveal contour, no IRF, no SRF,  intraretinal hyper-reflective material, intraretinal fluid (interval improvement in IRF/cystic changes and IRHM nasal fovea -- just trace cystic changes remain).   Left Eye Quality was good. Central Foveal Thickness: 273. Progression has worsened. Findings include no SRF, abnormal foveal contour (Interval development of IRF nasal fovea).   Notes *Images captured and  stored on drive  Diagnosis / Impression:  OD: interval improvement in IRF/cystic changes and IRHM nasal fovea -- just trace cystic changes remain OS: NFP, Interval development in IRF in nasal fovea  Clinical management:  See below  Abbreviations: NFP - Normal foveal profile. CME - cystoid macular edema. PED - pigment epithelial detachment. IRF - intraretinal fluid. SRF - subretinal fluid. EZ - ellipsoid zone. ERM - epiretinal membrane. ORA - outer retinal atrophy. ORT - outer retinal tubulation. SRHM - subretinal hyper-reflective material      Intravitreal Injection, Pharmacologic Agent - OD - Right Eye       Time Out 12/28/2021. 8:28 AM. Confirmed correct patient, procedure, site, and patient consented.   Anesthesia Topical anesthesia was used. Anesthetic medications included Lidocaine 2%, Proparacaine 0.5%.   Procedure Preparation included 5% betadine to ocular surface, eyelid speculum. A (32g) needle was used.   Injection: 2 mg aflibercept 2 MG/0.05ML   Route: Intravitreal, Site: Right Eye   NDC: [id number], Lot: [id number], Expiration date: 10/03/2022, Waste: 0 mL   Post-op Post injection exam found visual acuity of at least counting fingers. The patient tolerated the procedure well. There were no complications. The patient received written and verbal post procedure care education. Post injection medications were not given.            ASSESSMENT/PLAN:   ICD-10-CM   1. Essential hypertension  I10     2. Hypertensive retinopathy of both eyes  H35.033 OCT, Retina - OU - Both Eyes    3. Retinal  hemorrhage of both eyes  H35.63     4. Branch retinal vein occlusion of right eye with macular edema  H34.8310 Intravitreal Injection, Pharmacologic Agent - OD - Right Eye    aflibercept (EYLEA) SOLN 2 mg    5. Combined forms of age-related cataract of both eyes  H25.813     6. 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 Hypertensive retinopathy with retinal hemorrhage OU and retinal edema OD  - IVA OD #1 (10.11.22), #2 (11.08.22), #3 (12.06.22), #4 (1.3.23) -- IVA resistance  - s/p IVE OD #1 (sample) 01.31.23, #2 (03.01.23), #3 (03.29.23), #4 (04.20.23), #5 (05.24.23), #6 (06.21.23)  - original OCT showed focal IRF/IRHM OD; OS with mild IRHM, no edema  - FA (01.14.21) shows focal, parafoveal hyperfluorescent leakage OU (OD >> OS)  - repeat FA 1.11.22 showed mild improvement in perifoveal leakage OD  - BP at initial visit 140s / 99 and 96 in R and L arms respectively  - suspect hemorrhage and edema related to HTN and use of eliquis for A fib  - differential includes mild, old BRVO w/ CME OD -- very similar in appearance to DME, but pt does not carry diagnosis of DM  - BP meds adjusted and BP improved  - exam shows interval improvement in IRF/cystic changes greatest nasally and BCVA OD stable at 20/25   - OCT shows OD: interval improvement in IRF/cystic changes and IRHM nasal fovea  -- just trace cystic changes remain; OS: Interval development in IRF in nasal fovea at 5 weeks. May need to begin treatment in the left eye in the future  - pt's daughter reports development of macular edema around same time pt started latanoprost  - ?PGA-induced edema  - stopped latanoprost and started brimonidine BID OU -- no significant improvement in edema  - stopped PF and Prolensa  -  recommend IVE OD #7 today, 07.26.23 with follow up extended to 6 weeks          - pt wishes to proceed with injection  - RBA of procedure discussed, questions  answered - IVA informed consent obtained and signed, 10.11.22 (OD) - IVE informed consent obtained and re-signed, 01.31.23 (OD) - see procedure note - f/u 6 weeks, sooner prn -- DFE/OCT, possible injection  5. 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 Ocular hypertension OU  - IOP 17, 16 today  - started on Latanoprost by Dr. Parke Simmers  - edema OD started about the same time per daughter  - switched from [place] to brim due to possible macular edema from latan  - continue Brimonidine BID OU  Ophthalmic Meds Ordered this visit:  Meds ordered this encounter  Medications   aflibercept (EYLEA) SOLN 2 mg     Return in about 6 weeks (around 02/08/2022) for HTN RET OU, DFE, OCT, Possible Injxn.  There are no Patient Instructions on file for this visit.  Explained the diagnoses, plan, and follow up with the patient and they expressed understanding.  Patient expressed understanding of the importance of proper follow up care.   This document serves as a record of services personally performed by Gardiner Sleeper, MD, PhD. It was created on their behalf by Orvan Falconer, an ophthalmic technician. The creation of this record is the provider's dictation and/or activities during the visit.    Electronically signed by: Orvan Falconer, OA, 12/28/21  9:00 AM  This document serves as a record of services personally performed by Gardiner Sleeper, MD, PhD. It was created on their behalf by Renaldo Reel, [place] an ophthalmic technician. The creation of this record is the provider's dictation and/or activities during the visit.    Electronically signed by:  Renaldo Reel, COT   12/28/21 9:00 AM  Gardiner Sleeper, M.D., Ph.D. Diseases & Surgery of the Retina and Vitreous [facility]  I have reviewed the above documentation for accuracy and completeness, and I agree with the above. Gardiner Sleeper, M.D., Ph.D. 12/28/21 9:01 AM   Abbreviations: M myopia (nearsighted); A astigmatism; H hyperopia (farsighted); P presbyopia; Mrx spectacle prescription;  CTL contact lenses; OD right eye; OS left eye; OU both eyes  XT exotropia; ET esotropia; PEK punctate epithelial keratitis; PEE punctate epithelial erosions; DES dry eye syndrome; MGD meibomian gland dysfunction; ATs artificial tears; PFAT's preservative free artificial tears; [place] nuclear sclerotic cataract; PSC posterior subcapsular cataract; ERM epi-retinal membrane; PVD posterior vitreous detachment; RD retinal detachment; DM diabetes mellitus; DR diabetic retinopathy; NPDR non-proliferative diabetic retinopathy; PDR proliferative diabetic retinopathy; CSME clinically significant macular edema; DME diabetic macular edema; dbh dot blot hemorrhages; CWS cotton wool spot; POAG primary open angle glaucoma; C/D cup-to-disc ratio; HVF humphrey visual field; GVF goldmann visual field; OCT optical coherence tomography; IOP intraocular pressure; BRVO Branch retinal vein occlusion; CRVO central retinal vein occlusion; CRAO central retinal artery occlusion; BRAO branch retinal artery occlusion; RT retinal tear; SB scleral buckle; PPV pars plana vitrectomy; VH Vitreous hemorrhage; PRP panretinal laser photocoagulation; IVK intravitreal kenalog; VMT vitreomacular traction; MH Macular hole;  NVD neovascularization of the  disc; NVE neovascularization elsewhere; AREDS age related eye disease study; ARMD age related macular degeneration; POAG primary open angle glaucoma; EBMD epithelial/anterior basement membrane dystrophy; ACIOL anterior chamber intraocular lens; IOL intraocular lens; PCIOL posterior chamber intraocular lens;  Phaco/IOL phacoemulsification with intraocular lens placement; [place] photorefractive keratectomy; LASIK laser assisted in situ keratomileusis; HTN hypertension; DM diabetes mellitus; COPD chronic obstructive pulmonary disease

## 2021-12-28 ENCOUNTER — Encounter (INDEPENDENT_AMBULATORY_CARE_PROVIDER_SITE_OTHER): Payer: Self-pay | Admitting: Ophthalmology

## 2021-12-28 ENCOUNTER — Ambulatory Visit (INDEPENDENT_AMBULATORY_CARE_PROVIDER_SITE_OTHER): Payer: Medicare Other | Admitting: Ophthalmology

## 2021-12-28 DIAGNOSIS — H40053 Ocular hypertension, bilateral: Secondary | ICD-10-CM

## 2021-12-28 DIAGNOSIS — H34831 Tributary (branch) retinal vein occlusion, right eye, with macular edema: Secondary | ICD-10-CM | POA: Diagnosis not present

## 2021-12-28 DIAGNOSIS — H25813 Combined forms of age-related cataract, bilateral: Secondary | ICD-10-CM | POA: Diagnosis not present

## 2021-12-28 DIAGNOSIS — H3563 Retinal hemorrhage, bilateral: Secondary | ICD-10-CM

## 2021-12-28 DIAGNOSIS — H268 Other specified cataract: Secondary | ICD-10-CM

## 2021-12-28 DIAGNOSIS — H35033 Hypertensive retinopathy, bilateral: Secondary | ICD-10-CM | POA: Diagnosis not present

## 2021-12-28 DIAGNOSIS — I10 Essential (primary) hypertension: Secondary | ICD-10-CM

## 2021-12-28 MED ORDER — AFLIBERCEPT 2MG/0.05ML IZ SOLN FOR KALEIDOSCOPE
2.0000 mg | INTRAVITREAL | Status: AC | PRN
  Administered 2021-12-28: 2 mg via INTRAVITREAL

## 2022-02-08 ENCOUNTER — Encounter (INDEPENDENT_AMBULATORY_CARE_PROVIDER_SITE_OTHER): Payer: Medicare Other | Admitting: Ophthalmology

## 2022-02-09 NOTE — Progress Notes (Signed)
[facility] Note  02/15/2022     CHIEF COMPLAINT Patient presents for Retina Follow Up  HISTORY OF PRESENT ILLNESS: Billy Harrell is a 71 y.o. male who presents to the clinic today for:   HPI     Retina Follow Up   Patient presents with  Other.  In right eye.  This started months ago.  Duration of 7 weeks.  I, the attending physician,  performed the HPI with the patient and updated documentation appropriately.        Comments   Patient states that the eyes are so-so. He feels that its hard to read the small print.       Last edited by Bernarda Caffey, MD on 02/15/2022 12:43 PM.     Referring physician: Lawerance Cruel, MD [place],  [place] [id number]  HISTORICAL INFORMATION:   Selected notes from the MEDICAL RECORD NUMBER Referred by Dr. Parke Simmers for retina eval   CURRENT MEDICATIONS: Current Outpatient Medications (Ophthalmic Drugs)  Medication Sig   brimonidine (ALPHAGAN) 0.2 % ophthalmic solution Place 1 drop into both eyes 2 (two) times daily. (Patient not taking: Reported on 05/10/2021)   dorzolamide-timolol (COSOPT) 22.3-6.8 MG/ML ophthalmic solution Place 1 drop into both eyes 2 (two) times daily.   latanoprost (XALATAN) 0.005 % ophthalmic solution Place 1 drop into both eyes at bedtime. (Patient not taking: Reported on 08/31/2021)   No current facility-administered medications for this visit. (Ophthalmic Drugs)   Current Outpatient Medications (Other)  Medication Sig   acetaminophen (TYLENOL) 500 MG tablet Take 500 mg by mouth as needed for mild pain.    amLODipine (NORVASC) 5 MG tablet Take 1 tablet by mouth once daily   apixaban (ELIQUIS) 5 MG TABS tablet Take 1 tablet by mouth twice daily.   carvedilol (COREG) 6.25 MG tablet Take 1 tablet by mouth twice daily   fluticasone (FLONASE) 50 MCG/ACT nasal spray 1 spray in each nostril   Multiple Vitamin (MULTI-VITAMIN PO) Take 1 tablet by mouth daily.   Omega-3 Fatty  Acids (FISH OIL) 1000 MG CAPS Take 1,000 mg by mouth as needed (for added supplement).    tamsulosin (FLOMAX) 0.4 MG CAPS capsule Take 0.4 mg by mouth daily.   No current facility-administered medications for this visit. (Other)   REVIEW OF SYSTEMS: ROS   Positive for: Genitourinary, Endocrine, Cardiovascular, Eyes Negative for: Constitutional, Gastrointestinal, Neurological, Skin, Musculoskeletal, HENT, Respiratory, Psychiatric, Allergic/Imm, Heme/Lymph Last edited by Annie Paras, COT on 02/15/2022  8:43 AM.     ALLERGIES No Known Allergies  PAST MEDICAL HISTORY Past Medical History:  Diagnosis Date   Cataract    Mixed form OU   Chronic kidney disease    ACE inhibitor and HCTZ DCd in past due to rising Creatinine   Hypertension    [place] 6/19:  neg for RA stenosis bilat; normal mesenteric arteries   Hypertensive retinopathy    OU   Permanent atrial fibrillation    s/p DCCV 04/2017 >> failed // CHADS2-VASc=2 >> Apixaban Rx   Past Surgical History:  Procedure Laterality Date   CARDIOVERSION N/A 04/30/2017   Procedure: CARDIOVERSION;  Surgeon: Josue Hector, MD;  Location: [facility];  Service: Cardiovascular;  Laterality: N/A;   FAMILY HISTORY History reviewed. No pertinent family history.  SOCIAL HISTORY Social History   Tobacco Use   Smoking status: Never   Smokeless tobacco: Never  Vaping Use   Vaping Use: Never used  Substance Use Topics  Alcohol use: No   Drug use: No       OPHTHALMIC EXAM:  Base Eye Exam     Visual Acuity (Snellen - Linear)       Right Left   Dist [place] 20/25 20/20   Dist ph [place] 20/20          Tonometry (Tonopen, 8:46 AM)       Right Left   Pressure 16 13         Pupils       Dark Light Shape React APD   Right 3 2 Round Minimal None   Left 3 2 Round Minimal None         Visual Fields       Left Right    Full Full         Extraocular Movement       Right Left    Full, Ortho Full, Ortho          Neuro/Psych     Oriented x3: Yes   Mood/Affect: Normal         Dilation     Both eyes: 1.0% Mydriacyl, 2.5% Phenylephrine @ 8:44 AM           Slit Lamp and Fundus Exam     Slit Lamp Exam       Right Left   Lids/Lashes Dermatochalasis - upper lid, Meibomian gland dysfunction Dermatochalasis - upper lid, Meibomian gland dysfunction   Conjunctiva/Sclera Mild Melanosis Mild Melanosis   Cornea 1-2+Punctate epithelial erosions, mild Debris in tear film Trace tear film debris, 1+ Punctate epithelial erosions   Anterior Chamber Deep, 1+ pigment Deep and quiet   Iris Round and moderately dilated, pigment clump at 0800 midzone Round and moderately dilated   Lens 2-3+ Nuclear sclerosis, 2-3+ Cortical cataract, Pseudoexfoliative material on anterior capsule 2+ Nuclear sclerosis, 2-3+ Cortical cataract, early pseudoexfoliation   Anterior Vitreous Mild Vitreous syneresis, Posterior vitreous detachment Mild Vitreous syneresis, vitreous condensations         Fundus Exam       Right Left   Disc Pink and Sharp, +cupping Pink and Sharp, +cupping   C/D Ratio 0.7 0.6   Macula Flat, good foveal reflex, central IRH - improved, cystic changes centrally - improved, RPE mottling and clumping, no discreet targets for focal laser Flat, good foveal reflex, mild RPE mottling and clumping, rare MA, focal cystic changes IN mac -- improved   Vessels attenuated, mild tortuosity mild attenuation, mild tortuosity   Periphery Attached, No heme Attached, No heme           Refraction     Wearing Rx       Sphere Cylinder Axis   Right [place] +0.50 170   Left -0.75 +0.75 170           IMAGING AND PROCEDURES  Imaging and Procedures for _0 @  OCT, Retina - OU - Both Eyes       Right Eye Quality was good. Central Foveal Thickness: 251. Progression has improved. Findings include normal foveal contour, no IRF, no SRF, intraretinal hyper-reflective material, intraretinal fluid (interval  improvement in IRF/cystic changes and IRHM nasal fovea -- just trace cyst remains).   Left Eye Quality was good. Central Foveal Thickness: 268. Progression has improved. Findings include no SRF, abnormal foveal contour (Interval improvement of IRF/cystic changes nasal fovea).   Notes *Images captured and stored on drive  Diagnosis / Impression:  OD: interval improvement in IRF/cystic changes and IRHM nasal fovea --  just trace cyst remains OS: NFP, Interval improvement of IRF/cystic changes nasal fovea  Clinical management:  See below  Abbreviations: NFP - Normal foveal profile. CME - cystoid macular edema. PED - pigment epithelial detachment. IRF - intraretinal fluid. SRF - subretinal fluid. EZ - ellipsoid zone. ERM - epiretinal membrane. ORA - outer retinal atrophy. ORT - outer retinal tubulation. SRHM - subretinal hyper-reflective material      Intravitreal Injection, Pharmacologic Agent - OD - Right Eye       Time Out 02/15/2022. 9:39 AM. Confirmed correct patient, procedure, site, and patient consented.   Anesthesia Topical anesthesia was used. Anesthetic medications included Lidocaine 2%, Proparacaine 0.5%.   Procedure Preparation included 5% betadine to ocular surface, eyelid speculum. A (32g) needle was used.   Injection: 2 mg aflibercept 2 MG/0.05ML   Route: Intravitreal, Site: Right Eye   NDC: [id number], Lot: [id number], Expiration date: 03/28/2022, Waste: 0 mL   Post-op Post injection exam found visual acuity of at least counting fingers. The patient tolerated the procedure well. There were no complications. The patient received written and verbal post procedure care education. Post injection medications were not given.            ASSESSMENT/PLAN:   ICD-10-CM   1. Essential hypertension  I10     2. Hypertensive retinopathy of both eyes  H35.033 OCT, Retina - OU - Both Eyes    3. Retinal hemorrhage of both eyes  H35.63     4. Branch retinal vein  occlusion of right eye with macular edema  H34.8310 Intravitreal Injection, Pharmacologic Agent - OD - Right Eye    aflibercept (EYLEA) SOLN 2 mg    5. Combined forms of age-related cataract of both eyes  H25.813     6. 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 Hypertensive retinopathy with retinal hemorrhage OU and retinal edema OD  - IVA OD #1 (10.11.22), #2 (11.08.22), #3 (12.06.22), #4 (1.3.23) -- IVA resistance  - s/p IVE OD #1 (sample) 01.31.23, #2 (03.01.23), #3 (03.29.23), #4 (04.20.23), #5 (05.24.23), #6 (06.21.23), #7 (07.26.23)  - original OCT showed focal IRF/IRHM OD; OS with mild IRHM, no edema  - FA (01.14.21) shows focal, parafoveal hyperfluorescent leakage OU (OD >> OS)  - repeat FA 1.11.22 showed mild improvement in perifoveal leakage OD  - BP at initial visit 140s / 99 and 96 in R and L arms respectively  - suspect hemorrhage and edema related to HTN and use of eliquis for A fib  - differential includes mild, old BRVO w/ CME OD -- very similar in appearance to DME, but pt does not carry diagnosis of DM  - BP meds adjusted and BP improved  - exam shows interval improvement in IRF/cystic changes greatest nasally and BCVA OD stable at 20/25   - OCT shows OD: interval improvement in IRF/cystic changes and IRHM nasal fovea -- just trace cyst remains; OS: NFP, Interval improvement of IRF/cystic changes nasal fovea at 7 weeks  - pt's daughter reports development of macular edema around same time pt started latanoprost  - ?PGA-induced edema  - stopped latanoprost and started brimonidine BID OU -- no significant improvement in edema  - stopped PF and Prolensa  - recommend IVE OD #8 today, 09.13.23 with follow up extended to 8 weeks          - pt wishes to proceed with injection  -  RBA of procedure discussed, questions answered - IVA informed consent obtained and signed, 10.11.22 (OD) - IVE informed consent  obtained and re-signed, 01.31.23 (OD) - see procedure note - f/u 8 weeks, sooner prn -- DFE/OCT, possible injection  5. 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 Ocular hypertension OU  - IOP 17, 16 today  - started on Latanoprost by Dr. Parke Simmers  - edema OD started about the same time per daughter  - switched from [place] to brim due to possible macular edema from latan  - continue brimonidine BID OU  Ophthalmic Meds Ordered this visit:  Meds ordered this encounter  Medications   aflibercept (EYLEA) SOLN 2 mg     Return in about 8 weeks (around 04/12/2022) for f/u retinal hemes OU, DFE, OCT.  There are no Patient Instructions on file for this visit.  Explained the diagnoses, plan, and follow up with the patient and they expressed understanding.  Patient expressed understanding of the importance of proper follow up care.   This document serves as a record of services personally performed by Gardiner Sleeper, MD, PhD. It was created on their behalf by Orvan Falconer, an ophthalmic technician. The creation of this record is the provider's dictation and/or activities during the visit.    Electronically signed by: Orvan Falconer, OA, 02/15/22  12:44 PM  This document serves as a record of services personally performed by Gardiner Sleeper, MD, PhD. It was created on their behalf by San Jetty. Owens Shark, OA an ophthalmic technician. The creation of this record is the provider's dictation and/or activities during the visit.    Electronically signed by: San Jetty. Marguerita Merles 09.13.2023 12:44 PM  Gardiner Sleeper, M.D., Ph.D. Diseases & Surgery of the Retina and Vitreous [facility]  I have reviewed the above documentation for accuracy and completeness, and I agree with the above. Gardiner Sleeper, M.D., Ph.D. 02/15/22 12:47 PM  Abbreviations: M myopia (nearsighted); A astigmatism; H hyperopia (farsighted); P presbyopia; Mrx spectacle prescription;  CTL contact lenses; OD right eye; OS left eye; OU both eyes  XT exotropia; ET esotropia; PEK punctate epithelial keratitis; PEE punctate epithelial erosions; DES dry eye syndrome; MGD meibomian gland dysfunction; ATs artificial tears; PFAT's preservative free artificial tears; [place] nuclear sclerotic cataract; PSC posterior subcapsular cataract; ERM epi-retinal membrane; PVD posterior vitreous detachment; RD retinal detachment; DM diabetes mellitus; DR diabetic retinopathy; NPDR non-proliferative diabetic retinopathy; PDR proliferative diabetic retinopathy; CSME clinically significant macular edema; DME diabetic macular edema; dbh dot blot hemorrhages; CWS cotton wool spot; POAG primary open angle glaucoma; C/D cup-to-disc ratio; HVF humphrey visual field; GVF goldmann visual field; OCT optical coherence tomography; IOP intraocular pressure; BRVO Branch retinal vein occlusion; CRVO central retinal vein occlusion; CRAO central retinal artery occlusion; BRAO branch retinal artery occlusion; RT retinal tear; SB scleral buckle; PPV pars plana vitrectomy; VH Vitreous hemorrhage; PRP panretinal laser photocoagulation; IVK intravitreal kenalog; VMT vitreomacular traction; MH Macular hole;  NVD neovascularization of the disc; NVE neovascularization elsewhere; AREDS age related eye disease study; ARMD age related macular degeneration; POAG primary open angle glaucoma; EBMD epithelial/anterior basement membrane dystrophy; ACIOL anterior chamber intraocular lens; IOL intraocular lens;  PCIOL posterior chamber intraocular lens; Phaco/IOL phacoemulsification with intraocular lens placement; [place] photorefractive keratectomy; LASIK  laser assisted in situ keratomileusis; HTN hypertension; DM diabetes mellitus; COPD chronic obstructive pulmonary disease

## 2022-02-15 ENCOUNTER — Ambulatory Visit (INDEPENDENT_AMBULATORY_CARE_PROVIDER_SITE_OTHER): Payer: Medicare Other | Admitting: Ophthalmology

## 2022-02-15 ENCOUNTER — Encounter (INDEPENDENT_AMBULATORY_CARE_PROVIDER_SITE_OTHER): Payer: Self-pay | Admitting: Ophthalmology

## 2022-02-15 DIAGNOSIS — H3563 Retinal hemorrhage, bilateral: Secondary | ICD-10-CM | POA: Diagnosis not present

## 2022-02-15 DIAGNOSIS — H34831 Tributary (branch) retinal vein occlusion, right eye, with macular edema: Secondary | ICD-10-CM

## 2022-02-15 DIAGNOSIS — I10 Essential (primary) hypertension: Secondary | ICD-10-CM

## 2022-02-15 DIAGNOSIS — H35033 Hypertensive retinopathy, bilateral: Secondary | ICD-10-CM | POA: Diagnosis not present

## 2022-02-15 DIAGNOSIS — H268 Other specified cataract: Secondary | ICD-10-CM

## 2022-02-15 DIAGNOSIS — H40053 Ocular hypertension, bilateral: Secondary | ICD-10-CM

## 2022-02-15 DIAGNOSIS — H25813 Combined forms of age-related cataract, bilateral: Secondary | ICD-10-CM

## 2022-02-15 MED ORDER — AFLIBERCEPT 2MG/0.05ML IZ SOLN FOR KALEIDOSCOPE
2.0000 mg | INTRAVITREAL | Status: AC | PRN
  Administered 2022-02-15: 2 mg via INTRAVITREAL

## 2022-03-14 NOTE — Progress Notes (Unsigned)
Cardiology Office Note:    Date:  03/15/2022   ID:  Billy Harrell, DOB [birth year], MRN [id number]  PCP:  Daisy Floro, MD  [facility] Providers Cardiologist:  Dietrich Pates, MD    Referring MD: Daisy Floro, MD   Chief Complaint:  F/u for AFib    Patient Profile: Hypertension Permanent atrial fibrillation S/p DCCV 11/18 >> ERAF CHADS2-VASc=2 (HTN, age) >> Apixaban Chronic kidney disease  ACE inhibitor and HCTZ d/c'd in the past due to worsening Creatinine RA [place] neg for RAS 11/2017  Prior CV Studies:   24 Hr BP Monitor 07/10/2019 24 hour BP monitor   Average BP through period is 118/79. mmHg Lowest systolic 89/  Mm Hg  Highest systolic 147/ mm Hg   Renal Artery [place] 11/07/17 No RAS bilat; normal celiac and SMA   Echo 04/06/17 Mild focal basal septal hypertrophy, EF 55-60, no RWMA, mild AI, mildly dilated Ao root (42 mm), mild MR, mod LAE, mod RAE, mild TR, PASP 32  History of Present Illness:   Billy Harrell is a 71 y.o. male with the above problem list.  He was last seen by Dr. Tenny Craw in Nov 22. He returns for f/u.  He is here alone.  He has not had chest pain.  He does note shortness of breath with exertion.  This is fairly chronic without significant change.  He has noted some fatigue over the past couple of months.  He also notes decreased exercise tolerance.  He has had some pain in both scapula with certain types of activity.  He has not had orthopnea, leg edema or syncope.    Past Medical History:  Diagnosis Date   Cataract    Mixed form OU   Chronic kidney disease    ACE inhibitor and HCTZ DCd in past due to rising Creatinine   Hypertension    [place] 6/19:  neg for RA stenosis bilat; normal mesenteric arteries   Hypertensive retinopathy    OU   Permanent atrial fibrillation    s/p DCCV 04/2017 >> failed // CHADS2-VASc=2 >> Apixaban Rx   Current Medications: Current Meds  Medication Sig   acetaminophen (TYLENOL) 500 MG tablet Take 500 mg by mouth as  needed for mild pain.    amLODipine (NORVASC) 5 MG tablet Take 1 tablet by mouth once daily   apixaban (ELIQUIS) 5 MG TABS tablet Take 1 tablet by mouth twice daily.   brimonidine (ALPHAGAN) 0.2 % ophthalmic solution Place 1 drop into both eyes 2 (two) times daily.   carvedilol (COREG) 6.25 MG tablet Take 1 tablet by mouth twice daily   dorzolamide-timolol (COSOPT) 22.3-6.8 MG/ML ophthalmic solution Place 1 drop into both eyes 2 (two) times daily.   fluticasone (FLONASE) 50 MCG/ACT nasal spray Place 1 spray into both nostrils as needed for allergies or rhinitis.   latanoprost (XALATAN) 0.005 % ophthalmic solution Place 1 drop into both eyes at bedtime.   Multiple Vitamin (MULTI-VITAMIN PO) Take 1 tablet by mouth daily.   Omega-3 Fatty Acids (FISH OIL) 1000 MG CAPS Take 1,000 mg by mouth as needed (for added supplement).     Allergies:   Patient has no known allergies.   Social History   Tobacco Use   Smoking status: Never   Smokeless tobacco: Never  Vaping Use   Vaping Use: Never used  Substance Use Topics   Alcohol use: No   Drug use: No    Family Hx: The patient's family history is not on  file.  Review of Systems  Constitutional: Negative for chills and fever.  Respiratory:  Negative for cough.   Gastrointestinal:  Negative for hematochezia and melena.  Genitourinary:  Positive for dysuria. Negative for hematuria.     EKGs/Labs/Other Test Reviewed:    EKG:  EKG is   ordered today.  The ekg ordered today demonstrates atrial fibrillation, HR 63, normal axis, QTc 413, no change from prior tracing  Recent Labs: 04/05/2021: BUN 22; Creatinine, Ser 1.50; Hemoglobin 13.1; Platelets 172; Potassium 4.3; Sodium 140   Recent Lipid Panel Recent Labs    04/05/21 0900  CHOL 168  TRIG 72  HDL 40  LDLCALC 114*     Risk Assessment/Calculations/Metrics:    CHA2DS2-VASc Score = 2   This indicates a 2.2% annual risk of stroke. The patient's score is based upon: CHF History:  0 HTN History: 1 Diabetes History: 0 Stroke History: 0 Vascular Disease History: 0 Age Score: 1 Gender Score: 0             Physical Exam:    VS:  BP 130/78   Pulse 63   Ht 5\' 10"  (1.778 m)   Wt 176 lb 6.4 oz (80 kg)   SpO2 98%   BMI 25.31 kg/m     Wt Readings from Last 3 Encounters:  03/15/22 176 lb 6.4 oz (80 kg)  04/05/21 181 lb (82.1 kg)  01/27/20 185 lb (83.9 kg)    Constitutional:      Appearance: Healthy appearance. Not in distress.  Neck:     Vascular: JVD normal.  Pulmonary:     Effort: Pulmonary effort is normal.     Breath sounds: No wheezing. No rales.  Cardiovascular:     Normal rate. Irregularly irregular rhythm. Normal S1. Normal S2.      Murmurs: There is no murmur.  Edema:    Peripheral edema absent.  Abdominal:     Palpations: Abdomen is soft.  Skin:    General: Skin is warm and dry.  Neurological:     General: No focal deficit present.     Mental Status: Alert and oriented to person, place and time.         ASSESSMENT & PLAN:   Dyspnea He notes symptoms of fatigue and exercise intolerance over the past 2-3 months. He notes shortness of breath with exertion that is more chronic. He has not had chest pain but has noted bilateral scapular pain. Question if his symptoms could represent an anginal equivalent. He has not noticed bleeding but since he is on chronic anticoagulation we need to r/o anemia. He had mild MR and mild TR on echocardiogram in 2018. His exam does not suggest worsening and he is not having clear symptoms of CHF. But we should also make sure he has not had any significant change with his EF, valvular status, pulmonary pressures, etc.  Lexiscan Myoview  Echocardiogram  BMET, CBC, TSH  Persistent atrial fibrillation (HCC) Rate is controlled. Based upon age, weight and SCr, he should continue Eliquis 5 mg twice daily. Obtain BMET, CBC. F/u 6 mos or sooner if workup abnormal.   Essential hypertension Blood pressure controlled.   Continue amlodipine 5 mg daily, carvedilol 6.25 mg twice daily.  Dysuria The patient notes some dysuria over the past couple of months.  He has not had any fevers or chills.  Obtain UA +/- culture.  If abnormal, he will need follow-up with primary care.  Fatigue Obtain labs as noted including CBC, BMET  and TSH.  He is also to undergo echocardiogram and stress testing.  If work-up is unremarkable, he should follow-up with primary care for further evaluation.        Shared Decision Making/Informed Consent The risks [chest pain, shortness of breath, cardiac arrhythmias, dizziness, blood pressure fluctuations, myocardial infarction, stroke/transient ischemic attack, nausea, vomiting, allergic reaction, radiation exposure, metallic taste sensation and life-threatening complications (estimated to be 1 in 10,000)], benefits (risk stratification, diagnosing coronary artery disease, treatment guidance) and alternatives of a nuclear stress test were discussed in detail with Mr. Ferrell and he agrees to proceed.   Dispo:  Return in about 6 months (around 09/14/2022) for Routine Follow Up, w/ Dr. Tenny Craw, or Tereso Newcomer, PA-C.   Medication Adjustments/Labs and Tests Ordered: Current medicines are reviewed at length with the patient today.  Concerns regarding medicines are outlined above.  Tests Ordered: Orders Placed This Encounter  Procedures   Basic metabolic panel   CBC   TSH   Urinalysis, Routine w reflex microscopic   MYOCARDIAL PERFUSION IMAGING   EKG 12-Lead   ECHOCARDIOGRAM COMPLETE   Medication Changes: No orders of the defined types were placed in this encounter.  Signed, Tereso Newcomer, PA-C  03/15/2022 8:48 AM    [facility] [address] [place], [place], [place]  [address] Phone: [phone]; Fax: [phone]

## 2022-03-15 ENCOUNTER — Encounter: Payer: Self-pay | Admitting: Physician Assistant

## 2022-03-15 ENCOUNTER — Other Ambulatory Visit: Payer: Self-pay | Admitting: *Deleted

## 2022-03-15 ENCOUNTER — Ambulatory Visit: Payer: Medicare Other | Attending: Physician Assistant | Admitting: Physician Assistant

## 2022-03-15 VITALS — BP 130/78 | HR 63 | Ht 70.0 in | Wt 176.4 lb

## 2022-03-15 DIAGNOSIS — R5383 Other fatigue: Secondary | ICD-10-CM

## 2022-03-15 DIAGNOSIS — R300 Dysuria: Secondary | ICD-10-CM

## 2022-03-15 DIAGNOSIS — R0600 Dyspnea, unspecified: Secondary | ICD-10-CM | POA: Insufficient documentation

## 2022-03-15 DIAGNOSIS — R0602 Shortness of breath: Secondary | ICD-10-CM

## 2022-03-15 DIAGNOSIS — I10 Essential (primary) hypertension: Secondary | ICD-10-CM | POA: Diagnosis not present

## 2022-03-15 DIAGNOSIS — I4819 Other persistent atrial fibrillation: Secondary | ICD-10-CM

## 2022-03-15 NOTE — Assessment & Plan Note (Signed)
Obtain labs as noted including CBC, BMET and TSH.  He is also to undergo echocardiogram and stress testing.  If work-up is unremarkable, he should follow-up with primary care for further evaluation.

## 2022-03-15 NOTE — Patient Instructions (Addendum)
Medication Instructions:  Your physician recommends that you continue on your current medications as directed. Please refer to the Current Medication list given to you today.  *If you need a refill on your cardiac medications before your next appointment, please call your pharmacy*   Lab Work: TODAY:  UA W/REFLEX CULTURE, BMET, CBC, & TSH  If you have labs (blood work) drawn today and your tests are completely normal, you will receive your results only by: [place] (if you have MyChart) OR A paper copy in the mail If you have any lab test that is abnormal or we need to change your treatment, we will call you to review the results.   Testing/Procedures: Your physician has requested that you have an echocardiogram. Echocardiography is a painless test that uses sound waves to create images of your heart. It provides your doctor with information about the size and shape of your heart and how well your heart's chambers and valves are working. This procedure takes approximately one hour. There are no restrictions for this procedure.   Your physician has requested that you have a lexiscan myoview. For further information please visit [email or web address]. Please follow instruction sheet, BELOW:    You are scheduled for a Myocardial Perfusion Imaging Study  Please arrive 15 minutes prior to your appointment time for registration and insurance purposes.  The test will take approximately 3 to 4 hours to complete; you may bring reading material.  If someone comes with you to your appointment, they will need to remain in the main lobby due to limited space in the testing area. **If you are pregnant or breastfeeding, please notify the nuclear lab prior to your appointment**  How to prepare for your Myocardial Perfusion Test: Do not eat or drink 3 hours prior to your test, except you may have water. Do not consume products containing caffeine (regular or decaffeinated) 12 hours prior to your  test. (ex: coffee, chocolate, sodas, tea). Do bring a list of your current medications with you.  If not listed below, you may take your medications as normal. Do wear comfortable clothes (no dresses or overalls) and walking shoes, tennis shoes preferred (No heels or open toe shoes are allowed). Do NOT wear cologne, perfume, aftershave, or lotions (deodorant is allowed). If these instructions are not followed, your test will have to be rescheduled.   Follow-Up: At [facility], you and your health needs are our priority.  As part of our continuing mission to provide you with exceptional heart care, we have created designated Provider Care Teams.  These Care Teams include your primary Cardiologist (physician) and Advanced Practice Providers (APPs -  Physician Assistants and Nurse Practitioners) who all work together to provide you with the care you need, when you need it.  We recommend signing up for the patient portal called "MyChart".  Sign up information is provided on this After Visit Summary.  MyChart is used to connect with patients for Virtual Visits (Telemedicine).  Patients are able to view lab/test results, encounter notes, upcoming appointments, etc.  Non-urgent messages can be sent to your provider as well.   To learn more about what you can do with MyChart, go to [email or web address].    Your next appointment:   6 month(s)  The format for your next appointment:   In Person  Provider:   Dorris Carnes, MD  or Richardson Dopp, PA-C         Other Instructions   Important Information About Sugar

## 2022-03-15 NOTE — Assessment & Plan Note (Signed)
The patient notes some dysuria over the past couple of months.  He has not had any fevers or chills.  Obtain UA +/- culture.  If abnormal, he will need follow-up with primary care.

## 2022-03-15 NOTE — Assessment & Plan Note (Addendum)
He notes symptoms of fatigue and exercise intolerance over the past 2-3 months. He notes shortness of breath with exertion that is more chronic. He has not had chest pain but has noted bilateral scapular pain. Question if his symptoms could represent an anginal equivalent. He has not noticed bleeding but since he is on chronic anticoagulation we need to r/o anemia. He had mild MR and mild TR on echocardiogram in 2018. His exam does not suggest worsening and he is not having clear symptoms of CHF. But we should also make sure he has not had any significant change with his EF, valvular status, pulmonary pressures, etc.  Lexiscan Myoview  Echocardiogram  BMET, CBC, TSH

## 2022-03-15 NOTE — Assessment & Plan Note (Addendum)
Rate is controlled. Based upon age, weight and SCr, he should continue Eliquis 5 mg twice daily. Obtain BMET, CBC. F/u 6 mos or sooner if workup abnormal.

## 2022-03-15 NOTE — Assessment & Plan Note (Signed)
Blood pressure controlled.  Continue amlodipine 5 mg daily, carvedilol 6.25 mg twice daily.

## 2022-03-16 ENCOUNTER — Telehealth: Payer: Self-pay | Admitting: Physician Assistant

## 2022-03-16 DIAGNOSIS — R7989 Other specified abnormal findings of blood chemistry: Secondary | ICD-10-CM

## 2022-03-16 DIAGNOSIS — Z79899 Other long term (current) drug therapy: Secondary | ICD-10-CM

## 2022-03-16 DIAGNOSIS — I4819 Other persistent atrial fibrillation: Secondary | ICD-10-CM

## 2022-03-16 LAB — BASIC METABOLIC PANEL
BUN/Creatinine Ratio: 22 (ref 10–24)
BUN: 37 mg/dL — ABNORMAL HIGH (ref 8–27)
CO2: 22 mmol/L (ref 20–29)
Calcium: 9.5 mg/dL (ref 8.6–10.2)
Chloride: 102 mmol/L (ref 96–106)
Creatinine, Ser: 1.69 mg/dL — ABNORMAL HIGH (ref 0.76–1.27)
Glucose: 94 mg/dL (ref 70–99)
Potassium: 4.6 mmol/L (ref 3.5–5.2)
Sodium: 139 mmol/L (ref 134–144)
eGFR: 43 mL/min/{1.73_m2} — ABNORMAL LOW (ref >59)

## 2022-03-16 LAB — CBC
Hematocrit: 37.3 % — ABNORMAL LOW (ref 37.5–51.0)
Hemoglobin: 13.1 g/dL (ref 13.0–17.7)
MCH: 29.2 pg (ref 26.6–33.0)
MCHC: 35.1 g/dL (ref 31.5–35.7)
MCV: 83 fL (ref 79–97)
Platelets: 173 10*3/uL (ref 150–450)
RBC: 4.49 x10E6/uL (ref 4.14–5.80)
RDW: 12.6 % (ref 11.6–15.4)
WBC: 3.9 10*3/uL (ref 3.4–10.8)

## 2022-03-16 LAB — URINALYSIS, ROUTINE W REFLEX MICROSCOPIC
Bilirubin, UA: NEGATIVE
Glucose, UA: NEGATIVE
Ketones, UA: NEGATIVE
Leukocytes,UA: NEGATIVE
Nitrite, UA: NEGATIVE
Protein,UA: NEGATIVE
RBC, UA: NEGATIVE
Specific Gravity, UA: 1.012 (ref 1.005–1.030)
Urobilinogen, Ur: 0.2 mg/dL (ref 0.2–1.0)
pH, UA: 7 (ref 5.0–7.5)

## 2022-03-16 LAB — TSH: TSH: 2.15 u[IU]/mL (ref 0.450–4.500)

## 2022-03-16 NOTE — Telephone Encounter (Signed)
Pts daughter verbalized understanding if the pts lab results and he will have a bmet at his 04/13/22 visit when he is here for his stress test and echo.

## 2022-03-16 NOTE — Telephone Encounter (Signed)
Patient's daughter returning call for lab results. She states it is okay to leave a detailed voicemail.

## 2022-04-05 NOTE — Progress Notes (Addendum)
[facility] Note  04/12/2022     CHIEF COMPLAINT Patient presents for Retina Follow Up  HISTORY OF PRESENT ILLNESS: Billy Harrell is a 71 y.o. male who presents to the clinic today for:   HPI     Retina Follow Up   Patient presents with  Other.  In both eyes.  Severity is moderate.  Duration of 8 weeks.  Since onset it is stable.  I, the attending physician,  performed the HPI with the patient and updated documentation appropriately.        Comments   Pt here for 8 wk ret f/u for ret hemes/HTN Ret OU. Pt states VA the same, no changes.        Last edited by Bernarda Caffey, MD on 04/13/2022 10:22 PM.     Referring physician: Lawerance Cruel, MD [place],  [place] [id number]  HISTORICAL INFORMATION:   Selected notes from the MEDICAL RECORD NUMBER Referred by Dr. Parke Simmers for retina eval   CURRENT MEDICATIONS: Current Outpatient Medications (Ophthalmic Drugs)  Medication Sig   brimonidine (ALPHAGAN) 0.2 % ophthalmic solution Place 1 drop into both eyes 2 (two) times daily.   dorzolamide-timolol (COSOPT) 22.3-6.8 MG/ML ophthalmic solution Place 1 drop into both eyes 2 (two) times daily.   latanoprost (XALATAN) 0.005 % ophthalmic solution Place 1 drop into both eyes at bedtime.   No current facility-administered medications for this visit. (Ophthalmic Drugs)   Current Outpatient Medications (Other)  Medication Sig   acetaminophen (TYLENOL) 500 MG tablet Take 500 mg by mouth as needed for mild pain.    amLODipine (NORVASC) 5 MG tablet Take 1 tablet by mouth once daily   apixaban (ELIQUIS) 5 MG TABS tablet Take 1 tablet by mouth twice daily.   carvedilol (COREG) 6.25 MG tablet Take 1 tablet by mouth twice daily   fluticasone (FLONASE) 50 MCG/ACT nasal spray Place 1 spray into both nostrils as needed for allergies or rhinitis.   Multiple Vitamin (MULTI-VITAMIN PO) Take 1 tablet by mouth daily.   Omega-3 Fatty Acids (FISH OIL)  1000 MG CAPS Take 1,000 mg by mouth as needed (for added supplement).    No current facility-administered medications for this visit. (Other)   REVIEW OF SYSTEMS: ROS   Positive for: Genitourinary, Endocrine, Cardiovascular, Eyes Negative for: Constitutional, Gastrointestinal, Neurological, Skin, Musculoskeletal, HENT, Respiratory, Psychiatric, Allergic/Imm, Heme/Lymph Last edited by Kingsley Spittle, COT on 04/12/2022  8:32 AM.     ALLERGIES No Known Allergies  PAST MEDICAL HISTORY Past Medical History:  Diagnosis Date   Cataract    Mixed form OU   Chronic kidney disease    ACE inhibitor and HCTZ DCd in past due to rising Creatinine   History of nuclear stress test    Myoview 04/2022: EF 65, normal perfusion, low risk   Hypertension    [place] 6/19:  neg for RA stenosis bilat; normal mesenteric arteries   Hypertensive retinopathy    OU   Permanent atrial fibrillation    s/p DCCV 04/2017 >> failed // CHADS2-VASc=2 >> Apixaban Rx   Past Surgical History:  Procedure Laterality Date   CARDIOVERSION N/A 04/30/2017   Procedure: CARDIOVERSION;  Surgeon: Josue Hector, MD;  Location: [facility];  Service: Cardiovascular;  Laterality: N/A;   FAMILY HISTORY History reviewed. No pertinent family history.  SOCIAL HISTORY Social History   Tobacco Use   Smoking status: Never   Smokeless tobacco: Never  Vaping Use   Vaping  Use: Never used  Substance Use Topics   Alcohol use: No   Drug use: No       OPHTHALMIC EXAM:  Base Eye Exam     Visual Acuity (Snellen - Linear)       Right Left   Dist [place] 20/25 20/20   Dist ph [place] 20/20 -2          Tonometry (Tonopen, 8:36 AM)       Right Left   Pressure 17 18         Pupils       Dark Light Shape React APD   Right 3 2 Round Minimal None   Left 3 2 Round Minimal None         Visual Fields (Counting fingers)       Left Right    Full Full         Extraocular Movement       Right Left    Full, Ortho  Full, Ortho         Neuro/Psych     Oriented x3: Yes   Mood/Affect: Normal         Dilation     Both eyes: 1.0% Mydriacyl, 2.5% Phenylephrine @ 8:37 AM           Slit Lamp and Fundus Exam     Slit Lamp Exam       Right Left   Lids/Lashes Dermatochalasis - upper lid, Meibomian gland dysfunction Dermatochalasis - upper lid, Meibomian gland dysfunction   Conjunctiva/Sclera Mild Melanosis Mild Melanosis   Cornea 1-2+Punctate epithelial erosions, mild Debris in tear film Trace tear film debris, 1+ Punctate epithelial erosions   Anterior Chamber Deep, 1+ pigment Deep and quiet   Iris Round and moderately dilated, pigment clump at 0800 midzone Round and moderately dilated   Lens 2-3+ Nuclear sclerosis, 2-3+ Cortical cataract, Pseudoexfoliative material on anterior capsule 2+ Nuclear sclerosis, 2-3+ Cortical cataract, early pseudoexfoliation   Anterior Vitreous Mild Vitreous syneresis, Posterior vitreous detachment Mild Vitreous syneresis, vitreous condensations         Fundus Exam       Right Left   Disc Pink and Sharp, +cupping Pink and Sharp, +cupping   C/D Ratio 0.7 0.6   Macula Flat, good foveal reflex, central IRH - improved, cystic changes centrally - stably improved, RPE mottling and clumping, no discreet targets for focal laser Flat, good foveal reflex, mild RPE mottling and clumping, rare MA, focal cystic changes IN mac -- improved   Vessels attenuated, mild tortuosity mild attenuation, mild tortuosity   Periphery Attached, No heme Attached, No heme           IMAGING AND PROCEDURES  Imaging and Procedures for _0 @  OCT, Retina - OU - Both Eyes       Right Eye Quality was good. Central Foveal Thickness: 252. Progression has been stable. Findings include normal foveal contour, no IRF, no SRF, intraretinal hyper-reflective material (Stable improvement in IRF/cystic changes and IRHM nasal fovea ).   Left Eye Quality was good. Central Foveal Thickness:  266. Progression has improved. Findings include no SRF, abnormal foveal contour (Interval improvement in cystic changes nasal fovea).   Notes *Images captured and stored on drive  Diagnosis / Impression:  OD: stable improvement in IRF/cystic changes and IRHM nasal fovea OS: NFP, Interval improvement in cystic changes nasal fovea   Clinical management:  See below  Abbreviations: NFP - Normal foveal profile. CME - cystoid macular edema. PED -  pigment epithelial detachment. IRF - intraretinal fluid. SRF - subretinal fluid. EZ - ellipsoid zone. ERM - epiretinal membrane. ORA - outer retinal atrophy. ORT - outer retinal tubulation. SRHM - subretinal hyper-reflective material      Intravitreal Injection, Pharmacologic Agent - OD - Right Eye       Time Out 04/12/2022. 9:46 AM. Confirmed correct patient, procedure, site, and patient consented.   Anesthesia Topical anesthesia was used. Anesthetic medications included Lidocaine 2%, Proparacaine 0.5%.   Procedure Preparation included 5% betadine to ocular surface, eyelid speculum. A (32g) needle was used.   Injection: 2 mg aflibercept 2 MG/0.05ML   Route: Intravitreal, Site: Right Eye   NDC: [id number], Lot: [id number], Expiration date: 07/06/2023, Waste: 0 mL   Post-op Post injection exam found visual acuity of at least counting fingers. The patient tolerated the procedure well. There were no complications. The patient received written and verbal post procedure care education. Post injection medications were not given.            ASSESSMENT/PLAN:   ICD-10-CM   1. Essential hypertension  I10     2. Hypertensive retinopathy of both eyes  H35.033 OCT, Retina - OU - Both Eyes    3. Retinal hemorrhage of both eyes  H35.63 OCT, Retina - OU - Both Eyes    4. Branch retinal vein occlusion of right eye with macular edema  H34.8310 OCT, Retina - OU - Both Eyes    Intravitreal Injection, Pharmacologic Agent - OD - Right Eye     aflibercept (EYLEA) SOLN 2 mg    5. Combined forms of age-related cataract of both eyes  H25.813     6. 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 Hypertensive retinopathy with retinal hemorrhage OU and retinal edema OD  - IVA OD #1 (10.11.22), #2 (11.08.22), #3 (12.06.22), #4 (1.3.23) -- IVA resistance  - s/p IVE OD #1 (sample) 01.31.23, #2 (03.01.23), #3 (03.29.23), #4 (04.20.23), #5 (05.24.23), #6 (06.21.23), #7 (07.26.23), #8 (09.13.23)  - original OCT showed focal IRF/IRHM OD; OS with mild IRHM, no edema  - FA (01.14.21) shows focal, parafoveal hyperfluorescent leakage OU (OD >> OS)  - repeat FA 1.11.22 showed mild improvement in perifoveal leakage OD  - BP at initial visit 140s / 99 and 96 in R and L arms respectively  - suspect hemorrhage and edema related to HTN and use of eliquis for A fib  - differential includes mild, old BRVO w/ CME OD -- very similar in appearance to DME, but pt does not carry diagnosis of DM  - BP meds adjusted and BP improved  - exam shows stable improvement in IRF/cystic changes greatest nasally - BCVA OD stable at 20/25   - OCT shows OD: stable improvement in IRF/cystic changes and IRHM nasal fovea; OS: NFP, Interval improvement in cystic changes nasal fovea at 8 weeks  - pt's daughter reports development of macular edema around same time pt started latanoprost  - ?PGA-induced edema  - stopped latanoprost and started brimonidine BID OU -- no significant improvement in edema  - stopped PF and Prolensa  - recommend IVE OD #9 today, 11.08.23 with follow up extended to 10 weeks          - pt wishes to proceed with injection  - RBA of procedure discussed, questions answered - IVA informed consent obtained and signed, 10.11.22 (OD) - IVE informed consent obtained  and re-signed, 01.31.23 (OD) - see procedure note - f/u 10 weeks, sooner prn -- DFE/OCT, possible injection, tx and ext as  able  5. 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 Ocular hypertension OU  - IOP 17,18 today  - started on Latanoprost by Dr. Parke Simmers  - edema OD started about the same time per daughter  - switched from [place] to [place] due to possible macular edema from latan  - continue brimonidine BID OU  Ophthalmic Meds Ordered this visit:  Meds ordered this encounter  Medications   aflibercept (EYLEA) SOLN 2 mg     Return in about 10 weeks (around 06/21/2022) for f/u retinal heme OU, DFE, OCT.  There are no Patient Instructions on file for this visit.  Explained the diagnoses, plan, and follow up with the patient and they expressed understanding.  Patient expressed understanding of the importance of proper follow up care.   This document serves as a record of services personally performed by Gardiner Sleeper, MD, PhD. It was created on their behalf by Orvan Falconer, an ophthalmic technician. The creation of this record is the provider's dictation and/or activities during the visit.    Electronically signed by: Orvan Falconer, OA, 04/13/22  10:23 PM  This document serves as a record of services personally performed by Gardiner Sleeper, MD, PhD. It was created on their behalf by San Jetty. Owens Shark, OA an ophthalmic technician. The creation of this record is the provider's dictation and/or activities during the visit.    Electronically signed by: San Jetty. Marguerita Merles 11.08.2023  10:23 PM   Gardiner Sleeper, M.D., Ph.D. Diseases & Surgery of the Retina and Vitreous [facility]  I have reviewed the above documentation for accuracy and completeness, and I agree  with the above. Gardiner Sleeper, M.D., Ph.D. 04/13/22 10:25 PM   Abbreviations: M myopia (nearsighted); A astigmatism; H hyperopia (farsighted); P presbyopia; Mrx spectacle prescription;  CTL contact lenses; OD right eye; OS left eye; OU both eyes  XT exotropia; ET esotropia; PEK punctate epithelial keratitis; PEE punctate epithelial erosions; DES dry eye syndrome; MGD meibomian gland dysfunction; ATs artificial tears; PFAT's preservative free artificial tears; [place] nuclear sclerotic cataract; PSC posterior subcapsular cataract; ERM epi-retinal membrane; PVD posterior vitreous detachment; RD retinal detachment; DM diabetes mellitus; DR diabetic retinopathy; NPDR non-proliferative diabetic retinopathy; PDR proliferative diabetic retinopathy; CSME clinically significant macular edema; DME diabetic macular edema; dbh dot blot hemorrhages; CWS cotton wool spot; POAG primary open angle glaucoma; C/D cup-to-disc ratio; HVF humphrey visual field; GVF goldmann visual field; OCT optical coherence tomography; IOP intraocular pressure; BRVO Branch retinal vein occlusion; CRVO central retinal vein occlusion; CRAO central retinal artery occlusion; BRAO branch retinal artery occlusion; RT retinal tear; SB scleral buckle; PPV pars plana vitrectomy; VH Vitreous hemorrhage; PRP panretinal laser photocoagulation; IVK intravitreal kenalog; VMT vitreomacular traction; MH Macular hole;  NVD neovascularization of the disc; NVE neovascularization elsewhere; AREDS age related eye disease study; ARMD age related macular degeneration; POAG primary open angle glaucoma; EBMD epithelial/anterior basement membrane dystrophy; ACIOL anterior chamber intraocular lens; IOL intraocular lens; PCIOL posterior chamber intraocular lens; Phaco/IOL phacoemulsification with intraocular lens placement; PRK photorefractive  keratectomy; LASIK laser assisted in situ keratomileusis; HTN hypertension; DM diabetes mellitus; COPD chronic obstructive pulmonary  disease

## 2022-04-06 ENCOUNTER — Other Ambulatory Visit: Payer: Self-pay | Admitting: Physician Assistant

## 2022-04-06 ENCOUNTER — Telehealth (HOSPITAL_COMMUNITY): Payer: Self-pay | Admitting: *Deleted

## 2022-04-06 DIAGNOSIS — R0602 Shortness of breath: Secondary | ICD-10-CM

## 2022-04-06 NOTE — Telephone Encounter (Signed)
Per DPR left instructions on voicemail for MPI study on 04/13/22.

## 2022-04-12 ENCOUNTER — Encounter (INDEPENDENT_AMBULATORY_CARE_PROVIDER_SITE_OTHER): Payer: Self-pay | Admitting: Ophthalmology

## 2022-04-12 ENCOUNTER — Ambulatory Visit (INDEPENDENT_AMBULATORY_CARE_PROVIDER_SITE_OTHER): Payer: Medicare Other | Admitting: Ophthalmology

## 2022-04-12 DIAGNOSIS — H35033 Hypertensive retinopathy, bilateral: Secondary | ICD-10-CM

## 2022-04-12 DIAGNOSIS — H40053 Ocular hypertension, bilateral: Secondary | ICD-10-CM | POA: Diagnosis not present

## 2022-04-12 DIAGNOSIS — H34831 Tributary (branch) retinal vein occlusion, right eye, with macular edema: Secondary | ICD-10-CM | POA: Diagnosis not present

## 2022-04-12 DIAGNOSIS — H3563 Retinal hemorrhage, bilateral: Secondary | ICD-10-CM | POA: Diagnosis not present

## 2022-04-12 DIAGNOSIS — H268 Other specified cataract: Secondary | ICD-10-CM

## 2022-04-12 DIAGNOSIS — I10 Essential (primary) hypertension: Secondary | ICD-10-CM

## 2022-04-12 DIAGNOSIS — H25813 Combined forms of age-related cataract, bilateral: Secondary | ICD-10-CM

## 2022-04-12 MED ORDER — AFLIBERCEPT 2MG/0.05ML IZ SOLN FOR KALEIDOSCOPE
2.0000 mg | INTRAVITREAL | Status: AC | PRN
  Administered 2022-04-12: 2 mg via INTRAVITREAL

## 2022-04-13 ENCOUNTER — Ambulatory Visit (HOSPITAL_COMMUNITY): Payer: Medicare Other | Attending: Cardiology

## 2022-04-13 ENCOUNTER — Encounter: Payer: Self-pay | Admitting: Physician Assistant

## 2022-04-13 ENCOUNTER — Ambulatory Visit: Payer: Medicare Other

## 2022-04-13 ENCOUNTER — Ambulatory Visit (HOSPITAL_BASED_OUTPATIENT_CLINIC_OR_DEPARTMENT_OTHER): Payer: Medicare Other

## 2022-04-13 DIAGNOSIS — I4819 Other persistent atrial fibrillation: Secondary | ICD-10-CM | POA: Diagnosis not present

## 2022-04-13 DIAGNOSIS — Z79899 Other long term (current) drug therapy: Secondary | ICD-10-CM | POA: Insufficient documentation

## 2022-04-13 DIAGNOSIS — I10 Essential (primary) hypertension: Secondary | ICD-10-CM | POA: Diagnosis not present

## 2022-04-13 DIAGNOSIS — R7989 Other specified abnormal findings of blood chemistry: Secondary | ICD-10-CM | POA: Insufficient documentation

## 2022-04-13 DIAGNOSIS — R0602 Shortness of breath: Secondary | ICD-10-CM | POA: Diagnosis not present

## 2022-04-13 DIAGNOSIS — R5383 Other fatigue: Secondary | ICD-10-CM | POA: Insufficient documentation

## 2022-04-13 LAB — MYOCARDIAL PERFUSION IMAGING
LV dias vol: 92 mL (ref 62–150)
LV sys vol: 32 mL
Nuc Stress EF: 65 %
Peak HR: 109 {beats}/min
Rest HR: 71 {beats}/min
Rest Nuclear Isotope Dose: 10.3 mCi
SDS: 2
SRS: 1
SSS: 3
ST Depression (mm): 0 mm
Stress Nuclear Isotope Dose: 32 mCi
TID: 0.95

## 2022-04-13 LAB — BASIC METABOLIC PANEL
BUN/Creatinine Ratio: 13 (ref 10–24)
BUN: 23 mg/dL (ref 8–27)
CO2: 25 mmol/L (ref 20–29)
Calcium: 9.2 mg/dL (ref 8.6–10.2)
Chloride: 104 mmol/L (ref 96–106)
Creatinine, Ser: 1.83 mg/dL — ABNORMAL HIGH (ref 0.76–1.27)
Glucose: 94 mg/dL (ref 70–99)
Potassium: 5 mmol/L (ref 3.5–5.2)
Sodium: 140 mmol/L (ref 134–144)
eGFR: 39 mL/min/{1.73_m2} — ABNORMAL LOW (ref >59)

## 2022-04-13 LAB — ECHOCARDIOGRAM COMPLETE
P 1/2 time: 693 msec
S' Lateral: 3.5 cm

## 2022-04-13 MED ORDER — TECHNETIUM TC 99M TETROFOSMIN IV KIT
32.4000 | PACK | Freq: Once | INTRAVENOUS | Status: AC | PRN
  Administered 2022-04-13: 32.4 via INTRAVENOUS

## 2022-04-13 MED ORDER — REGADENOSON 0.4 MG/5ML IV SOLN
0.4000 mg | Freq: Once | INTRAVENOUS | Status: AC
  Administered 2022-04-13: 0.4 mg via INTRAVENOUS

## 2022-04-13 MED ORDER — TECHNETIUM TC 99M TETROFOSMIN IV KIT
9.9000 | PACK | Freq: Once | INTRAVENOUS | Status: AC | PRN
  Administered 2022-04-13: 9.9 via INTRAVENOUS

## 2022-04-14 ENCOUNTER — Telehealth: Payer: Self-pay | Admitting: *Deleted

## 2022-04-14 DIAGNOSIS — N179 Acute kidney failure, unspecified: Secondary | ICD-10-CM

## 2022-04-14 NOTE — Progress Notes (Signed)
Pt has been made aware of normal result and verbalized understanding.  jw

## 2022-04-14 NOTE — Telephone Encounter (Signed)
-----   Message from Beatrice Lecher, [place] sent at 04/13/2022  9:52 PM EST ----- Creatinine slightly more elevated. K+ normal.  Pt not on an nephrotoxic meds. Creatinine trending up. Age < 79, Wt > 60 kg >> Ok to continue Eliquis 5 mg twice daily.  PLAN:  -I think it would be good to refer to Nephrology if the pt is ok with it. -If he is ok with it, refer to Nephrology -Send copy to PCP  Tereso Newcomer, PA-C    04/13/2022 9:49 PM

## 2022-04-17 ENCOUNTER — Encounter: Payer: Self-pay | Admitting: Physician Assistant

## 2022-04-17 DIAGNOSIS — I351 Nonrheumatic aortic (valve) insufficiency: Secondary | ICD-10-CM | POA: Insufficient documentation

## 2022-04-17 HISTORY — DX: Nonrheumatic aortic (valve) insufficiency: I35.1

## 2022-04-19 ENCOUNTER — Encounter: Payer: Self-pay | Admitting: Physician Assistant

## 2022-04-19 ENCOUNTER — Ambulatory Visit: Payer: Medicare Other | Attending: Physician Assistant | Admitting: Physician Assistant

## 2022-04-19 VITALS — BP 132/80 | HR 68 | Ht 70.0 in | Wt 170.8 lb

## 2022-04-19 DIAGNOSIS — N1832 Chronic kidney disease, stage 3b: Secondary | ICD-10-CM

## 2022-04-19 DIAGNOSIS — I10 Essential (primary) hypertension: Secondary | ICD-10-CM

## 2022-04-19 DIAGNOSIS — I4819 Other persistent atrial fibrillation: Secondary | ICD-10-CM

## 2022-04-19 DIAGNOSIS — N183 Chronic kidney disease, stage 3 unspecified: Secondary | ICD-10-CM | POA: Insufficient documentation

## 2022-04-19 DIAGNOSIS — I351 Nonrheumatic aortic (valve) insufficiency: Secondary | ICD-10-CM | POA: Diagnosis not present

## 2022-04-19 NOTE — Progress Notes (Signed)
Cardiology Office Note:    Date:  04/19/2022   ID:  Billy Harrell, DOB [birth year], MRN [id number]  PCP:  Lawerance Cruel, [place] Providers Cardiologist:  Dorris Carnes, MD    Referring MD: Lawerance Cruel, MD   Chief Complaint:  F/u on aortic insufficiency    Patient Profile: Hypertension Permanent atrial fibrillation S/p DCCV 11/18 >> ERAF CHADS2-VASc=2 (HTN, age) >> Apixaban Chronic kidney disease  ACE inhibitor and HCTZ d/c'd in the past due to worsening Creatinine RA [place] neg for RAS 11/2017 Aortic insufficiency     Prior CV Studies: 24 Hr BP Monitor 07/10/2019 24 hour BP monitor   Average BP through period is 118/79. mmHg Lowest systolic [id number]  Mm Hg  Highest systolic 0000000 mm Hg   Renal Artery [place] 11/07/17 No RAS bilat; normal celiac and SMA   Echo 04/06/17 Mild focal basal septal hypertrophy, EF 55-60, no RWMA, mild AI, mildly dilated Ao root (42 mm), mild MR, mod LAE, mod RAE, mild TR, PASP 32  History of Present Illness:   Billy Harrell is a 71 y.o. male with the above problem list.  He was last seen 03/15/22. He was having symptoms of fatigue and exercise intolerance. Hgb, TSH, U/A were normal. Nuclear stress test was normal. His creatinine has remained elevated and has increased slightly. I have referred him to Nephrology. A f/u echocardiogram showed normal EF, normal PASP, mild to mod MR and mod to severe AI. I reviewed this with Dr. Harrington Challenger who recommended proceeding with a TEE. He returns to arrange the procedure. He is here with his daughter. He continues to note fatigue and exercise intolerance. He has had some dyspnea on exertion as well. He has not had chest pain, near syncope, syncope, orthopnea. He has some leg edema that resolves with elevation.      Past Medical History:  Diagnosis Date   Aortic insufficiency 04/17/2022   Echocardiogram 04/2022: EF 55-60, no RWMA, GLS -18.6, normal RVSF, normal PASP (RVSP 33), mild LAE, mod RAE, mild to mod MR,  mod TR, mod to severe AI, AV sclerosis w/o AS, mod PI, mild dilation of aortic root (43 mm), mild dilation of ascending aorta (41 mm), RAP 8   Cataract    Mixed form OU   Chronic kidney disease    ACE inhibitor and HCTZ DCd in past due to rising Creatinine   History of nuclear stress test    Myoview 04/2022: EF 65, normal perfusion, low risk   Hypertension    [place] 6/19:  neg for RA stenosis bilat; normal mesenteric arteries   Hypertensive retinopathy    OU   Permanent atrial fibrillation    s/p DCCV 04/2017 >> failed // CHADS2-VASc=2 >> Apixaban Rx   Current Medications: Current Meds  Medication Sig   acetaminophen (TYLENOL) 500 MG tablet Take 500 mg by mouth as needed for mild pain.    amLODipine (NORVASC) 5 MG tablet Take 1 tablet by mouth once daily   apixaban (ELIQUIS) 5 MG TABS tablet Take 1 tablet by mouth twice daily.   carvedilol (COREG) 6.25 MG tablet Take 1 tablet by mouth twice daily   dorzolamide-timolol (COSOPT) 22.3-6.8 MG/ML ophthalmic solution Place 1 drop into both eyes 2 (two) times daily.   fluticasone (FLONASE) 50 MCG/ACT nasal spray Place 1 spray into both nostrils as needed for allergies or rhinitis.   Multiple Vitamin (MULTI-VITAMIN PO) Take 1 tablet by mouth daily.   Omega-3 Fatty Acids (FISH OIL) 1000  MG CAPS Take 1,000 mg by mouth as needed (for added supplement).     Allergies:   Patient has no known allergies.     Social History   Occupational History   Not on file  Tobacco Use   Smoking status: Never   Smokeless tobacco: Never  Vaping Use   Vaping Use: Never used  Substance and Sexual Activity   Alcohol use: No   Drug use: No   Sexual activity: Not Currently    Family Hx: The patient's family history is not on file.  Review of Systems  Gastrointestinal:  Negative for hematemesis and melena.  Genitourinary:  Negative for hematuria.     EKGs/Labs/Other Test Reviewed:    EKG:  EKG is not ordered today.    Recent Labs: 03/15/2022:  Hemoglobin 13.1; Platelets 173; TSH 2.150 04/13/2022: BUN 23; Creatinine, Ser 1.83; Potassium 5.0; Sodium 140   Recent Lipid Panel No results for input(s): "CHOL", "TRIG", "HDL", "VLDL", "LDLCALC", "LDLDIRECT" in the last 8760 hours.    Risk Assessment/Calculations/Metrics:    CHA2DS2-VASc Score = 2   This indicates a 2.2% annual risk of stroke. The patient's score is based upon: CHF History: 0 HTN History: 1 Diabetes History: 0 Stroke History: 0 Vascular Disease History: 0 Age Score: 1 Gender Score: 0             Physical Exam:    VS:  BP 132/80   Pulse 68   Ht 5\' 10"  (1.778 m)   Wt 170 lb 12.8 oz (77.5 kg)   SpO2 96%   BMI 24.51 kg/m     Wt Readings from Last 3 Encounters:  04/19/22 170 lb 12.8 oz (77.5 kg)  04/13/22 176 lb (79.8 kg)  03/15/22 176 lb 6.4 oz (80 kg)    Constitutional:      Appearance: Healthy appearance. Not in distress.  Neck:     Vascular: No JVR. JVD normal.  Pulmonary:     Effort: Pulmonary effort is normal.     Breath sounds: No wheezing. No rales.  Cardiovascular:     Normal rate. Irregularly irregular rhythm. Normal S1. Normal S2.      Murmurs: There is no murmur.  Edema:    Peripheral edema absent.  Abdominal:     Palpations: Abdomen is soft.  Skin:    General: Skin is warm and dry.  Neurological:     General: No focal deficit present.     Mental Status: Alert and oriented to person, place and time.          ASSESSMENT & PLAN:   Moderate to severe aortic insufficiency Recent echocardiogram with mod to severe AI and mild to mod MR. His main symptom is fatigue/exercise intolerance and dyspnea on exertion. I cannot appreciate a murmur at all on his exam. Regardless, he needs a TEE to better evaluate his AI. If his AI is not severe, he will need to f/u with primary care to further evaluate his fatigue.  Arrange TEE this week Labs today: BMET, CBC  Persistent atrial fibrillation (HCC) Rate controlled. Age < 80, Wt > 60 kg.  Continue Eliquis 5 mg twice daily, Coreg 6.25 mg twice daily.   Essential hypertension BP well controlled. Continue Amlodipine 5 mg twice daily, Coreg 6.25 mg twice daily.   CKD (chronic kidney disease) stage 3, GFR 30-59 ml/min (HCC) GFR 39. Referral to Nephrology is pending.        Shared Decision Making/Informed Consent The risks [esophageal damage, perforation (1:10,000  risk), bleeding, pharyngeal hematoma as well as other potential complications associated with conscious sedation including aspiration, arrhythmia, respiratory failure and death], benefits (treatment guidance and diagnostic support) and alternatives of a transesophageal echocardiogram were discussed in detail with Billy Harrell and he is willing to proceed.    Dispo:  Return for Scheduled Follow Up, w/ Dr. Harrington Challenger, or Richardson Dopp, PA-C.   Medication Adjustments/Labs and Tests Ordered: Current medicines are reviewed at length with the patient today.  Concerns regarding medicines are outlined above.  Tests Ordered: Orders Placed This Encounter  Procedures   Basic metabolic panel   CBC   Medication Changes: No orders of the defined types were placed in this encounter.  Signed, Richardson Dopp, PA-C  04/19/2022 3:36 PM    [facility] [place] [address], [place], [place]  [address] Phone: [phone]; Fax: [phone]

## 2022-04-19 NOTE — Assessment & Plan Note (Signed)
Recent echocardiogram with mod to severe AI and mild to mod MR. His main symptom is fatigue/exercise intolerance and dyspnea on exertion. I cannot appreciate a murmur at all on his exam. Regardless, he needs a TEE to better evaluate his AI. If his AI is not severe, he will need to f/u with primary care to further evaluate his fatigue.  Arrange TEE this week Labs today: BMET, CBC

## 2022-04-19 NOTE — H&P (View-Only) (Signed)
Cardiology Office Note:    Date:  04/19/2022   ID:  Billy Harrell, DOB 10/04/1950, MRN [id number]  PCP:  Ross, Charles Alan, MD  [facility] Providers Cardiologist:  Paula Ross, MD    Referring MD: Ross, Charles Alan, MD   Chief Complaint:  F/u on aortic insufficiency    Patient Profile: Hypertension Permanent atrial fibrillation S/p DCCV 11/18 >> ERAF CHADS2-VASc=2 (HTN, age) >> Apixaban Chronic kidney disease  ACE inhibitor and HCTZ d/c'd in the past due to worsening Creatinine RA US neg for RAS 11/2017 Aortic insufficiency     Prior CV Studies: 24 Hr BP Monitor 07/10/2019 24 hour BP monitor   Average BP through period is 118/79. mmHg Lowest systolic 89/  Mm Hg  Highest systolic 147/ mm Hg   Renal Artery US 11/07/17 No RAS bilat; normal celiac and SMA   Echo 04/06/17 Mild focal basal septal hypertrophy, EF 55-60, no RWMA, mild AI, mildly dilated Ao root (42 mm), mild MR, mod LAE, mod RAE, mild TR, PASP 32  History of Present Illness:   Billy Harrell is a 71 y.o. male with the above problem list.  He was last seen 03/15/22. He was having symptoms of fatigue and exercise intolerance. Hgb, TSH, U/A were normal. Nuclear stress test was normal. His creatinine has remained elevated and has increased slightly. I have referred him to Nephrology. A f/u echocardiogram showed normal EF, normal PASP, mild to mod MR and mod to severe AI. I reviewed this with Dr. Ross who recommended proceeding with a TEE. He returns to arrange the procedure. He is here with his daughter. He continues to note fatigue and exercise intolerance. He has had some dyspnea on exertion as well. He has not had chest pain, near syncope, syncope, orthopnea. He has some leg edema that resolves with elevation.      Past Medical History:  Diagnosis Date   Aortic insufficiency 04/17/2022   Echocardiogram 04/2022: EF 55-60, no RWMA, GLS -18.6, normal RVSF, normal PASP (RVSP 33), mild LAE, mod RAE, mild to mod MR,  mod TR, mod to severe AI, AV sclerosis w/o AS, mod PI, mild dilation of aortic root (43 mm), mild dilation of ascending aorta (41 mm), RAP 8   Cataract    Mixed form OU   Chronic kidney disease    ACE inhibitor and HCTZ DCd in past due to rising Creatinine   History of nuclear stress test    Myoview 04/2022: EF 65, normal perfusion, low risk   Hypertension    US 6/19:  neg for RA stenosis bilat; normal mesenteric arteries   Hypertensive retinopathy    OU   Permanent atrial fibrillation    s/p DCCV 04/2017 >> failed // CHADS2-VASc=2 >> Apixaban Rx   Current Medications: Current Meds  Medication Sig   acetaminophen (TYLENOL) 500 MG tablet Take 500 mg by mouth as needed for mild pain.    amLODipine (NORVASC) 5 MG tablet Take 1 tablet by mouth once daily   apixaban (ELIQUIS) 5 MG TABS tablet Take 1 tablet by mouth twice daily.   carvedilol (COREG) 6.25 MG tablet Take 1 tablet by mouth twice daily   dorzolamide-timolol (COSOPT) 22.3-6.8 MG/ML ophthalmic solution Place 1 drop into both eyes 2 (two) times daily.   fluticasone (FLONASE) 50 MCG/ACT nasal spray Place 1 spray into both nostrils as needed for allergies or rhinitis.   Multiple Vitamin (MULTI-VITAMIN PO) Take 1 tablet by mouth daily.   Omega-3 Fatty Acids (FISH OIL) 1000   MG CAPS Take 1,000 mg by mouth as needed (for added supplement).     Allergies:   Patient has no known allergies.     Social History   Occupational History   Not on file  Tobacco Use   Smoking status: Never   Smokeless tobacco: Never  Vaping Use   Vaping Use: Never used  Substance and Sexual Activity   Alcohol use: No   Drug use: No   Sexual activity: Not Currently    Family Hx: The patient's family history is not on file.  Review of Systems  Gastrointestinal:  Negative for hematemesis and melena.  Genitourinary:  Negative for hematuria.     EKGs/Labs/Other Test Reviewed:    EKG:  EKG is not ordered today.    Recent Labs: 03/15/2022:  Hemoglobin 13.1; Platelets 173; TSH 2.150 04/13/2022: BUN 23; Creatinine, Ser 1.83; Potassium 5.0; Sodium 140   Recent Lipid Panel No results for input(s): "CHOL", "TRIG", "HDL", "VLDL", "LDLCALC", "LDLDIRECT" in the last 8760 hours.    Risk Assessment/Calculations/Metrics:    CHA2DS2-VASc Score = 2   This indicates a 2.2% annual risk of stroke. The patient's score is based upon: CHF History: 0 HTN History: 1 Diabetes History: 0 Stroke History: 0 Vascular Disease History: 0 Age Score: 1 Gender Score: 0             Physical Exam:    VS:  BP 132/80   Pulse 68   Ht 5\' 10"  (1.778 m)   Wt 170 lb 12.8 oz (77.5 kg)   SpO2 96%   BMI 24.51 kg/m     Wt Readings from Last 3 Encounters:  04/19/22 170 lb 12.8 oz (77.5 kg)  04/13/22 176 lb (79.8 kg)  03/15/22 176 lb 6.4 oz (80 kg)    Constitutional:      Appearance: Healthy appearance. Not in distress.  Neck:     Vascular: No JVR. JVD normal.  Pulmonary:     Effort: Pulmonary effort is normal.     Breath sounds: No wheezing. No rales.  Cardiovascular:     Normal rate. Irregularly irregular rhythm. Normal S1. Normal S2.      Murmurs: There is no murmur.  Edema:    Peripheral edema absent.  Abdominal:     Palpations: Abdomen is soft.  Skin:    General: Skin is warm and dry.  Neurological:     General: No focal deficit present.     Mental Status: Alert and oriented to person, place and time.          ASSESSMENT & PLAN:   Moderate to severe aortic insufficiency Recent echocardiogram with mod to severe AI and mild to mod MR. His main symptom is fatigue/exercise intolerance and dyspnea on exertion. I cannot appreciate a murmur at all on his exam. Regardless, he needs a TEE to better evaluate his AI. If his AI is not severe, he will need to f/u with primary care to further evaluate his fatigue.  Arrange TEE this week Labs today: BMET, CBC  Persistent atrial fibrillation (HCC) Rate controlled. Age < 80, Wt > 60 kg.  Continue Eliquis 5 mg twice daily, Coreg 6.25 mg twice daily.   Essential hypertension BP well controlled. Continue Amlodipine 5 mg twice daily, Coreg 6.25 mg twice daily.   CKD (chronic kidney disease) stage 3, GFR 30-59 ml/min (HCC) GFR 39. Referral to Nephrology is pending.        Shared Decision Making/Informed Consent The risks [esophageal damage, perforation (1:10,000  risk), bleeding, pharyngeal hematoma as well as other potential complications associated with conscious sedation including aspiration, arrhythmia, respiratory failure and death], benefits (treatment guidance and diagnostic support) and alternatives of a transesophageal echocardiogram were discussed in detail with Mr. Mcneese and he is willing to proceed.    Dispo:  Return for Scheduled Follow Up, w/ Dr. Ross, or Phillipa Morden, PA-C.   Medication Adjustments/Labs and Tests Ordered: Current medicines are reviewed at length with the patient today.  Concerns regarding medicines are outlined above.  Tests Ordered: Orders Placed This Encounter  Procedures   Basic metabolic panel   CBC   Medication Changes: No orders of the defined types were placed in this encounter.  Signed, Ronica Vivian, PA-C  04/19/2022 3:36 PM    [facility] [address], [place], [place]  [address] Phone: [phone]; Fax: [phone]  

## 2022-04-19 NOTE — Patient Instructions (Addendum)
Medication Instructions:  Your physician recommends that you continue on your current medications as directed. Please refer to the Current Medication list given to you today.  *If you need a refill on your cardiac medications before your next appointment, please call your pharmacy*   Lab Work: TODAY:  BMET & CBC  If you have labs (blood work) drawn today and your tests are completely normal, you will receive your results only by: MyChart Message (if you have MyChart) OR A paper copy in the mail If you have any lab test that is abnormal or we need to change your treatment, we will call you to review the results.   Testing/Procedures: Your physician has requested that you have a TEE. During a TEE, sound waves are used to create images of your heart. It provides your doctor with information about the size and shape of your heart and how well your heart's chambers and valves are working. In this test, a transducer is attached to the end of a flexible tube that's guided down your throat and into your esophagus (the tube leading from you mouth to your stomach) to get a more detailed image of your heart. You are not awake for the procedure. Please see the instruction sheet given to you today. For further information please visit [email or web address]      Dear Billy Harrell  You are scheduled for a TEE (Transesophageal Echocardiogram) on _______ with Dr. __________.  Please arrive at the [facility] (Main Entrance A) at [facility]: [address] [place], [place] [address] at ______.   DIET:  Nothing to eat or drink after midnight except a sip of water with medications (see medication instructions below)  MEDICATION INSTRUCTIONS: You can take your regular medications.    Continue taking your anticoagulant (blood thinner): Apixaban (Eliquis).   You will need to continue this after your procedure until you are told by your provider that it is safe to stop.    LABS: WILL BE DONE TODAY    FYI:  For your safety, and to allow [place] to monitor your vital signs accurately during the surgery/procedure we request: If you have artificial nails, gel coating, SNS etc, please have those removed prior to your surgery/procedure. Not having the nail coverings /polish removed may result in cancellation or delay of your surgery/procedure.  You must have a responsible person to drive you home and stay in the waiting area during your procedure. Failure to do so could result in cancellation.  Bring your insurance cards.  *Special Note: Every effort is made to have your procedure done on time. Occasionally there are emergencies that occur at the hospital that may cause delays. Please be patient if a delay does occur.      Follow-Up: At [facility], you and your health needs are our priority.  As part of our continuing mission to provide you with exceptional heart care, we have created designated Provider Care Teams.  These Care Teams include your primary Cardiologist (physician) and Advanced Practice Providers (APPs -  Physician Assistants and Nurse Practitioners) who all work together to provide you with the care you need, when you need it.  We recommend signing up for the patient portal called "MyChart".  Sign up information is provided on this After Visit Summary.  MyChart is used to connect with patients for Virtual Visits (Telemedicine).  Patients are able to view lab/test results, encounter notes, upcoming appointments, etc.  Non-urgent messages can be sent to your provider as  well.   To learn more about what you can do with MyChart, go to [email or web address].    Your next appointment:   As scheduled   The format for your next appointment:   In Person  Provider:   Tereso Newcomer, PA-C         Other Instructions   Important Information About Sugar

## 2022-04-19 NOTE — Assessment & Plan Note (Signed)
Rate controlled. Age < 80, Wt > 60 kg. Continue Eliquis 5 mg twice daily, Coreg 6.25 mg twice daily.

## 2022-04-19 NOTE — Assessment & Plan Note (Signed)
GFR 39. Referral to Nephrology is pending.

## 2022-04-19 NOTE — Assessment & Plan Note (Signed)
BP well controlled. Continue Amlodipine 5 mg twice daily, Coreg 6.25 mg twice daily.

## 2022-04-20 LAB — BASIC METABOLIC PANEL
BUN/Creatinine Ratio: 20 (ref 10–24)
BUN: 37 mg/dL — ABNORMAL HIGH (ref 8–27)
CO2: 24 mmol/L (ref 20–29)
Calcium: 9 mg/dL (ref 8.6–10.2)
Chloride: 102 mmol/L (ref 96–106)
Creatinine, Ser: 1.88 mg/dL — ABNORMAL HIGH (ref 0.76–1.27)
Glucose: 87 mg/dL (ref 70–99)
Potassium: 4.5 mmol/L (ref 3.5–5.2)
Sodium: 140 mmol/L (ref 134–144)
eGFR: 37 mL/min/{1.73_m2} — ABNORMAL LOW (ref >59)

## 2022-04-20 LAB — CBC
Hematocrit: 37.8 % (ref 37.5–51.0)
Hemoglobin: 12.9 g/dL — ABNORMAL LOW (ref 13.0–17.7)
MCH: 29.4 pg (ref 26.6–33.0)
MCHC: 34.1 g/dL (ref 31.5–35.7)
MCV: 86 fL (ref 79–97)
Platelets: 173 10*3/uL (ref 150–450)
RBC: 4.39 x10E6/uL (ref 4.14–5.80)
RDW: 13.9 % (ref 11.6–15.4)
WBC: 4.4 10*3/uL (ref 3.4–10.8)

## 2022-04-20 NOTE — Anesthesia Preprocedure Evaluation (Addendum)
Anesthesia Evaluation  Patient identified by MRN, date of birth, ID band Patient awake    Reviewed: Allergy & Precautions, NPO status , Patient's Chart, lab work & pertinent test results  Airway Mallampati: II  TM Distance: >3 FB Neck ROM: Full    Dental  (+) Dental Advisory Given   Pulmonary neg pulmonary ROS   Pulmonary exam normal breath sounds clear to auscultation       Cardiovascular hypertension, Pt. on medications + dysrhythmias Atrial Fibrillation + Valvular Problems/Murmurs (Mod to severe AI, mild to mod MR, mod TR) AI and MR  Rhythm:Irregular Rate:Normal + Diastolic murmurs Echo 34/1962  1. Moderate to severe aortic regurgitiation.   2. Left ventricular ejection fraction, by estimation, is 55 to 60%. The left ventricle has normal function. The left ventricle has no regional wall motion abnormalities. Left ventricular diastolic parameters are indeterminate. The average left ventricular global longitudinal strain is -18.6 %. The global longitudinal strain is normal.   3. Right ventricular systolic function is normal. The right ventricular  size is normal. There is normal pulmonary artery systolic pressure. The estimated right ventricular systolic pressure is 22.9 mmHg.   4. Left atrial size was mildly dilated.   5. Right atrial size was moderately dilated.   6. The mitral valve is normal in structure. Mild to moderate mitral valve regurgitation. No evidence of mitral stenosis.   7. Tricuspid valve regurgitation is moderate.   8. The aortic valve is tricuspid. There is mild calcification of the aortic valve. Aortic valve regurgitation is moderate to severe. Aortic valve sclerosis is present, with no evidence of aortic valve stenosis. Aortic regurgitation PHT measures 693 msec.   9. Pulmonic valve regurgitation is moderate.  10. Aortic dilatation noted. There is mild dilatation of the aortic root, measuring 43 mm. There is mild  dilatation of the ascending aorta, measuring 41 mm.  11. The inferior vena cava is dilated in size with >50% respiratory variability, suggesting right atrial pressure of 8 mmHg.   Comparison(s): 04/06/17 EF 55-60%. PA pressure 62mHg.     NM Stress 04/2022   The study is normal. The study is low risk.   No ST deviation was noted.   Left ventricular function is normal. End diastolic cavity size is normal. End systolic cavity size is normal.   Prior study not available for comparison.   Normal stress test.  Visualized dilated but with no TID and with normal volumes; this can be seen in atrial fibrillation.       Neuro/Psych negative neurological ROS  negative psych ROS   GI/Hepatic negative GI ROS, Neg liver ROS,,,  Endo/Other  negative endocrine ROS    Renal/GU Renal disease     Musculoskeletal negative musculoskeletal ROS (+)    Abdominal   Peds  Hematology negative hematology ROS (+)   Anesthesia Other Findings   Reproductive/Obstetrics                             Anesthesia Physical Anesthesia Plan  ASA: 4  Anesthesia Plan: MAC   Post-op Pain Management: Minimal or no pain anticipated   Induction: Intravenous  PONV Risk Score and Plan: 1 and Treatment may vary due to age or medical condition, Propofol infusion and TIVA  Airway Management Planned: Natural Airway  Additional Equipment:   Intra-op Plan:   Post-operative Plan:   Informed Consent: I have reviewed the patients History and Physical, chart, labs and discussed the procedure  including the risks, benefits and alternatives for the proposed anesthesia with the patient or authorized representative who has indicated his/her understanding and acceptance.     Dental advisory given  Plan Discussed with: CRNA  Anesthesia Plan Comments:         Anesthesia Quick Evaluation

## 2022-04-21 ENCOUNTER — Ambulatory Visit (HOSPITAL_BASED_OUTPATIENT_CLINIC_OR_DEPARTMENT_OTHER): Payer: Medicare Other | Admitting: Anesthesiology

## 2022-04-21 ENCOUNTER — Other Ambulatory Visit: Payer: Self-pay

## 2022-04-21 ENCOUNTER — Ambulatory Visit (HOSPITAL_COMMUNITY)
Admission: RE | Admit: 2022-04-21 | Discharge: 2022-04-21 | Disposition: A | Discharge status: Home / Self Care | Payer: Medicare Other | Attending: Cardiovascular Disease | Admitting: Cardiovascular Disease

## 2022-04-21 ENCOUNTER — Encounter (HOSPITAL_COMMUNITY)
Admission: RE | Disposition: A | Discharge status: Home / Self Care | Payer: Self-pay | Source: Home / Self Care | Attending: Cardiovascular Disease

## 2022-04-21 ENCOUNTER — Encounter (HOSPITAL_COMMUNITY): Payer: Self-pay | Admitting: Cardiovascular Disease

## 2022-04-21 ENCOUNTER — Ambulatory Visit (HOSPITAL_BASED_OUTPATIENT_CLINIC_OR_DEPARTMENT_OTHER)
Admission: RE | Admit: 2022-04-21 | Discharge: 2022-04-21 | Disposition: A | Discharge status: Home / Self Care | Payer: Medicare Other | Source: Ambulatory Visit | Attending: Physician Assistant | Admitting: Physician Assistant

## 2022-04-21 ENCOUNTER — Telehealth: Payer: Self-pay | Admitting: *Deleted

## 2022-04-21 ENCOUNTER — Ambulatory Visit (HOSPITAL_COMMUNITY): Payer: Medicare Other | Admitting: Anesthesiology

## 2022-04-21 DIAGNOSIS — I088 Other rheumatic multiple valve diseases: Secondary | ICD-10-CM | POA: Diagnosis not present

## 2022-04-21 DIAGNOSIS — I083 Combined rheumatic disorders of mitral, aortic and tricuspid valves: Secondary | ICD-10-CM

## 2022-04-21 DIAGNOSIS — I351 Nonrheumatic aortic (valve) insufficiency: Secondary | ICD-10-CM

## 2022-04-21 DIAGNOSIS — I77810 Thoracic aortic ectasia: Secondary | ICD-10-CM

## 2022-04-21 DIAGNOSIS — I10 Essential (primary) hypertension: Secondary | ICD-10-CM

## 2022-04-21 DIAGNOSIS — I4891 Unspecified atrial fibrillation: Secondary | ICD-10-CM

## 2022-04-21 DIAGNOSIS — N289 Disorder of kidney and ureter, unspecified: Secondary | ICD-10-CM | POA: Diagnosis not present

## 2022-04-21 HISTORY — PX: TEE WITHOUT CARDIOVERSION: SHX5443

## 2022-04-21 SURGERY — ECHOCARDIOGRAM, TRANSESOPHAGEAL
Anesthesia: Monitor Anesthesia Care

## 2022-04-21 MED ORDER — LIDOCAINE 2% (20 MG/ML) 5 ML SYRINGE
INTRAMUSCULAR | Status: DC | PRN
  Administered 2022-04-21: 30 mg via INTRAVENOUS

## 2022-04-21 MED ORDER — BUTAMBEN-TETRACAINE-BENZOCAINE 2-2-14 % EX AERO
INHALATION_SPRAY | CUTANEOUS | Status: DC | PRN
  Administered 2022-04-21: 2 via TOPICAL

## 2022-04-21 MED ORDER — PROPOFOL 500 MG/50ML IV EMUL
INTRAVENOUS | Status: DC | PRN
  Administered 2022-04-21: 75 ug/kg/min via INTRAVENOUS

## 2022-04-21 MED ORDER — SODIUM CHLORIDE 0.9 % IV SOLN: INTRAVENOUS | Status: DC | PRN

## 2022-04-21 MED ORDER — PROPOFOL 10 MG/ML IV BOLUS
INTRAVENOUS | Status: DC | PRN
  Administered 2022-04-21: 15 mg via INTRAVENOUS

## 2022-04-21 MED ORDER — SODIUM CHLORIDE 0.9 % IV SOLN: INTRAVENOUS | Status: DC

## 2022-04-21 NOTE — Op Note (Signed)
INDICATIONS: Aortic insufficiency  PROCEDURE:   Informed consent was obtained prior to the procedure. The risks, benefits and alternatives for the procedure were discussed and the patient comprehended these risks.  Risks include, but are not limited to, cough, sore throat, vomiting, nausea, somnolence, esophageal and stomach trauma or perforation, bleeding, low blood pressure, aspiration, pneumonia, infection, trauma to the teeth and death.    After a procedural time-out, the oropharynx was anesthetized with 20% benzocaine spray.   During this procedure the patient was administered IV propofol by Anesthesiology  The transesophageal probe was inserted in the esophagus and stomach without difficulty and multiple views were obtained.  The patient was kept under observation until the patient left the procedure room.  The patient left the procedure room in stable condition.   Agitated microbubble saline contrast was not administered.  COMPLICATIONS:    There were no immediate complications.  FINDINGS:  Mild-to-moderate (2+) AI with a central jet. Normal LVEF, nondilated LV. Severe biatrial dilation. Mildly dilated ascending aorta.  RECOMMENDATIONS:     Monitor AI with serial transthoracic echo.  Time Spent Directly with the Patient:  30 minutes   Billy Harrell 04/21/2022, 8:38 AM

## 2022-04-21 NOTE — Transfer of Care (Signed)
Immediate Anesthesia Transfer of Care Note  Patient: Billy Harrell  Procedure(s) Performed: TRANSESOPHAGEAL ECHOCARDIOGRAM (TEE)  Patient Location: Endoscopy Unit  Anesthesia Type:MAC  Level of Consciousness: awake, alert , and oriented  Airway & Oxygen Therapy: Patient Spontanous Breathing  Post-op Assessment: Report given to RN  Post vital signs: Reviewed and stable  Last Vitals:  Vitals Value Taken Time  BP 87/61 04/21/22 0840  Temp 36.4 C 04/21/22 0840  Pulse 71 04/21/22 0845  Resp 15 04/21/22 0845  SpO2 97 % 04/21/22 0845  Vitals shown include unvalidated device data.  Last Pain:  Vitals:   04/21/22 0840  TempSrc: Temporal  PainSc: 0-No pain         Complications: No notable events documented.

## 2022-04-21 NOTE — Anesthesia Procedure Notes (Signed)
Procedure Name: MAC Date/Time: 04/21/2022 8:15 AM  Performed by: Eligha Bridegroom, CRNAPre-anesthesia Checklist: Patient identified, Emergency Drugs available, Suction available, Timeout performed and Patient being monitored Patient Re-evaluated:Patient Re-evaluated prior to induction Oxygen Delivery Method: Nasal cannula Preoxygenation: Pre-oxygenation with 100% oxygen Induction Type: IV induction

## 2022-04-21 NOTE — Anesthesia Postprocedure Evaluation (Signed)
Anesthesia Post Note  Patient: Billy Harrell  Procedure(s) Performed: TRANSESOPHAGEAL ECHOCARDIOGRAM (TEE)     Patient location during evaluation: Endoscopy Anesthesia Type: MAC Level of consciousness: awake and alert Pain management: pain level controlled Vital Signs Assessment: post-procedure vital signs reviewed and stable Respiratory status: spontaneous breathing Cardiovascular status: stable Anesthetic complications: no   No notable events documented.  Last Vitals:  Vitals:   04/21/22 0850 04/21/22 0900  BP: 111/69 103/79  Pulse: 67 71  Resp: 19 17  Temp:    SpO2: 98% 97%    Last Pain:  Vitals:   04/21/22 0900  TempSrc:   PainSc: 0-No pain                 Nolon Nations

## 2022-04-21 NOTE — Progress Notes (Signed)
  Echocardiogram Echocardiogram Transesophageal has been performed.  Gerda Diss 04/21/2022, 8:44 AM

## 2022-04-21 NOTE — Telephone Encounter (Signed)
-----   Message from Beatrice Lecher, [place] sent at 04/21/2022 10:49 AM EST ----- Results sent to [facility] via MyChart. See MyChart comment. PLAN: -Send copy to PCP -I will fwd to Dr. Tenny Craw as Lorain Childes -Arrange repeat echocardiogram in 1 year (aortic insufficiency, dilated aortic root and ascending aorta) Tereso Newcomer, PA-C    04/21/2022 10:45 AM

## 2022-04-21 NOTE — Interval H&P Note (Signed)
History and Physical Interval Note:  04/21/2022 8:08 AM  Billy Harrell  has presented today for surgery, with the diagnosis of aortic insuffiency.  The various methods of treatment have been discussed with the patient and family. After consideration of risks, benefits and other options for treatment, the patient has consented to  Procedure(s): TRANSESOPHAGEAL ECHOCARDIOGRAM (TEE) (N/A) as a surgical intervention.  The patient's history has been reviewed, patient examined, no change in status, stable for surgery.  I have reviewed the patient's chart and labs.  Questions were answered to the patient's satisfaction.     [name]

## 2022-04-23 ENCOUNTER — Encounter (HOSPITAL_COMMUNITY): Payer: Self-pay | Admitting: Cardiovascular Disease

## 2022-04-24 NOTE — Progress Notes (Signed)
Attempted, unable to leave message/bt 

## 2022-04-24 NOTE — Progress Notes (Signed)
Pt has been made aware of normal result and verbalized understanding.  jw

## 2022-05-17 DIAGNOSIS — H401421 Capsular glaucoma with pseudoexfoliation of lens, left eye, mild stage: Secondary | ICD-10-CM | POA: Diagnosis not present

## 2022-05-17 DIAGNOSIS — H401412 Capsular glaucoma with pseudoexfoliation of lens, right eye, moderate stage: Secondary | ICD-10-CM | POA: Diagnosis not present

## 2022-06-15 NOTE — Progress Notes (Shared)
[facility] Note  06/21/2022     CHIEF COMPLAINT Patient presents for No chief complaint on file.  HISTORY OF PRESENT ILLNESS: Billy Harrell is a 72 y.o. male who presents to the clinic today for:     Referring physician: Lawerance Cruel, MD [place],  [place] [id number]  HISTORICAL INFORMATION:   Selected notes from the MEDICAL RECORD NUMBER Referred by Dr. Parke Simmers for retina eval   CURRENT MEDICATIONS: Current Outpatient Medications (Ophthalmic Drugs)  Medication Sig   dorzolamide-timolol (COSOPT) 22.3-6.8 MG/ML ophthalmic solution Place 1 drop into both eyes 2 (two) times daily.   No current facility-administered medications for this visit. (Ophthalmic Drugs)   Current Outpatient Medications (Other)  Medication Sig   acetaminophen (TYLENOL) 500 MG tablet Take 500 mg by mouth every 6 (six) hours as needed (pain.).   amLODipine (NORVASC) 5 MG tablet Take 1 tablet by mouth once daily   apixaban (ELIQUIS) 5 MG TABS tablet Take 1 tablet by mouth twice daily.   carvedilol (COREG) 6.25 MG tablet Take 1 tablet by mouth twice daily   fluticasone (FLONASE) 50 MCG/ACT nasal spray Place 1 spray into both nostrils as needed for allergies or rhinitis.   Multiple Vitamin (MULTIVITAMIN WITH MINERALS) TABS tablet Take 1 tablet by mouth in the morning.   Omega-3 Fatty Acids (FISH OIL) 1000 MG CAPS Take 1,000 mg by mouth in the morning.   No current facility-administered medications for this visit. (Other)   REVIEW OF SYSTEMS:   ALLERGIES No Known Allergies  PAST MEDICAL HISTORY Past Medical History:  Diagnosis Date   Aortic insufficiency 04/17/2022   Echocardiogram 04/2022: EF 55-60, no RWMA, GLS -18.6, normal RVSF, normal PASP (RVSP 33), mild LAE, mod RAE, mild to mod MR, mod TR, mod to severe AI, AV sclerosis w/o AS, mod PI, mild dilation of aortic root (43 mm), mild dilation of ascending aorta (41 mm), RAP 8   Cataract    Mixed  form OU   Chronic kidney disease    ACE inhibitor and HCTZ DCd in past due to rising Creatinine   History of nuclear stress test    Myoview 04/2022: EF 65, normal perfusion, low risk   Hypertension    [place] 6/19:  neg for RA stenosis bilat; normal mesenteric arteries   Hypertensive retinopathy    OU   Permanent atrial fibrillation    s/p DCCV 04/2017 >> failed // CHADS2-VASc=2 >> Apixaban Rx   Past Surgical History:  Procedure Laterality Date   CARDIOVERSION N/A 04/30/2017   Procedure: CARDIOVERSION;  Surgeon: Josue Hector, MD;  Location: [facility];  Service: Cardiovascular;  Laterality: N/A;   TEE WITHOUT CARDIOVERSION N/A 04/21/2022   Procedure: TRANSESOPHAGEAL ECHOCARDIOGRAM (TEE);  Surgeon: Sanda Klein, MD;  Location: [facility]COPY;  Service: Cardiovascular;  Laterality: N/A;   FAMILY HISTORY No family history on file.  SOCIAL HISTORY Social History   Tobacco Use   Smoking status: Never   Smokeless tobacco: Never  Vaping Use   Vaping Use: Never used  Substance Use Topics   Alcohol use: No   Drug use: No       OPHTHALMIC EXAM:  Not recorded    IMAGING AND PROCEDURES  Imaging and Procedures for @TODAY @          ASSESSMENT/PLAN: No diagnosis found.  1-4. Hypertensive retinopathy with retinal hemorrhage OU and retinal edema OD  - IVA OD #1 (10.11.22), #2 (11.08.22), #3 (12.06.22), #4 (1.3.23) --  IVA resistance  - s/p IVE OD #1 (sample) 01.31.23, #2 (03.01.23), #3 (03.29.23), #4 (04.20.23), #5 (05.24.23), #6 (06.21.23), #7 (07.26.23), #8 (09.13.23), #9 (11.08.23)  - original OCT showed focal IRF/IRHM OD; OS with mild IRHM, no edema  - FA (01.14.21) shows focal, parafoveal hyperfluorescent leakage OU (OD >> OS)  - repeat FA 1.11.22 showed mild improvement in perifoveal leakage OD  - BP at initial visit 140s / 99 and 96 in R and L arms respectively  - suspect hemorrhage and edema related to HTN and use of eliquis for A fib  - differential includes mild,  old BRVO w/ CME OD -- very similar in appearance to DME, but pt does not carry diagnosis of DM  - BP meds adjusted and BP improved  - exam shows stable improvement in IRF/cystic changes greatest nasally - BCVA OD stable at 20/25   - OCT shows OD: stable improvement in IRF/cystic changes and IRHM nasal fovea; OS: NFP, Interval improvement in cystic changes nasal fovea at 8 weeks  - pt's daughter reports development of macular edema around same time pt started latanoprost  - ?PGA-induced edema  - stopped latanoprost and started brimonidine BID OU -- no significant improvement in edema  - stopped PF and Prolensa  - recommend IVE OD #10 today, 01.17.24 with follow up extended to 10 weeks          - pt wishes to proceed with injection  - RBA of procedure discussed, questions answered - IVA informed consent obtained and signed, 10.11.22 (OD) - IVE informed consent obtained and re-signed, 01.31.23 (OD) - see procedure note - f/u 10 weeks, sooner prn -- DFE/OCT, possible injection, tx and ext as able  5. 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 Ocular hypertension OU  - IOP 17,18 today  - started on Latanoprost by Dr. Parke Simmers  - edema OD started about the same time per daughter  - switched from [place] to brim due to possible macular edema from latan  - continue brimonidine BID OU  Ophthalmic Meds Ordered this visit:  No orders of the defined types were placed in this encounter.    No follow-ups on file.  There are no Patient Instructions on file for this visit.  Explained the diagnoses, plan, and follow up with the patient and they expressed understanding.  Patient  expressed understanding of the importance of proper follow up care.   This document serves as a record of services personally performed by Gardiner Sleeper, MD, PhD. It was created on their behalf by Orvan Falconer, an ophthalmic technician. The creation of this record is the provider's dictation and/or activities during the visit.    Electronically signed by: Orvan Falconer, OA, 06/15/22  10:11 AM     Gardiner Sleeper, M.D., Ph.D. Diseases & Surgery of the Retina and Vitreous [facility]: M myopia (nearsighted); A astigmatism; H hyperopia (farsighted); P presbyopia; Mrx spectacle prescription;  CTL contact lenses; OD right eye; OS left eye; OU both eyes  XT exotropia; ET esotropia; PEK punctate epithelial keratitis; PEE punctate epithelial erosions; DES dry eye syndrome; MGD meibomian gland dysfunction; ATs artificial tears;  PFAT's preservative free artificial tears; NSC nuclear sclerotic cataract; PSC posterior subcapsular cataract; ERM epi-retinal membrane; PVD posterior vitreous detachment; RD retinal detachment; DM diabetes mellitus; DR diabetic retinopathy; NPDR non-proliferative diabetic retinopathy; PDR proliferative diabetic retinopathy; CSME clinically significant macular edema; DME diabetic macular edema; dbh dot blot hemorrhages; CWS cotton wool spot; POAG primary open angle glaucoma; C/D cup-to-disc ratio; HVF humphrey visual field; GVF goldmann visual field; OCT optical coherence tomography; IOP intraocular pressure; BRVO Branch retinal vein occlusion; CRVO central retinal vein occlusion; CRAO central retinal artery occlusion; BRAO branch retinal artery occlusion; RT retinal tear; SB scleral buckle; PPV pars plana vitrectomy; VH Vitreous hemorrhage; PRP panretinal laser photocoagulation; IVK intravitreal kenalog; VMT vitreomacular traction; MH Macular hole;  NVD neovascularization of the disc; NVE neovascularization elsewhere; AREDS age related  eye disease study; ARMD age related macular degeneration; POAG primary open angle glaucoma; EBMD epithelial/anterior basement membrane dystrophy; ACIOL anterior chamber intraocular lens; IOL intraocular lens; PCIOL posterior chamber intraocular lens; Phaco/IOL phacoemulsification with intraocular lens placement; PRK photorefractive keratectomy; LASIK laser assisted in situ keratomileusis; HTN hypertension; DM diabetes mellitus; COPD chronic obstructive pulmonary disease

## 2022-06-20 DIAGNOSIS — Z0000 Encounter for general adult medical examination without abnormal findings: Secondary | ICD-10-CM | POA: Diagnosis not present

## 2022-06-20 DIAGNOSIS — J3089 Other allergic rhinitis: Secondary | ICD-10-CM | POA: Diagnosis not present

## 2022-06-20 DIAGNOSIS — I10 Essential (primary) hypertension: Secondary | ICD-10-CM | POA: Diagnosis not present

## 2022-06-20 DIAGNOSIS — I4821 Permanent atrial fibrillation: Secondary | ICD-10-CM | POA: Diagnosis not present

## 2022-06-20 DIAGNOSIS — N1831 Chronic kidney disease, stage 3a: Secondary | ICD-10-CM | POA: Diagnosis not present

## 2022-06-20 DIAGNOSIS — Z1211 Encounter for screening for malignant neoplasm of colon: Secondary | ICD-10-CM | POA: Diagnosis not present

## 2022-06-20 DIAGNOSIS — D6859 Other primary thrombophilia: Secondary | ICD-10-CM | POA: Diagnosis not present

## 2022-06-21 ENCOUNTER — Encounter (INDEPENDENT_AMBULATORY_CARE_PROVIDER_SITE_OTHER): Payer: Self-pay | Admitting: Ophthalmology

## 2022-06-21 ENCOUNTER — Encounter (INDEPENDENT_AMBULATORY_CARE_PROVIDER_SITE_OTHER): Payer: Self-pay

## 2022-06-21 ENCOUNTER — Encounter (INDEPENDENT_AMBULATORY_CARE_PROVIDER_SITE_OTHER): Payer: Medicare Other | Admitting: Ophthalmology

## 2022-06-21 ENCOUNTER — Ambulatory Visit (INDEPENDENT_AMBULATORY_CARE_PROVIDER_SITE_OTHER): Payer: Medicare Other | Admitting: Ophthalmology

## 2022-06-21 DIAGNOSIS — H3563 Retinal hemorrhage, bilateral: Secondary | ICD-10-CM | POA: Diagnosis not present

## 2022-06-21 DIAGNOSIS — H35033 Hypertensive retinopathy, bilateral: Secondary | ICD-10-CM

## 2022-06-21 DIAGNOSIS — H268 Other specified cataract: Secondary | ICD-10-CM

## 2022-06-21 DIAGNOSIS — H34831 Tributary (branch) retinal vein occlusion, right eye, with macular edema: Secondary | ICD-10-CM

## 2022-06-21 DIAGNOSIS — H40053 Ocular hypertension, bilateral: Secondary | ICD-10-CM

## 2022-06-21 DIAGNOSIS — H25813 Combined forms of age-related cataract, bilateral: Secondary | ICD-10-CM

## 2022-06-21 DIAGNOSIS — I10 Essential (primary) hypertension: Secondary | ICD-10-CM

## 2022-06-21 NOTE — Progress Notes (Signed)
[facility] Note  06/21/2022     CHIEF COMPLAINT Patient presents for Retina Follow Up  HISTORY OF PRESENT ILLNESS: Billy Harrell is a 72 y.o. male who presents to the clinic today for:   HPI     Retina Follow Up   Patient presents with  Other.  In both eyes.  This started 10 weeks ago.  I, the attending physician,  performed the HPI with the patient and updated documentation appropriately.        Comments   Patient here for 10 weeks retina follow up for Ret hem OU. Patient states vision the same. No eye pain. Using blue top drops BID OU.       Last edited by Bernarda Caffey, MD on 06/22/2022  2:54 PM.     Referring physician: Lawerance Cruel, MD [place],  [place] [id number]  HISTORICAL INFORMATION:   Selected notes from the MEDICAL RECORD NUMBER Referred by Dr. Parke Simmers for retina eval   CURRENT MEDICATIONS: Current Outpatient Medications (Ophthalmic Drugs)  Medication Sig   dorzolamide-timolol (COSOPT) 22.3-6.8 MG/ML ophthalmic solution Place 1 drop into both eyes 2 (two) times daily.   No current facility-administered medications for this visit. (Ophthalmic Drugs)   Current Outpatient Medications (Other)  Medication Sig   acetaminophen (TYLENOL) 500 MG tablet Take 500 mg by mouth every 6 (six) hours as needed (pain.).   amLODipine (NORVASC) 5 MG tablet Take 1 tablet by mouth once daily   apixaban (ELIQUIS) 5 MG TABS tablet Take 1 tablet by mouth twice daily.   carvedilol (COREG) 6.25 MG tablet Take 1 tablet by mouth twice daily   fluticasone (FLONASE) 50 MCG/ACT nasal spray Place 1 spray into both nostrils as needed for allergies or rhinitis.   Multiple Vitamin (MULTIVITAMIN WITH MINERALS) TABS tablet Take 1 tablet by mouth in the morning.   Omega-3 Fatty Acids (FISH OIL) 1000 MG CAPS Take 1,000 mg by mouth in the morning.   No current facility-administered medications for this visit. (Other)   REVIEW OF SYSTEMS: ROS    Positive for: Genitourinary, Endocrine, Cardiovascular, Eyes Negative for: Constitutional, Gastrointestinal, Neurological, Skin, Musculoskeletal, HENT, Respiratory, Psychiatric, Allergic/Imm, Heme/Lymph Last edited by Roselee Nova D, COT on 06/21/2022  9:34 AM.     ALLERGIES No Known Allergies  PAST MEDICAL HISTORY Past Medical History:  Diagnosis Date   Aortic insufficiency 04/17/2022   Echocardiogram 04/2022: EF 55-60, no RWMA, GLS -18.6, normal RVSF, normal PASP (RVSP 33), mild LAE, mod RAE, mild to mod MR, mod TR, mod to severe AI, AV sclerosis w/o AS, mod PI, mild dilation of aortic root (43 mm), mild dilation of ascending aorta (41 mm), RAP 8   Cataract    Mixed form OU   Chronic kidney disease    ACE inhibitor and HCTZ DCd in past due to rising Creatinine   History of nuclear stress test    Myoview 04/2022: EF 65, normal perfusion, low risk   Hypertension    [place] 6/19:  neg for RA stenosis bilat; normal mesenteric arteries   Hypertensive retinopathy    OU   Permanent atrial fibrillation    s/p DCCV 04/2017 >> failed // CHADS2-VASc=2 >> Apixaban Rx   Past Surgical History:  Procedure Laterality Date   CARDIOVERSION N/A 04/30/2017   Procedure: CARDIOVERSION;  Surgeon: Josue Hector, MD;  Location: [facility];  Service: Cardiovascular;  Laterality: N/A;   TEE WITHOUT CARDIOVERSION N/A 04/21/2022   Procedure: TRANSESOPHAGEAL  ECHOCARDIOGRAM (TEE);  Surgeon: Thurmon Fair, MD;  Location: [facility] ENDOSCOPY;  Service: Cardiovascular;  Laterality: N/A;   FAMILY HISTORY History reviewed. No pertinent family history.  SOCIAL HISTORY Social History   Tobacco Use   Smoking status: Never   Smokeless tobacco: Never  Vaping Use   Vaping Use: Never used  Substance Use Topics   Alcohol use: No   Drug use: No       OPHTHALMIC EXAM:  Base Eye Exam     Visual Acuity (Snellen - Linear)       Right Left   Dist [place] 20/30 20/20   Dist ph [place] 20/20 -2          Tonometry  (Tonopen, 9:44 AM)       Right Left   Pressure 16 17         Pupils       Dark Light Shape React APD   Right 3 2 Round Minimal None   Left 3 2 Round Minimal None         Visual Fields (Counting fingers)       Left Right    Full Full         Extraocular Movement       Right Left    Full, Ortho Full, Ortho         Neuro/Psych     Oriented x3: Yes   Mood/Affect: Normal         Dilation     Both eyes: 1.0% Mydriacyl, 2.5% Phenylephrine @ 9:44 AM           Slit Lamp and Fundus Exam     Slit Lamp Exam       Right Left   Lids/Lashes Dermatochalasis - upper lid Dermatochalasis - upper lid, mild MGD   Conjunctiva/Sclera Mild Melanosis Mild Melanosis   Cornea 1+Punctate epithelial erosions, mild tear film debris Trace tear film debris, trace PEE   Anterior Chamber deep and clear Deep and quiet   Iris Round and moderately dilated, pigment clump at 0800 midzone Round and moderately dilated   Lens 2-3+ Nuclear sclerosis, 2-3+ Cortical cataract, Pseudoexfoliative material on anterior capsule 2-3+ Nuclear sclerosis, 2-3+ Cortical cataract, early pseudoexfoliation   Anterior Vitreous Mild syneresis, Posterior vitreous detachment Mild Vitreous syneresis, vitreous condensations         Fundus Exam       Right Left   Disc Pink and Sharp, +cupping Pink and Sharp, +cupping   C/D Ratio 0.7 0.6   Macula Flat, good foveal reflex, central IRH - improved, cystic changes centrally - stably improved, RPE mottling and clumping, no discreet targets for focal laser, +focal drusen Flat, good foveal reflex, mild RPE mottling and clumping, No heme or edema   Vessels mild attenuation, mild tortuosity attenuated, Tortuous, mild AV crossing changes   Periphery Attached, No heme Attached, No heme           Refraction     Wearing Rx       Sphere Cylinder Axis   Right [place] +0.50 170   Left -0.75 +0.75 170           IMAGING AND PROCEDURES  Imaging and Procedures  for @TODAY @  OCT, Retina - OU - Both Eyes       Right Eye Quality was good. Central Foveal Thickness: 249. Progression has been stable. Findings include normal foveal contour, no IRF, no SRF, intraretinal hyper-reflective material (Stable improvement in IRF/cystic changes and IRHM nasal fovea ).  Left Eye Quality was good. Central Foveal Thickness: 258. Progression has improved. Findings include no IRF, no SRF, abnormal foveal contour (Interval improvement in cystic changes nasal fovea).   Notes *Images captured and stored on drive  Diagnosis / Impression:  OD: stable improvement in IRF/cystic changes and IRHM nasal fovea OS: NFP, Interval improvement in cystic changes nasal fovea   Clinical management:  See below  Abbreviations: NFP - Normal foveal profile. CME - cystoid macular edema. PED - pigment epithelial detachment. IRF - intraretinal fluid. SRF - subretinal fluid. EZ - ellipsoid zone. ERM - epiretinal membrane. ORA - outer retinal atrophy. ORT - outer retinal tubulation. SRHM - subretinal hyper-reflective material            ASSESSMENT/PLAN:   ICD-10-CM   1. Essential hypertension  I10     2. Hypertensive retinopathy of both eyes  H35.033     3. Retinal hemorrhage of both eyes  H35.63     4. Branch retinal vein occlusion of right eye with macular edema  H34.8310 OCT, Retina - OU - Both Eyes    5. Combined forms of age-related cataract of both eyes  H25.813     6. 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 Hypertensive retinopathy with retinal hemorrhage OU and retinal edema OD  - s/p IVA OD #1 (10.11.22), #2 (11.08.22), #3 (12.06.22), #4 (1.3.23) -- IVA resistance  - s/p IVE OD #1 (sample) 01.31.23, #2 (03.01.23), #3 (03.29.23), #4 (04.20.23), #5 (05.24.23), #6 (06.21.23), #7 (07.26.23), #8 (09.13.23), #9 (11.08.23)  - original OCT showed focal IRF/IRHM OD; OS with mild IRHM, no edema  - FA  (01.14.21) shows focal, parafoveal hyperfluorescent leakage OU (OD >> OS)  - repeat FA 1.11.22 showed mild improvement in perifoveal leakage OD  - BP at initial visit 140s / 99 and 96 in R and L arms respectively  - suspect hemorrhage and edema related to HTN and use of eliquis for A fib  - differential includes mild, old BRVO w/ CME OD -- very similar in appearance to DME, but pt does not carry diagnosis of DM  - BP meds adjusted and BP improved  - exam shows stable improvement in IRF/cystic changes greatest nasally - BCVA OD stably improved to 20/20   - OCT shows OD: stable improvement in IRF/cystic changes nasal fovea; OS: NFP, Interval improvement in cystic changes nasal fovea at 10 weeks  - pt's daughter reports development of macular edema around same time pt started latanoprost  - ?PGA-induced edema  - stopped latanoprost and started brimonidine BID OU -- no significant improvement in edema  - stopped PF and Prolensa  - recommend holding injection today with follow up in 10 weeks -- recommend PRN tx          - pt in agreement - IVA informed consent obtained and signed, 10.11.22 (OD) - IVE informed consent obtained and re-signed, 01.31.23 (OD) - see procedure note - f/u 10 weeks, sooner prn -- DFE/OCT,  5. 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 Ocular hypertension OU  - IOP 16,17 today  -  started on Latanoprost by Dr. Parke Simmers  - edema OD started about the same time per daughter  - switched from [place] to brim due to possible macular edema from latan  - continue brimonidine bid OU  Ophthalmic Meds Ordered this visit:  No orders of the defined types were placed  in this encounter.    Return in about 10 weeks (around 08/30/2022) for f/u HTN ret OD, DFE, OCT.  There are no Patient Instructions on file for this visit.  Explained the diagnoses, plan, and follow up with the patient and they expressed understanding.  Patient expressed understanding of the importance of proper follow up care.   This document serves as a record of services personally performed by Gardiner Sleeper, MD, PhD. It was created on their behalf by Roselee Nova, COMT. The creation of this record is the provider's dictation and/or activities during the visit.  Electronically signed by: Roselee Nova, COMT 06/22/22 2:55 PM  This document serves as a record of services personally performed by Gardiner Sleeper, MD, PhD. It was created on their behalf by San Jetty. Owens Shark, OA an ophthalmic technician. The creation of this record is the provider's dictation and/or activities during the visit.    Electronically signed by: San Jetty. Owens Shark, OA 01.17.20242:55 PM  Gardiner Sleeper, M.D., Ph.D. Diseases & Surgery of the Retina and Vitreous [facility]  I have reviewed the above documentation for accuracy and completeness, and I agree with the above. Gardiner Sleeper, M.D., Ph.D. 06/22/22 2:57 PM  Abbreviations: M myopia (nearsighted); A astigmatism; H hyperopia (farsighted); P presbyopia; Mrx spectacle prescription;  CTL contact lenses; OD right eye; OS left eye; OU both eyes  XT exotropia; ET esotropia; PEK punctate epithelial keratitis; PEE punctate epithelial erosions; DES dry eye syndrome; MGD meibomian gland dysfunction; ATs artificial tears; PFAT's preservative free artificial tears; [place] nuclear sclerotic cataract; PSC posterior subcapsular cataract; ERM epi-retinal membrane; PVD posterior vitreous detachment; RD retinal detachment; DM diabetes mellitus; DR diabetic retinopathy; NPDR non-proliferative diabetic retinopathy; PDR proliferative diabetic retinopathy; CSME  clinically significant macular edema; DME diabetic macular edema; dbh dot blot hemorrhages; CWS cotton wool spot; POAG primary open angle glaucoma; C/D cup-to-disc ratio; HVF humphrey visual field; GVF goldmann visual field; OCT optical coherence tomography; IOP intraocular pressure; BRVO Branch retinal vein occlusion; CRVO central retinal vein occlusion; CRAO central retinal artery occlusion; BRAO branch retinal artery occlusion; RT retinal tear; SB scleral buckle; PPV pars plana vitrectomy; VH Vitreous hemorrhage; PRP panretinal laser photocoagulation; IVK intravitreal kenalog; VMT vitreomacular traction; MH Macular hole;  NVD neovascularization of the disc; NVE neovascularization elsewhere; AREDS age related eye disease study; ARMD age related macular degeneration; POAG primary open angle glaucoma; EBMD epithelial/anterior basement membrane dystrophy; ACIOL anterior chamber intraocular lens; IOL intraocular lens; PCIOL posterior chamber intraocular lens; Phaco/IOL phacoemulsification with intraocular lens placement; [place] photorefractive keratectomy; LASIK laser assisted in situ keratomileusis; HTN hypertension; DM diabetes mellitus; COPD chronic obstructive pulmonary disease

## 2022-06-22 ENCOUNTER — Encounter (INDEPENDENT_AMBULATORY_CARE_PROVIDER_SITE_OTHER): Payer: Self-pay | Admitting: Ophthalmology

## 2022-06-29 DIAGNOSIS — I4891 Unspecified atrial fibrillation: Secondary | ICD-10-CM | POA: Diagnosis not present

## 2022-06-29 DIAGNOSIS — M549 Dorsalgia, unspecified: Secondary | ICD-10-CM | POA: Diagnosis not present

## 2022-06-29 DIAGNOSIS — N1832 Chronic kidney disease, stage 3b: Secondary | ICD-10-CM | POA: Diagnosis not present

## 2022-06-29 DIAGNOSIS — I129 Hypertensive chronic kidney disease with stage 1 through stage 4 chronic kidney disease, or unspecified chronic kidney disease: Secondary | ICD-10-CM | POA: Diagnosis not present

## 2022-06-30 ENCOUNTER — Other Ambulatory Visit: Payer: Self-pay | Admitting: Internal Medicine

## 2022-06-30 DIAGNOSIS — M549 Dorsalgia, unspecified: Secondary | ICD-10-CM

## 2022-06-30 DIAGNOSIS — N1832 Chronic kidney disease, stage 3b: Secondary | ICD-10-CM

## 2022-08-09 ENCOUNTER — Ambulatory Visit
Admission: RE | Admit: 2022-08-09 | Discharge: 2022-08-09 | Disposition: A | Discharge status: Home / Self Care | Payer: Medicare Other | Source: Ambulatory Visit | Attending: Internal Medicine | Admitting: Internal Medicine

## 2022-08-09 DIAGNOSIS — N189 Chronic kidney disease, unspecified: Secondary | ICD-10-CM | POA: Diagnosis not present

## 2022-08-09 DIAGNOSIS — N1832 Chronic kidney disease, stage 3b: Secondary | ICD-10-CM

## 2022-08-09 DIAGNOSIS — N200 Calculus of kidney: Secondary | ICD-10-CM | POA: Diagnosis not present

## 2022-08-09 DIAGNOSIS — M549 Dorsalgia, unspecified: Secondary | ICD-10-CM

## 2022-08-09 DIAGNOSIS — N281 Cyst of kidney, acquired: Secondary | ICD-10-CM | POA: Diagnosis not present

## 2022-08-10 ENCOUNTER — Other Ambulatory Visit: Payer: Self-pay | Admitting: Internal Medicine

## 2022-08-10 NOTE — Telephone Encounter (Signed)
Prescription refill request for Eliquis received. Indication: afib  Last office visit: Kathlen Mody 04/19/2022 Scr: 1.88, 04/19/2022 Age: 72 yo  Weight: 77.6 kg   Refill sent.

## 2022-08-21 NOTE — Progress Notes (Signed)
[facility] Note  08/30/2022     CHIEF COMPLAINT Patient presents for Retina Follow Up  HISTORY OF PRESENT ILLNESS: Billy Harrell is a 72 y.o. male who presents to the clinic today for:   HPI     Retina Follow Up   Patient presents with  Other.  In both eyes.  This started years ago.  Duration of 10 weeks.  I, the attending physician,  performed the HPI with the patient and updated documentation appropriately.        Comments   Patient feels that the vision is the same. He is using Cosopt OU BID.       Last edited by Bernarda Caffey, MD on 08/30/2022  8:33 AM.    Pt feels like it was hard to read the letters on the eye chart today  Referring physician: Lisabeth Pick, MD [place],  [place] [address]  HISTORICAL INFORMATION:   Selected notes from the MEDICAL RECORD NUMBER Referred by Dr. Parke Simmers for retina eval   CURRENT MEDICATIONS: Current Outpatient Medications (Ophthalmic Drugs)  Medication Sig   dorzolamide-timolol (COSOPT) 22.3-6.8 MG/ML ophthalmic solution Place 1 drop into both eyes 2 (two) times daily.   No current facility-administered medications for this visit. (Ophthalmic Drugs)   Current Outpatient Medications (Other)  Medication Sig   acetaminophen (TYLENOL) 500 MG tablet Take 500 mg by mouth every 6 (six) hours as needed (pain.).   amLODipine (NORVASC) 5 MG tablet Take 1 tablet by mouth once daily   apixaban (ELIQUIS) 5 MG TABS tablet Take 1 tablet by mouth twice daily   carvedilol (COREG) 6.25 MG tablet Take 1 tablet by mouth twice daily   fluticasone (FLONASE) 50 MCG/ACT nasal spray Place 1 spray into both nostrils as needed for allergies or rhinitis.   Multiple Vitamin (MULTIVITAMIN WITH MINERALS) TABS tablet Take 1 tablet by mouth in the morning.   Omega-3 Fatty Acids (FISH OIL) 1000 MG CAPS Take 1,000 mg by mouth in the morning.   No current facility-administered medications for this visit. (Other)    REVIEW OF SYSTEMS: ROS   Positive for: Genitourinary, Endocrine, Cardiovascular, Eyes Negative for: Constitutional, Gastrointestinal, Neurological, Skin, Musculoskeletal, HENT, Respiratory, Psychiatric, Allergic/Imm, Heme/Lymph Last edited by Annie Paras, COT on 08/30/2022  7:49 AM.      ALLERGIES No Known Allergies  PAST MEDICAL HISTORY Past Medical History:  Diagnosis Date   Aortic insufficiency 04/17/2022   Echocardiogram 04/2022: EF 55-60, no RWMA, GLS -18.6, normal RVSF, normal PASP (RVSP 33), mild LAE, mod RAE, mild to mod MR, mod TR, mod to severe AI, AV sclerosis w/o AS, mod PI, mild dilation of aortic root (43 mm), mild dilation of ascending aorta (41 mm), RAP 8   Cataract    Mixed form OU   Chronic kidney disease    ACE inhibitor and HCTZ DCd in past due to rising Creatinine   History of nuclear stress test    Myoview 04/2022: EF 65, normal perfusion, low risk   Hypertension    [place] 6/19:  neg for RA stenosis bilat; normal mesenteric arteries   Hypertensive retinopathy    OU   Permanent atrial fibrillation    s/p DCCV 04/2017 >> failed // CHADS2-VASc=2 >> Apixaban Rx   Past Surgical History:  Procedure Laterality Date   CARDIOVERSION N/A 04/30/2017   Procedure: CARDIOVERSION;  Surgeon: Josue Hector, MD;  Location: [facility] ENDOSCOPY;  Service: Cardiovascular;  Laterality: N/A;   TEE WITHOUT  CARDIOVERSION N/A 04/21/2022   Procedure: TRANSESOPHAGEAL ECHOCARDIOGRAM (TEE);  Surgeon: Sanda Klein, MD;  Location: [facility];  Service: Cardiovascular;  Laterality: N/A;   FAMILY HISTORY History reviewed. No pertinent family history.  SOCIAL HISTORY Social History   Tobacco Use   Smoking status: Never   Smokeless tobacco: Never  Vaping Use   Vaping Use: Never used  Substance Use Topics   Alcohol use: No   Drug use: No       OPHTHALMIC EXAM:  Base Eye Exam     Visual Acuity (Snellen - Linear)       Right Left   Dist [place] 20/30 20/20   Dist ph [place]  20/20          Tonometry (Tonopen, 7:52 AM)       Right Left   Pressure 21 20         Pupils       Dark Light Shape React APD   Right 3 2 Round Minimal None   Left 3 2 Round Minimal None         Visual Fields       Left Right    Full Full         Extraocular Movement       Right Left    Full, Ortho Full, Ortho         Neuro/Psych     Oriented x3: Yes   Mood/Affect: Normal         Dilation     Both eyes: 1.0% Mydriacyl, 2.5% Phenylephrine @ 7:50 AM           Slit Lamp and Fundus Exam     Slit Lamp Exam       Right Left   Lids/Lashes Dermatochalasis - upper lid Dermatochalasis - upper lid, mild MGD   Conjunctiva/Sclera Mild Melanosis Mild Melanosis   Cornea arcus, trace PEE mild arcus, trace PEE   Anterior Chamber deep and clear Deep and quiet   Iris Round and dilated, pigment clump at 0800 midzone Round and moderately dilated   Lens 2-3+ Nuclear sclerosis, 2-3+ Cortical cataract, Pseudoexfoliative material on anterior capsule 2-3+ Nuclear sclerosis, 2-3+ Cortical cataract, early pseudoexfoliation   Anterior Vitreous Mild syneresis, Posterior vitreous detachment Mild Vitreous syneresis, vitreous condensations         Fundus Exam       Right Left   Disc Pink and Sharp, +cupping Pink and Sharp, +cupping   C/D Ratio 0.7 0.7   Macula Flat, good foveal reflex, central IRH - improved, cystic changes centrally - stably improved, RPE mottling and clumping, no discreet targets for focal laser, +focal drusen Flat, good foveal reflex, mild RPE mottling and clumping, No heme or edema   Vessels mild attenuation, mild tortuosity attenuated, Tortuous, mild AV crossing changes   Periphery Attached, No heme Attached, No heme           Refraction     Wearing Rx       Sphere Cylinder Axis   Right [place] +0.50 170   Left -0.75 +0.75 170           IMAGING AND PROCEDURES  Imaging and Procedures for @TODAY @  OCT, Retina - OU - Both Eyes        Right Eye Quality was good. Central Foveal Thickness: 249. Progression has been stable. Findings include normal foveal contour, no IRF, no SRF, intraretinal hyper-reflective material (Stable improvement in IRF/cystic changes, focal IRHM nasal fovea ).   Left  Eye Quality was good. Central Foveal Thickness: 256. Progression has been stable. Findings include no IRF, no SRF, abnormal foveal contour (stable improvement in cystic changes nasal fovea).   Notes *Images captured and stored on drive  Diagnosis / Impression:  OD: stable improvement in IRF/cystic changes, focal IRHM nasal fovea OS: NFP, stable improvement in cystic changes nasal fovea   Clinical management:  See below  Abbreviations: NFP - Normal foveal profile. CME - cystoid macular edema. PED - pigment epithelial detachment. IRF - intraretinal fluid. SRF - subretinal fluid. EZ - ellipsoid zone. ERM - epiretinal membrane. ORA - outer retinal atrophy. ORT - outer retinal tubulation. SRHM - subretinal hyper-reflective material             ASSESSMENT/PLAN:   ICD-10-CM   1. Essential hypertension  I10     2. Hypertensive retinopathy of both eyes  H35.033 OCT, Retina - OU - Both Eyes    3. Retinal hemorrhage of both eyes  H35.63     4. Branch retinal vein occlusion of right eye with macular edema  H34.8310     5. Combined forms of age-related cataract of both eyes  H25.813     6. 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 Hypertensive retinopathy with retinal hemorrhage OU and retinal edema OD  - s/p IVA OD #1 (10.11.22), #2 (11.08.22), #3 (12.06.22), #4 (1.3.23) -- IVA resistance  - s/p IVE OD #1 (sample) 01.31.23, #2 (03.01.23), #3 (03.29.23), #4 (04.20.23), #5 (05.24.23), #6 (06.21.23), #7 (07.26.23), #8 (09.13.23), #9 (11.08.23)  - original OCT showed focal IRF/IRHM OD; OS with mild IRHM, no edema  - FA (01.14.21) shows focal, parafoveal  hyperfluorescent leakage OU (OD >> OS)  - repeat FA 1.11.22 showed mild improvement in perifoveal leakage OD  - BP at initial visit 140s / 99 and 96 in R and L arms respectively  - suspect hemorrhage and edema related to HTN and use of eliquis for A fib  - differential includes mild, old BRVO w/ CME OD -- very similar in appearance to DME, but pt does not carry diagnosis of DM  - BP meds adjusted and BP improved  - exam shows stable improvement in IRF/cystic changes and heme - BCVA OD stably improved to 20/20   - OCT shows OD: stable improvement in IRF/cystic changes, focal IRHM nasal fovea at 3+ mos since last IVA  - pt's daughter reports development of macular edema around same time pt started latanoprost  - ?PGA-induced edema  - stopped latanoprost and started brimonidine BID OU -- no significant improvement in edema  - stopped PF and Prolensa  - recommend holding injection today with follow up in 4-6 months -- recommend PRN tx          - pt in agreement - IVA informed consent obtained and signed, 10.11.22 (OD) - IVE informed consent obtained and re-signed, 01.31.23 (OD) - see procedure note - f/u 4-6 months, sooner prn -- DFE/OCT,  5. 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  - discussed diagnosis and progression  - will refer back to [facility] / Dr. Lucianne Lei for re-evaluation and management  6. 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 Ocular hypertension OU  - IOP 21,20 today  -  started on Latanoprost by Dr. Parke Simmers  - edema OD started about the same time per daughter  - switched from [place] to [place] due to possible macular edema from [place]  - continue brimonidine bid OU  Ophthalmic Meds Ordered this visit:  No orders of the defined types were placed in this encounter.    Return for f/u 4-6 months, HTN ret OU,  DFE, OCT.  There are no Patient Instructions on file for this visit.  Explained the diagnoses, plan, and follow up with the patient and they expressed understanding.  Patient expressed understanding of the importance of proper follow up care.   This document serves as a record of services personally performed by Gardiner Sleeper, MD, PhD. It was created on their behalf by Orvan Falconer, an ophthalmic technician. The creation of this record is the provider's dictation and/or activities during the visit.    Electronically signed by: Orvan Falconer, OA, 08/30/22  8:33 AM  This document serves as a record of services personally performed by Gardiner Sleeper, MD, PhD. It was created on their behalf by San Jetty. Owens Shark, OA an ophthalmic technician. The creation of this record is the provider's dictation and/or activities during the visit.    Electronically signed by: San Jetty. Owens Shark, [place] 03.27.2024 8:33 AM  Gardiner Sleeper, M.D., Ph.D. Diseases & Surgery of the Retina and Vitreous [facility]  I have reviewed the above documentation for accuracy and completeness, and I agree with the above. Gardiner Sleeper, M.D., Ph.D. 08/30/22 8:39 AM   Abbreviations: M myopia (nearsighted); A astigmatism; H hyperopia (farsighted); P presbyopia; Mrx spectacle prescription;  CTL contact lenses; OD right eye; OS left eye; OU both eyes  XT exotropia; ET esotropia; PEK punctate epithelial keratitis; PEE punctate epithelial erosions; DES dry eye syndrome; MGD meibomian gland dysfunction; ATs artificial tears; PFAT's preservative free artificial tears; [place] nuclear sclerotic cataract; PSC posterior subcapsular cataract; ERM epi-retinal membrane; PVD posterior vitreous detachment; RD retinal detachment; DM diabetes mellitus; DR diabetic retinopathy; NPDR non-proliferative diabetic retinopathy; PDR proliferative diabetic retinopathy; CSME clinically significant macular edema; DME diabetic macular  edema; dbh dot blot hemorrhages; CWS cotton wool spot; POAG primary open angle glaucoma; C/D cup-to-disc ratio; HVF humphrey visual field; GVF goldmann visual field; OCT optical coherence tomography; IOP intraocular pressure; BRVO Branch retinal vein occlusion; CRVO central retinal vein occlusion; CRAO central retinal artery occlusion; BRAO branch retinal artery occlusion; RT retinal tear; SB scleral buckle; PPV pars plana vitrectomy; VH Vitreous hemorrhage; PRP panretinal laser photocoagulation; IVK intravitreal kenalog; VMT vitreomacular traction; MH Macular hole;  NVD neovascularization of the disc; NVE neovascularization elsewhere; AREDS age related eye disease study; ARMD age related macular degeneration; POAG primary open angle glaucoma; EBMD epithelial/anterior basement membrane dystrophy; ACIOL anterior chamber intraocular lens; IOL intraocular lens; PCIOL posterior chamber intraocular lens; Phaco/IOL phacoemulsification with intraocular lens placement; [place] photorefractive keratectomy; LASIK laser assisted in situ keratomileusis; HTN hypertension; DM diabetes mellitus; COPD chronic obstructive pulmonary disease

## 2022-08-25 ENCOUNTER — Other Ambulatory Visit: Payer: Self-pay | Admitting: Internal Medicine

## 2022-08-25 DIAGNOSIS — N2889 Other specified disorders of kidney and ureter: Secondary | ICD-10-CM

## 2022-08-30 ENCOUNTER — Encounter (INDEPENDENT_AMBULATORY_CARE_PROVIDER_SITE_OTHER): Payer: Self-pay | Admitting: Ophthalmology

## 2022-08-30 ENCOUNTER — Ambulatory Visit (INDEPENDENT_AMBULATORY_CARE_PROVIDER_SITE_OTHER): Payer: Medicare Other | Admitting: Ophthalmology

## 2022-08-30 DIAGNOSIS — H268 Other specified cataract: Secondary | ICD-10-CM

## 2022-08-30 DIAGNOSIS — H35033 Hypertensive retinopathy, bilateral: Secondary | ICD-10-CM

## 2022-08-30 DIAGNOSIS — H25813 Combined forms of age-related cataract, bilateral: Secondary | ICD-10-CM | POA: Diagnosis not present

## 2022-08-30 DIAGNOSIS — I10 Essential (primary) hypertension: Secondary | ICD-10-CM | POA: Diagnosis not present

## 2022-08-30 DIAGNOSIS — H3563 Retinal hemorrhage, bilateral: Secondary | ICD-10-CM | POA: Diagnosis not present

## 2022-08-30 DIAGNOSIS — H34831 Tributary (branch) retinal vein occlusion, right eye, with macular edema: Secondary | ICD-10-CM | POA: Diagnosis not present

## 2022-08-30 DIAGNOSIS — H40053 Ocular hypertension, bilateral: Secondary | ICD-10-CM | POA: Diagnosis not present

## 2022-09-06 ENCOUNTER — Other Ambulatory Visit: Payer: Self-pay | Admitting: Internal Medicine

## 2022-09-07 ENCOUNTER — Other Ambulatory Visit: Payer: Self-pay

## 2022-09-07 MED ORDER — CARVEDILOL 6.25 MG PO TABS: 6.2500 mg | ORAL_TABLET | Freq: Two times a day (BID) | ORAL | 2 refills | Status: DC

## 2022-10-05 ENCOUNTER — Other Ambulatory Visit: Payer: Medicare Other

## 2022-10-11 DIAGNOSIS — H401421 Capsular glaucoma with pseudoexfoliation of lens, left eye, mild stage: Secondary | ICD-10-CM | POA: Diagnosis not present

## 2022-10-11 DIAGNOSIS — H35073 Retinal telangiectasis, bilateral: Secondary | ICD-10-CM | POA: Diagnosis not present

## 2022-10-11 DIAGNOSIS — H401412 Capsular glaucoma with pseudoexfoliation of lens, right eye, moderate stage: Secondary | ICD-10-CM | POA: Diagnosis not present

## 2022-10-11 DIAGNOSIS — H25811 Combined forms of age-related cataract, right eye: Secondary | ICD-10-CM | POA: Diagnosis not present

## 2022-10-11 DIAGNOSIS — H25813 Combined forms of age-related cataract, bilateral: Secondary | ICD-10-CM | POA: Diagnosis not present

## 2022-10-22 ENCOUNTER — Ambulatory Visit
Admission: RE | Admit: 2022-10-22 | Discharge: 2022-10-22 | Disposition: A | Discharge status: Home / Self Care | Payer: Medicare Other | Source: Ambulatory Visit | Attending: Internal Medicine | Admitting: Internal Medicine

## 2022-10-22 DIAGNOSIS — N2889 Other specified disorders of kidney and ureter: Secondary | ICD-10-CM

## 2022-10-22 DIAGNOSIS — N281 Cyst of kidney, acquired: Secondary | ICD-10-CM | POA: Diagnosis not present

## 2022-11-15 DIAGNOSIS — H401421 Capsular glaucoma with pseudoexfoliation of lens, left eye, mild stage: Secondary | ICD-10-CM | POA: Diagnosis not present

## 2022-11-15 DIAGNOSIS — H401412 Capsular glaucoma with pseudoexfoliation of lens, right eye, moderate stage: Secondary | ICD-10-CM | POA: Diagnosis not present

## 2022-12-05 NOTE — Progress Notes (Unsigned)
Cardiology Office Note:    Date:  12/06/2022  ID:  Billy Harrell, DOB [birth year], MRN [id number] PCP: Daisy Floro, MD  [facility] Providers Cardiologist:  Dietrich Pates, MD       Patient Profile:      Hypertension BP monitor 07/2019: avg 118/79 RA [place] 11/07/17: no RAS bilat Permanent atrial fibrillation S/p DCCV 11/18 >> ERAF CHADS2-VASc=2 (HTN, age) >> Apixaban Chronic kidney disease  ACE inhibitor and HCTZ d/c'd in the past due to worsening Creatinine RA [place] neg for RAS 11/2017 Aortic insufficiency  TTE 04/06/17: Mild focal basal septal hypertrophy, EF 55-60, no RWMA, mild AI, mildly dilated Ao root (42 mm), mild MR, mod LAE, mod RAE, mild TR, PASP 32  TTE 04/13/2022: EF 55-60, moderate to severe AI, GLS -18.6, normal RVSF, normal PASP, RVSP 33, BAE, mild-moderate MR, moderate TR, aortic root 43 mm, ascending aorta 41 mm, RAP 8 TEE 04/21/2022: EF 60-65, no RWMA, normal RVSF, severe BAE, trivial MR, mild to moderate AI, mild dilation of ascending aorta (42 mm), mild dilation of aortic root (39 mm), RAP 3 Myoview 04/13/2022: Normal perfusion, low risk      History of Present Illness:   Billy Harrell is a 72 y.o. male who returns for follow-up on hypertension, atrial fibrillation, aortic insufficiency.  He was last seen in November 2023.  Transthoracic echo demonstrated moderate to severe AI.  Follow-up TEE demonstrated mild to moderate AI. He is here alone. He is doing well w/o chest pain, shortness of breath, syncope, orthopnea, edema. He has some upper back pain that is not related to exertion. He feels fatigued.   Review of Systems  Gastrointestinal:  Negative for hematochezia and melena.  Genitourinary:  Negative for hematuria.       Studies Reviewed:       Risk Assessment/Calculations:    CHA2DS2-VASc Score = 2   This indicates a 2.2% annual risk of stroke. The patient's score is based upon: CHF History: 0 HTN History: 1 Diabetes History: 0 Stroke History: 0 Vascular  Disease History: 0 Age Score: 1 Gender Score: 0            Physical Exam:   VS:  BP 110/78   Pulse 68   Ht 5\' 8"  (1.727 m)   Wt 174 lb (78.9 kg)   SpO2 96%   BMI 26.46 kg/m    Wt Readings from Last 3 Encounters:  12/06/22 174 lb (78.9 kg)  04/21/22 171 lb (77.6 kg)  04/19/22 170 lb 12.8 oz (77.5 kg)    Constitutional:      Appearance: Healthy appearance. Not in distress.  Neck:     Vascular: JVD normal.  Pulmonary:     Breath sounds: Normal breath sounds. No wheezing. No rales.  Cardiovascular:     Normal rate. Irregularly irregular rhythm.     Murmurs: There is no murmur.  Edema:    Peripheral edema absent.  Abdominal:     Palpations: Abdomen is soft.      ASSESSMENT AND PLAN:   Persistent atrial fibrillation (HCC) Rate controlled. He is tolerating anticoagulation. Labs from primary care reviewed. Creatinine was stable at 1.58 and Hgb was normal at 13.4 in Jan 2024. Based upon age, weight, continue Eliquis 5 mg twice daily. Continue Coreg 6.25 mg twice daily. Follow up 6 mos.   Essential hypertension BP controlled. Continue Amlodipine 5 mg once daily, Coreg 6.25 mg twice daily.   Nonrheumatic aortic (valve) insufficiency Mild to mod AI by TEE  in 04/2022. Repeat TTE planned for 04/2023.  CKD (chronic kidney disease) stage 3, GFR 30-59 ml/min (HCC) Recent creatinine stable. He is followed by nephrology.  Pure hypercholesterolemia Labs from primary care from 06/2022 were reviewed. Total chol 176, HDL 37, LDL 127, Trig 61. 10 year ASCVD risk is 19.8%. It is reasonable to take a mod intensity statin. We discussed the indications for this and an option to obtain a CAC score to help decide. He is comfortable taking a statin without getting a CAC score. I would like to see his LDL < 100, ideally < 70.  Crestor 10 mg once daily CMET, Lipids in 3 mos      Dispo:  Return in about 6 months (around 06/08/2023) for Routine Follow Up, w/ Dr. Tenny Craw, or Tereso Newcomer,  PA-C.  Signed, Tereso Newcomer, PA-C

## 2022-12-06 ENCOUNTER — Encounter: Payer: Self-pay | Admitting: Physician Assistant

## 2022-12-06 ENCOUNTER — Ambulatory Visit: Payer: Medicare Other | Attending: Physician Assistant | Admitting: Physician Assistant

## 2022-12-06 VITALS — BP 110/78 | HR 68 | Ht 68.0 in | Wt 174.0 lb

## 2022-12-06 DIAGNOSIS — E7800 Pure hypercholesterolemia, unspecified: Secondary | ICD-10-CM | POA: Diagnosis not present

## 2022-12-06 DIAGNOSIS — N1832 Chronic kidney disease, stage 3b: Secondary | ICD-10-CM

## 2022-12-06 DIAGNOSIS — I4819 Other persistent atrial fibrillation: Secondary | ICD-10-CM | POA: Diagnosis not present

## 2022-12-06 DIAGNOSIS — I10 Essential (primary) hypertension: Secondary | ICD-10-CM

## 2022-12-06 DIAGNOSIS — I351 Nonrheumatic aortic (valve) insufficiency: Secondary | ICD-10-CM | POA: Diagnosis not present

## 2022-12-06 HISTORY — DX: Pure hypercholesterolemia, unspecified: E78.00

## 2022-12-06 MED ORDER — ROSUVASTATIN CALCIUM 10 MG PO TABS: 10.0000 mg | ORAL_TABLET | Freq: Every day | ORAL | 3 refills | Status: DC

## 2022-12-06 NOTE — Assessment & Plan Note (Signed)
BP controlled. Continue Amlodipine 5 mg once daily, Coreg 6.25 mg twice daily.

## 2022-12-06 NOTE — Assessment & Plan Note (Signed)
Mild to mod AI by TEE in 04/2022. Repeat TTE planned for 04/2023.

## 2022-12-06 NOTE — Assessment & Plan Note (Addendum)
Labs from primary care from 06/2022 were reviewed. Total chol 176, HDL 37, LDL 127, Trig 61. 10 year ASCVD risk is 19.8%. It is reasonable to take a mod intensity statin. We discussed the indications for this and an option to obtain a CAC score to help decide. He is comfortable taking a statin without getting a CAC score. I would like to see his LDL < 100, ideally < 70.  Crestor 10 mg once daily CMET, Lipids in 3 mos

## 2022-12-06 NOTE — Patient Instructions (Signed)
Medication Instructions:  Your physician has recommended you make the following change in your medication:   START Rosuvastatin (Crestor) 10 mg taking 1 daily   *If you need a refill on your cardiac medications before your next appointment, please call your pharmacy*   Lab Work: 3 MONTHS:  FASTING LIPID & CMET  If you have labs (blood work) drawn today and your tests are completely normal, you will receive your results only by: MyChart Message (if you have MyChart) OR A paper copy in the mail If you have any lab test that is abnormal or we need to change your treatment, we will call you to review the results.   Testing/Procedures: None ordered today, but you are due for a Echocardiogram in November.   Follow-Up: At [facility], you and your health needs are our priority.  As part of our continuing mission to provide you with exceptional heart care, we have created designated Provider Care Teams.  These Care Teams include your primary Cardiologist (physician) and Advanced Practice Providers (APPs -  Physician Assistants and Nurse Practitioners) who all work together to provide you with the care you need, when you need it.  We recommend signing up for the patient portal called "MyChart".  Sign up information is provided on this After Visit Summary.  MyChart is used to connect with patients for Virtual Visits (Telemedicine).  Patients are able to view lab/test results, encounter notes, upcoming appointments, etc.  Non-urgent messages can be sent to your provider as well.   To learn more about what you can do with MyChart, go to [email or web address].    Your next appointment:   6 month(s)  Provider:   Dietrich Pates, MD  or Tereso Newcomer, PA-C         Other Instructions

## 2022-12-06 NOTE — Assessment & Plan Note (Addendum)
Rate controlled. He is tolerating anticoagulation. Labs from primary care reviewed. Creatinine was stable at 1.58 and Hgb was normal at 13.4 in Jan 2024. Based upon age, weight, continue Eliquis 5 mg twice daily. Continue Coreg 6.25 mg twice daily. Follow up 6 mos.

## 2022-12-06 NOTE — Assessment & Plan Note (Signed)
Recent creatinine stable. He is followed by nephrology.

## 2022-12-27 DIAGNOSIS — H25811 Combined forms of age-related cataract, right eye: Secondary | ICD-10-CM | POA: Diagnosis not present

## 2022-12-27 DIAGNOSIS — H268 Other specified cataract: Secondary | ICD-10-CM | POA: Diagnosis not present

## 2023-01-03 ENCOUNTER — Ambulatory Visit
Admission: RE | Admit: 2023-01-03 | Discharge: 2023-01-03 | Disposition: A | Discharge status: Home / Self Care | Payer: Medicare Other | Source: Ambulatory Visit | Attending: Physician Assistant | Admitting: Physician Assistant

## 2023-01-03 ENCOUNTER — Other Ambulatory Visit: Payer: Self-pay | Admitting: Physician Assistant

## 2023-01-03 DIAGNOSIS — N1831 Chronic kidney disease, stage 3a: Secondary | ICD-10-CM | POA: Diagnosis not present

## 2023-01-03 DIAGNOSIS — D6869 Other thrombophilia: Secondary | ICD-10-CM | POA: Diagnosis not present

## 2023-01-03 DIAGNOSIS — R634 Abnormal weight loss: Secondary | ICD-10-CM

## 2023-01-03 DIAGNOSIS — I10 Essential (primary) hypertension: Secondary | ICD-10-CM | POA: Diagnosis not present

## 2023-01-03 DIAGNOSIS — I77810 Thoracic aortic ectasia: Secondary | ICD-10-CM | POA: Diagnosis not present

## 2023-01-03 DIAGNOSIS — I4821 Permanent atrial fibrillation: Secondary | ICD-10-CM | POA: Diagnosis not present

## 2023-01-08 DIAGNOSIS — M549 Dorsalgia, unspecified: Secondary | ICD-10-CM | POA: Diagnosis not present

## 2023-01-08 DIAGNOSIS — I129 Hypertensive chronic kidney disease with stage 1 through stage 4 chronic kidney disease, or unspecified chronic kidney disease: Secondary | ICD-10-CM | POA: Diagnosis not present

## 2023-01-08 DIAGNOSIS — N1832 Chronic kidney disease, stage 3b: Secondary | ICD-10-CM | POA: Diagnosis not present

## 2023-01-08 DIAGNOSIS — R0602 Shortness of breath: Secondary | ICD-10-CM | POA: Diagnosis not present

## 2023-01-08 DIAGNOSIS — N2889 Other specified disorders of kidney and ureter: Secondary | ICD-10-CM | POA: Diagnosis not present

## 2023-01-08 DIAGNOSIS — I4891 Unspecified atrial fibrillation: Secondary | ICD-10-CM | POA: Diagnosis not present

## 2023-01-10 ENCOUNTER — Ambulatory Visit: Payer: Medicare Other | Admitting: Physician Assistant

## 2023-01-10 DIAGNOSIS — Z1211 Encounter for screening for malignant neoplasm of colon: Secondary | ICD-10-CM | POA: Diagnosis not present

## 2023-01-10 DIAGNOSIS — E860 Dehydration: Secondary | ICD-10-CM | POA: Diagnosis not present

## 2023-01-17 ENCOUNTER — Ambulatory Visit: Payer: Medicare Other | Admitting: Physician Assistant

## 2023-01-22 DIAGNOSIS — I129 Hypertensive chronic kidney disease with stage 1 through stage 4 chronic kidney disease, or unspecified chronic kidney disease: Secondary | ICD-10-CM | POA: Diagnosis not present

## 2023-01-22 DIAGNOSIS — H524 Presbyopia: Secondary | ICD-10-CM | POA: Diagnosis not present

## 2023-02-02 DIAGNOSIS — R944 Abnormal results of kidney function studies: Secondary | ICD-10-CM | POA: Diagnosis not present

## 2023-02-08 ENCOUNTER — Other Ambulatory Visit: Payer: Self-pay | Admitting: Internal Medicine

## 2023-02-28 ENCOUNTER — Encounter (INDEPENDENT_AMBULATORY_CARE_PROVIDER_SITE_OTHER): Payer: Medicare Other | Admitting: Ophthalmology

## 2023-02-28 DIAGNOSIS — H35033 Hypertensive retinopathy, bilateral: Secondary | ICD-10-CM

## 2023-02-28 DIAGNOSIS — H3563 Retinal hemorrhage, bilateral: Secondary | ICD-10-CM

## 2023-02-28 DIAGNOSIS — H25813 Combined forms of age-related cataract, bilateral: Secondary | ICD-10-CM

## 2023-02-28 DIAGNOSIS — I10 Essential (primary) hypertension: Secondary | ICD-10-CM

## 2023-02-28 DIAGNOSIS — H268 Other specified cataract: Secondary | ICD-10-CM

## 2023-02-28 DIAGNOSIS — H34831 Tributary (branch) retinal vein occlusion, right eye, with macular edema: Secondary | ICD-10-CM

## 2023-02-28 DIAGNOSIS — H40053 Ocular hypertension, bilateral: Secondary | ICD-10-CM

## 2023-03-01 NOTE — Progress Notes (Signed)
[facility] - Clinic Note  03/02/2023     CHIEF COMPLAINT Patient presents for Retina Follow Up  HISTORY OF PRESENT ILLNESS: Billy Harrell is a 72 y.o. male who presents to the clinic today for:   HPI     Retina Follow Up   Patient presents with  Other.  In both eyes.  This started 6 months ago.  I, the attending physician,  performed the HPI with the patient and updated documentation appropriately.        Comments   Patient here for 6 months retina follow up for Htn Ret OU. Patient states vision is ok. Had cataract surgery 2 months ago OD. Has just a little bit eye pain OD. Using blue top BID OU.       Last edited by Rennis Chris, MD on 03/02/2023  3:32 PM.      Referring physician: Diona Foley, MD [address] [place],  [place] [address]  HISTORICAL INFORMATION:   Selected notes from the MEDICAL RECORD NUMBER Referred by Dr. Krista Blue for retina eval   CURRENT MEDICATIONS: Current Outpatient Medications (Ophthalmic Drugs)  Medication Sig   dorzolamide-timolol (COSOPT) 22.3-6.8 MG/ML ophthalmic solution Place 1 drop into both eyes 2 (two) times daily.   No current facility-administered medications for this visit. (Ophthalmic Drugs)   Current Outpatient Medications (Other)  Medication Sig   amLODipine (NORVASC) 5 MG tablet Take 1 tablet by mouth once daily   apixaban (ELIQUIS) 5 MG TABS tablet Take 1 tablet by mouth twice daily   carvedilol (COREG) 6.25 MG tablet Take 1 tablet (6.25 mg total) by mouth 2 (two) times daily.   fluticasone (FLONASE) 50 MCG/ACT nasal spray Place 1 spray into both nostrils as needed for allergies or rhinitis.   Multiple Vitamin (MULTIVITAMIN WITH MINERALS) TABS tablet Take 1 tablet by mouth in the morning.   Omega-3 Fatty Acids (FISH OIL) 1000 MG CAPS Take 1,000 mg by mouth in the morning.   rosuvastatin (CRESTOR) 10 MG tablet Take 1 tablet (10 mg total) by mouth daily.   No current facility-administered  medications for this visit. (Other)   REVIEW OF SYSTEMS: ROS   Positive for: Genitourinary, Endocrine, Cardiovascular, Eyes Negative for: Constitutional, Gastrointestinal, Neurological, Skin, Musculoskeletal, HENT, Respiratory, Psychiatric, Allergic/Imm, Heme/Lymph Last edited by Laddie Aquas, COA on 03/02/2023 12:34 PM.     ALLERGIES No Known Allergies  PAST MEDICAL HISTORY Past Medical History:  Diagnosis Date   Aortic insufficiency 04/17/2022   Echocardiogram 04/2022: EF 55-60, no RWMA, GLS -18.6, normal RVSF, normal PASP (RVSP 33), mild LAE, mod RAE, mild to mod MR, mod TR, mod to severe AI, AV sclerosis w/o AS, mod PI, mild dilation of aortic root (43 mm), mild dilation of ascending aorta (41 mm), RAP 8   Cataract    Mixed form OU   Chronic kidney disease    ACE inhibitor and HCTZ DCd in past due to rising Creatinine   History of nuclear stress test    Myoview 04/2022: EF 65, normal perfusion, low risk   Hypertension    [place] 6/19:  neg for RA stenosis bilat; normal mesenteric arteries   Hypertensive retinopathy    OU   Permanent atrial fibrillation    s/p DCCV 04/2017 >> failed // CHADS2-VASc=2 >> Apixaban Rx   Pure hypercholesterolemia 12/06/2022   Past Surgical History:  Procedure Laterality Date   CARDIOVERSION N/A 04/30/2017   Procedure: CARDIOVERSION;  Surgeon: Wendall Stade, MD;  Location: [facility] ENDOSCOPY;  Service: Cardiovascular;  Laterality: N/A;   CATARACT EXTRACTION     TEE WITHOUT CARDIOVERSION N/A 04/21/2022   Procedure: TRANSESOPHAGEAL ECHOCARDIOGRAM (TEE);  Surgeon: Thurmon Fair, MD;  Location: [facility] ENDOSCOPY;  Service: Cardiovascular;  Laterality: N/A;   FAMILY HISTORY History reviewed. No pertinent family history.  SOCIAL HISTORY Social History   Tobacco Use   Smoking status: Never   Smokeless tobacco: Never  Vaping Use   Vaping status: Never Used  Substance Use Topics   Alcohol use: No   Drug use: No       OPHTHALMIC EXAM:  Base Eye  Exam     Visual Acuity (Snellen - Linear)       Right Left   Dist Cement 20/30 20/20 -1   Dist ph  20/25 -2          Tonometry (Tonopen, 12:31 PM)       Right Left   Pressure 12 13         Pupils       Dark Light Shape React APD   Right 3 2 Round Minimal None   Left 3 2 Round Minimal None         Visual Fields (Counting fingers)       Left Right    Full Full         Extraocular Movement       Right Left    Full, Ortho Full, Ortho         Neuro/Psych     Oriented x3: Yes   Mood/Affect: Normal         Dilation     Both eyes: 1.0% Mydriacyl, 2.5% Phenylephrine @ 12:31 PM           Slit Lamp and Fundus Exam     Slit Lamp Exam       Right Left   Lids/Lashes Dermatochalasis - upper lid Dermatochalasis - upper lid, mild MGD   Conjunctiva/Sclera Mild Melanosis Mild Melanosis   Cornea arcus, trace PEE, well healed cataract wound mild arcus, trace PEE   Anterior Chamber deep and clear Deep and quiet   Iris Round and dilated, pigment clump at 0800 midzone Round and moderately dilated   Lens Vivity PC IOL in good position 2-3+ Nuclear sclerosis, 2-3+ Cortical cataract, +pseudoexfoliation   Anterior Vitreous Mild syneresis, Posterior vitreous detachment Mild Vitreous syneresis, vitreous condensations         Fundus Exam       Right Left   Disc Pink and Sharp, +cupping Pink and Sharp, +cupping   C/D Ratio 0.7 0.7   Macula Flat, good foveal reflex, central IRH - improved, cystic changes centrally - stably improved, RPE mottling and clumping, no discreet targets for focal laser, +focal drusen Flat, good foveal reflex, mild RPE mottling and clumping, No heme or edema   Vessels attenuated, mild tortuosity attenuated, mild tortuosity   Periphery Attached, No heme Attached, No heme           Refraction     Wearing Rx       Sphere Cylinder Axis   Right [place] +0.50 170   Left -0.75 +0.75 170           IMAGING AND PROCEDURES  Imaging and  Procedures for @TODAY @  OCT, Retina - OU - Both Eyes       Right Eye Quality was good. Central Foveal Thickness: 268. Progression has been stable. Findings include normal foveal contour, no IRF, no SRF, intraretinal hyper-reflective material (  Stable improvement in IRF/cystic changes and focal IRHM nasal fovea ).   Left Eye Quality was good. Central Foveal Thickness: 258. Progression has been stable. Findings include no IRF, no SRF, abnormal foveal contour.   Notes *Images captured and stored on drive  Diagnosis / Impression:  OD: h/o BRVO - Stable improvement in IRF/cystic changes and focal IRHM nasal fovea  OS: NFP; no IRF/SRF   Clinical management:  See below  Abbreviations: NFP - Normal foveal profile. CME - cystoid macular edema. PED - pigment epithelial detachment. IRF - intraretinal fluid. SRF - subretinal fluid. EZ - ellipsoid zone. ERM - epiretinal membrane. ORA - outer retinal atrophy. ORT - outer retinal tubulation. SRHM - subretinal hyper-reflective material            ASSESSMENT/PLAN:   ICD-10-CM   1. Essential hypertension  I10     2. Hypertensive retinopathy of both eyes  H35.033     3. Retinal hemorrhage of both eyes  H35.63     4. Branch retinal vein occlusion of right eye with macular edema  H34.8310 OCT, Retina - OU - Both Eyes    5. Combined forms of age-related cataract of left eye  H25.812     6. Pseudophakia  Z96.1     7. 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 Hypertensive retinopathy with retinal hemorrhage OU and retinal edema OD  - s/p IVA OD #1 (10.11.22), #2 (11.08.22), #3 (12.06.22), #4 (1.3.23) -- IVA resistance  - s/p IVE OD #1 (sample) 01.31.23, #2 (03.01.23), #3 (03.29.23), #4 (04.20.23), #5 (05.24.23), #6 (06.21.23), #7 (07.26.23), #8 (09.13.23), #9 (11.08.23)  - original OCT showed focal IRF/IRHM OD; OS with mild IRHM, no edema  - FA (01.14.21) shows focal,  parafoveal hyperfluorescent leakage OU (OD >> OS)  - repeat FA 1.11.22 showed mild improvement in perifoveal leakage OD  - BP at initial visit 140s / 99 and 96 in R and L arms respectively  - suspect hemorrhage and edema related to HTN and use of eliquis for A fib  - differential includes mild, old BRVO w/ CME OD -- very similar in appearance to DME, but pt does not carry diagnosis of DM  - BP meds adjusted and BP improved  - exam shows stable improvement in IRF/cystic changes and heme - BCVA OD 20/25  - OCT shows OD: stable improvement in IRF/cystic changes and focal IRHM nasal fovea at 10+ mos since last IVE  - pt's daughter reports development of macular edema around same time pt started latanoprost  - ?PGA-induced edema  - stopped latanoprost and started brimonidine BID OU -- no significant improvement in edema  - stopped PF and Prolensa  - recommend holding injection today with follow up in 9 months -- recommend PRN tx          - pt in agreement - see procedure note - f/u 9 months, sooner prn -- DFE/OCT,  5. 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  - discussed diagnosis and progression  - under the expert management of Dr. Zenaida Niece  6. Pseudophakia OD  - s/p CE/IOL OD (Vivity IOL - Dr. Zenaida Niece, 07.24.2024)  - IOL in good position, doing well  - monitor  7. 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 Ocular hypertension OU  -  IOP 12,13 today  - started on Latanoprost by Dr. Krista Blue  - edema OD started about the same time per daughter  - switched from Latan to brim due to possible macular edema from latan  - currently on Cosopt bid OU per Dr. Zenaida Niece  Ophthalmic Meds Ordered this visit:  No orders of the defined types were placed in this encounter.    Return in about 9 months (around 11/30/2023) for f/u HTN ret OU, DFE, OCT.  There are no Patient Instructions on  file for this visit.  Explained the diagnoses, plan, and follow up with the patient and they expressed understanding.  Patient expressed understanding of the importance of proper follow up care.   This document serves as a record of services personally performed by Karie Chimera, MD, PhD. It was created on their behalf by Glee Arvin. Manson Passey, OA an ophthalmic technician. The creation of this record is the provider's dictation and/or activities during the visit.    Electronically signed by: Glee Arvin. Manson Passey, OA 03/02/23 3:35 PM   Karie Chimera, M.D., Ph.D. Diseases & Surgery of the Retina and Vitreous [facility]  I have reviewed the above documentation for accuracy and completeness, and I agree with the above. Karie Chimera, M.D., Ph.D. 03/02/23 3:39 PM   Abbreviations: M myopia (nearsighted); A astigmatism; H hyperopia (farsighted); P presbyopia; Mrx spectacle prescription;  CTL contact lenses; OD right eye; OS left eye; OU both eyes  XT exotropia; ET esotropia; PEK punctate epithelial keratitis; PEE punctate epithelial erosions; DES dry eye syndrome; MGD meibomian gland dysfunction; ATs artificial tears; PFAT's preservative free artificial tears; NSC nuclear sclerotic cataract; PSC posterior subcapsular cataract; ERM epi-retinal membrane; PVD posterior vitreous detachment; RD retinal detachment; DM diabetes mellitus; DR diabetic retinopathy; NPDR non-proliferative diabetic retinopathy; PDR proliferative diabetic retinopathy; CSME clinically significant macular edema; DME diabetic macular edema; dbh dot blot hemorrhages; CWS cotton wool spot; POAG primary open angle glaucoma; C/D cup-to-disc ratio; HVF humphrey visual field; GVF goldmann visual field; OCT optical coherence tomography; IOP intraocular pressure; BRVO Branch retinal vein occlusion; CRVO central retinal vein occlusion; CRAO central retinal artery occlusion; BRAO branch retinal artery occlusion; RT retinal tear;  SB scleral buckle; PPV pars plana vitrectomy; VH Vitreous hemorrhage; PRP panretinal laser photocoagulation; IVK intravitreal kenalog; VMT vitreomacular traction; MH Macular hole;  NVD neovascularization of the disc; NVE neovascularization elsewhere; AREDS age related eye disease study; ARMD age related macular degeneration; POAG primary open angle glaucoma; EBMD epithelial/anterior basement membrane dystrophy; ACIOL anterior chamber intraocular lens; IOL intraocular lens; PCIOL posterior chamber intraocular lens; Phaco/IOL phacoemulsification with intraocular lens placement; PRK photorefractive keratectomy; LASIK laser assisted in situ keratomileusis; HTN hypertension; DM diabetes mellitus; COPD chronic obstructive pulmonary disease

## 2023-03-02 ENCOUNTER — Ambulatory Visit (INDEPENDENT_AMBULATORY_CARE_PROVIDER_SITE_OTHER): Payer: Medicare Other | Admitting: Ophthalmology

## 2023-03-02 ENCOUNTER — Encounter (INDEPENDENT_AMBULATORY_CARE_PROVIDER_SITE_OTHER): Payer: Medicare Other | Admitting: Ophthalmology

## 2023-03-02 ENCOUNTER — Encounter (INDEPENDENT_AMBULATORY_CARE_PROVIDER_SITE_OTHER): Payer: Self-pay | Admitting: Ophthalmology

## 2023-03-02 DIAGNOSIS — H34831 Tributary (branch) retinal vein occlusion, right eye, with macular edema: Secondary | ICD-10-CM

## 2023-03-02 DIAGNOSIS — I10 Essential (primary) hypertension: Secondary | ICD-10-CM | POA: Diagnosis not present

## 2023-03-02 DIAGNOSIS — H3563 Retinal hemorrhage, bilateral: Secondary | ICD-10-CM | POA: Diagnosis not present

## 2023-03-02 DIAGNOSIS — H35033 Hypertensive retinopathy, bilateral: Secondary | ICD-10-CM

## 2023-03-02 DIAGNOSIS — H25812 Combined forms of age-related cataract, left eye: Secondary | ICD-10-CM

## 2023-03-02 DIAGNOSIS — H268 Other specified cataract: Secondary | ICD-10-CM

## 2023-03-02 DIAGNOSIS — Z961 Presence of intraocular lens: Secondary | ICD-10-CM

## 2023-03-02 DIAGNOSIS — H40053 Ocular hypertension, bilateral: Secondary | ICD-10-CM | POA: Diagnosis not present

## 2023-03-02 DIAGNOSIS — H25813 Combined forms of age-related cataract, bilateral: Secondary | ICD-10-CM

## 2023-03-07 ENCOUNTER — Ambulatory Visit: Payer: Medicare Other | Attending: Physician Assistant

## 2023-03-07 DIAGNOSIS — I4819 Other persistent atrial fibrillation: Secondary | ICD-10-CM | POA: Diagnosis not present

## 2023-03-07 DIAGNOSIS — I10 Essential (primary) hypertension: Secondary | ICD-10-CM

## 2023-03-07 DIAGNOSIS — I351 Nonrheumatic aortic (valve) insufficiency: Secondary | ICD-10-CM | POA: Diagnosis not present

## 2023-03-08 ENCOUNTER — Other Ambulatory Visit: Payer: Medicare Other

## 2023-03-08 LAB — COMPREHENSIVE METABOLIC PANEL
ALT: 15 [IU]/L (ref 0–44)
AST: 20 [IU]/L (ref 0–40)
Albumin: 4.3 g/dL (ref 3.8–4.8)
Alkaline Phosphatase: 66 [IU]/L (ref 44–121)
BUN/Creatinine Ratio: 18 (ref 10–24)
BUN: 33 mg/dL — ABNORMAL HIGH (ref 8–27)
Bilirubin Total: 0.9 mg/dL (ref 0.0–1.2)
CO2: 24 mmol/L (ref 20–29)
Calcium: 9.1 mg/dL (ref 8.6–10.2)
Chloride: 105 mmol/L (ref 96–106)
Creatinine, Ser: 1.81 mg/dL — ABNORMAL HIGH (ref 0.76–1.27)
Globulin, Total: 2.7 g/dL (ref 1.5–4.5)
Glucose: 88 mg/dL (ref 70–99)
Potassium: 4.1 mmol/L (ref 3.5–5.2)
Sodium: 142 mmol/L (ref 134–144)
Total Protein: 7 g/dL (ref 6.0–8.5)
eGFR: 39 mL/min/{1.73_m2} — ABNORMAL LOW (ref >59)

## 2023-03-08 LAB — LIPID PANEL
Chol/HDL Ratio: 3.6 {ratio} (ref 0.0–5.0)
Cholesterol, Total: 131 mg/dL (ref 100–199)
HDL: 36 mg/dL — ABNORMAL LOW (ref >39)
LDL Chol Calc (NIH): 78 mg/dL (ref 0–99)
Triglycerides: 90 mg/dL (ref 0–149)
VLDL Cholesterol Cal: 17 mg/dL (ref 5–40)

## 2023-04-11 ENCOUNTER — Ambulatory Visit (HOSPITAL_COMMUNITY): Payer: Medicare Other | Attending: Physician Assistant

## 2023-04-11 DIAGNOSIS — I351 Nonrheumatic aortic (valve) insufficiency: Secondary | ICD-10-CM | POA: Diagnosis not present

## 2023-04-11 LAB — ECHOCARDIOGRAM COMPLETE
Area-P 1/2: 3.36 cm2
MV M vel: 4.31 m/s
MV Peak grad: 74.3 mm[Hg]
P 1/2 time: 634 ms
S' Lateral: 3.1 cm

## 2023-04-12 ENCOUNTER — Telehealth: Payer: Self-pay | Admitting: *Deleted

## 2023-04-12 DIAGNOSIS — I351 Nonrheumatic aortic (valve) insufficiency: Secondary | ICD-10-CM

## 2023-04-12 NOTE — Telephone Encounter (Signed)
-----   Message from Tereso Newcomer sent at 04/11/2023  1:20 PM EST ----- Results sent to [facility] via MyChart. See MyChart comments below.  Copy sent to PCP as FYI. PLAN:  -Repeat echo in 1 year (mitral regurgitation, aortic insufficiency, dilated aorta)  Mr. Acebo  Your echocardiogram demonstrates normal heart function (ejection fraction).  Mitral regurgitation (leakage) and aortic insufficiency (leakage) are stable.  Your aorta (large artery) is mildly enlarged.  Findings are overall stable.  We will repeat your echocardiogram in 1 year to continue to monitor. Continue current medications/treatment plan and follow up as scheduled.  Tereso Newcomer, PA-C

## 2023-05-31 ENCOUNTER — Other Ambulatory Visit: Payer: Self-pay | Admitting: Internal Medicine

## 2023-06-13 ENCOUNTER — Telehealth: Payer: Self-pay | Admitting: Internal Medicine

## 2023-06-13 NOTE — Telephone Encounter (Signed)
 Spoke with daughter per DPR and she states patient willl not be able to afford his Eliquis . It went from $70 -$300 . Informed her of the deductible change with medicare. She she states he will not be able to afford it. She would like to discuss other options.

## 2023-06-13 NOTE — Telephone Encounter (Signed)
 Pt daughter returning call

## 2023-06-13 NOTE — Telephone Encounter (Signed)
 Pt c/o medication issue:  1. Name of Medication: apixaban  (ELIQUIS ) 5 MG TABS tablet   2. How are you currently taking this medication (dosage and times per day)? N/A  3. Are you having a reaction (difficulty breathing--STAT)? N/A  4. What is your medication issue? Patient's daughter is calling stating the patient advised her when he went to the pharmacy they told him the cost of his eliquis  went up to $300 when he normally pays $70. She is requesting a callback to discuss options to lower cost or be put on an alternative med.   Please advise.

## 2023-06-13 NOTE — Telephone Encounter (Signed)
 Returned call to daughter. She answered then phone went dead.  Tried to reach to call again busy signal x2

## 2023-06-14 NOTE — Telephone Encounter (Signed)
 Patient needs anticoagulation   ? Other options   [place] with pharmacy

## 2023-06-15 NOTE — Telephone Encounter (Signed)
 Patient has $255 deductible. Once that is paid, cost will be $47/month. They can sign up for payment plan and pay over 12 months instead of upfront if they cannot afford the ~$300 at once.  Daibigatran has a good rx coupon for $69 for [facility] but in the end it ends up being more than Eliquis  ($828 per year vs $815 per year for Eliquis )  The only other option is warfarin but they will have to come for frequent monitoring and there could be copay for the coumadin clinic.

## 2023-06-15 NOTE — Telephone Encounter (Signed)
 Left message for patient's daughter Jearld Adjutant. Detailed message left with Pharm D's response. Provided office number for callback.

## 2023-06-26 DIAGNOSIS — I10 Essential (primary) hypertension: Secondary | ICD-10-CM | POA: Diagnosis not present

## 2023-06-26 DIAGNOSIS — E7800 Pure hypercholesterolemia, unspecified: Secondary | ICD-10-CM | POA: Diagnosis not present

## 2023-06-26 DIAGNOSIS — D6859 Other primary thrombophilia: Secondary | ICD-10-CM | POA: Diagnosis not present

## 2023-06-26 DIAGNOSIS — I4821 Permanent atrial fibrillation: Secondary | ICD-10-CM | POA: Diagnosis not present

## 2023-06-26 DIAGNOSIS — Z0000 Encounter for general adult medical examination without abnormal findings: Secondary | ICD-10-CM | POA: Diagnosis not present

## 2023-06-26 DIAGNOSIS — Z23 Encounter for immunization: Secondary | ICD-10-CM | POA: Diagnosis not present

## 2023-06-26 DIAGNOSIS — N1831 Chronic kidney disease, stage 3a: Secondary | ICD-10-CM | POA: Diagnosis not present

## 2023-06-26 DIAGNOSIS — H35033 Hypertensive retinopathy, bilateral: Secondary | ICD-10-CM | POA: Diagnosis not present

## 2023-08-10 DIAGNOSIS — H401421 Capsular glaucoma with pseudoexfoliation of lens, left eye, mild stage: Secondary | ICD-10-CM | POA: Diagnosis not present

## 2023-08-10 DIAGNOSIS — H401412 Capsular glaucoma with pseudoexfoliation of lens, right eye, moderate stage: Secondary | ICD-10-CM | POA: Diagnosis not present

## 2023-08-26 ENCOUNTER — Other Ambulatory Visit: Payer: Self-pay | Admitting: Internal Medicine

## 2023-09-03 DIAGNOSIS — N1832 Chronic kidney disease, stage 3b: Secondary | ICD-10-CM | POA: Diagnosis not present

## 2023-09-13 DIAGNOSIS — I129 Hypertensive chronic kidney disease with stage 1 through stage 4 chronic kidney disease, or unspecified chronic kidney disease: Secondary | ICD-10-CM | POA: Diagnosis not present

## 2023-09-13 DIAGNOSIS — N2889 Other specified disorders of kidney and ureter: Secondary | ICD-10-CM | POA: Diagnosis not present

## 2023-09-13 DIAGNOSIS — M549 Dorsalgia, unspecified: Secondary | ICD-10-CM | POA: Diagnosis not present

## 2023-09-13 DIAGNOSIS — N1832 Chronic kidney disease, stage 3b: Secondary | ICD-10-CM | POA: Diagnosis not present

## 2023-09-13 DIAGNOSIS — R0602 Shortness of breath: Secondary | ICD-10-CM | POA: Diagnosis not present

## 2023-09-13 DIAGNOSIS — I4891 Unspecified atrial fibrillation: Secondary | ICD-10-CM | POA: Diagnosis not present

## 2023-11-22 ENCOUNTER — Other Ambulatory Visit: Payer: Self-pay | Admitting: Physician Assistant

## 2023-11-23 ENCOUNTER — Other Ambulatory Visit: Payer: Self-pay | Admitting: Internal Medicine

## 2023-11-23 ENCOUNTER — Telehealth: Payer: Self-pay | Admitting: Internal Medicine

## 2023-11-23 MED ORDER — CARVEDILOL 6.25 MG PO TABS: 6.2500 mg | ORAL_TABLET | Freq: Two times a day (BID) | ORAL | 0 refills | Status: DC

## 2023-11-23 MED ORDER — AMLODIPINE BESYLATE 5 MG PO TABS: 5.0000 mg | ORAL_TABLET | Freq: Every day | ORAL | 0 refills | Status: DC

## 2023-11-23 MED ORDER — ROSUVASTATIN CALCIUM 10 MG PO TABS: 10.0000 mg | ORAL_TABLET | Freq: Every day | ORAL | 0 refills | Status: DC

## 2023-11-23 MED ORDER — APIXABAN 5 MG PO TABS: 5.0000 mg | ORAL_TABLET | Freq: Two times a day (BID) | ORAL | 5 refills | Status: DC

## 2023-11-23 NOTE — Telephone Encounter (Signed)
 Prescription refill request for Eliquis  received. Indication:afib Last office visit:11/24 Scr:1.81  10/24 Age: 73 Weight:78.9  kg  Prescription refilled

## 2023-11-23 NOTE — Telephone Encounter (Signed)
*  STAT* If patient is at the pharmacy, call can be transferred to refill team.   1. Which medications need to be refilled? (please list name of each medication and dose if known)   amLODipine  (NORVASC ) 5 MG tablet  apixaban  (ELIQUIS ) 5 MG TABS tablet  carvedilol  (COREG ) 6.25 MG tablet  rosuvastatin  (CRESTOR ) 10 MG tablet   2. Would you like to learn more about the convenience, safety, & potential cost savings by using the [facility]?   3. Are you open to using the [facility] (Type [facility]. ).  4. Which pharmacy/location (including street and city if local pharmacy) is medication to be sent to?  [facility] [address] [place] - [address]   5. Do they need a 30 day or 90 day supply?   30 day  Daughter (Marina ) stated patient will be travelling for a month and will run out of these medications

## 2023-11-23 NOTE — Telephone Encounter (Signed)
 Caller (Marina ) stated patient will be going on an international trip and wants advice on precautions patient should be taking for long trip.

## 2023-11-23 NOTE — Telephone Encounter (Signed)
 Pt's medications were sent to pt's pharmacy as requested. Confirmation received.

## 2023-11-26 MED ORDER — CARVEDILOL 6.25 MG PO TABS
6.2500 mg | ORAL_TABLET | Freq: Two times a day (BID) | ORAL | 1 refills | Status: AC
Start: 2023-11-26 — End: ?

## 2023-11-26 MED ORDER — AMLODIPINE BESYLATE 5 MG PO TABS
5.0000 mg | ORAL_TABLET | Freq: Every day | ORAL | 1 refills | Status: AC
Start: 2023-11-26 — End: ?

## 2023-11-26 MED ORDER — APIXABAN 5 MG PO TABS
5.0000 mg | ORAL_TABLET | Freq: Two times a day (BID) | ORAL | 2 refills | Status: AC
Start: 2023-11-26 — End: ?

## 2023-11-26 MED ORDER — ROSUVASTATIN CALCIUM 10 MG PO TABS: 10.0000 mg | ORAL_TABLET | Freq: Every day | ORAL | 1 refills | Status: AC

## 2023-11-26 NOTE — Telephone Encounter (Signed)
 Left message for patient to call back

## 2023-11-26 NOTE — Telephone Encounter (Signed)
Pt advised Dr. Harrington Challenger' recommendations.

## 2023-11-29 NOTE — Progress Notes (Signed)
 [facility] - Clinic Note  11/30/2023     CHIEF COMPLAINT Patient presents for Retina Follow Up  HISTORY OF PRESENT ILLNESS: Billy Harrell is a 73 y.o. male who presents to the clinic today for:   HPI     Retina Follow Up   Patient presents with  Other.  In both eyes.  This started 9 months ago.  I, the attending physician,  performed the HPI with the patient and updated documentation appropriately.        Comments   Patient here for 9 months retina follow up for HTN Ret. OU. Patient states vision same as before. Blurry when too much light. No eye pain.      Last edited by Valdemar Rogue, MD on 12/02/2023  8:50 PM.     Pt states his left eye gets blurry when he is in a lot of light, he states it looks like a glare, no new health concerns or medications  Referring physician: Fleeta Zerita DASEN, MD [address] [place],  [place] [address]  HISTORICAL INFORMATION:   Selected notes from the MEDICAL RECORD NUMBER Referred by Dr. DeMarco for retina eval   CURRENT MEDICATIONS: Current Outpatient Medications (Ophthalmic Drugs)  Medication Sig   dorzolamide-timolol (COSOPT) 22.3-6.8 MG/ML ophthalmic solution Place 1 drop into both eyes 2 (two) times daily.   No current facility-administered medications for this visit. (Ophthalmic Drugs)   Current Outpatient Medications (Other)  Medication Sig   amLODipine  (NORVASC ) 5 MG tablet Take 1 tablet (5 mg total) by mouth daily.   apixaban  (ELIQUIS ) 5 MG TABS tablet Take 1 tablet (5 mg total) by mouth 2 (two) times daily.   carvedilol  (COREG ) 6.25 MG tablet Take 1 tablet (6.25 mg total) by mouth 2 (two) times daily.   fluticasone (FLONASE) 50 MCG/ACT nasal spray Place 1 spray into both nostrils as needed for allergies or rhinitis.   Multiple Vitamin (MULTIVITAMIN WITH MINERALS) TABS tablet Take 1 tablet by mouth in the morning.   Omega-3 Fatty Acids (FISH OIL) 1000 MG CAPS Take 1,000 mg by mouth in the morning.    rosuvastatin  (CRESTOR ) 10 MG tablet Take 1 tablet (10 mg total) by mouth daily.   No current facility-administered medications for this visit. (Other)   REVIEW OF SYSTEMS: ROS   Positive for: Genitourinary, Endocrine, Cardiovascular, Eyes Negative for: Constitutional, Gastrointestinal, Neurological, Skin, Musculoskeletal, HENT, Respiratory, Psychiatric, Allergic/Imm, Heme/Lymph Last edited by Orval Asberry RAMAN, COA on 11/30/2023 12:36 PM.      ALLERGIES No Known Allergies  PAST MEDICAL HISTORY Past Medical History:  Diagnosis Date   Aortic insufficiency 04/17/2022   Echocardiogram 04/2022: EF 55-60, no RWMA, GLS -18.6, normal RVSF, normal PASP (RVSP 33), mild LAE, mod RAE, mild to mod MR, mod TR, mod to severe AI, AV sclerosis w/o AS, mod PI, mild dilation of aortic root (43 mm), mild dilation of ascending aorta (41 mm), RAP 8   Cataract    Mixed form OU   Chronic kidney disease    ACE inhibitor and HCTZ DCd in past due to rising Creatinine   History of nuclear stress test    Myoview  04/2022: EF 65, normal perfusion, low risk   Hypertension    US  6/19:  neg for RA stenosis bilat; normal mesenteric arteries   Hypertensive retinopathy    OU   Permanent atrial fibrillation    s/p DCCV 04/2017 >> failed // CHADS2-VASc=2 >> Apixaban  Rx   Pure hypercholesterolemia 12/06/2022   Past Surgical  History:  Procedure Laterality Date   CARDIOVERSION N/A 04/30/2017   Procedure: CARDIOVERSION;  Surgeon: Delford Maude BROCKS, MD;  Location: [facility] ENDOSCOPY;  Service: Cardiovascular;  Laterality: N/A;   CATARACT EXTRACTION     TEE WITHOUT CARDIOVERSION N/A 04/21/2022   Procedure: TRANSESOPHAGEAL ECHOCARDIOGRAM (TEE);  Surgeon: Francyne Headland, MD;  Location: [facility] ENDOSCOPY;  Service: Cardiovascular;  Laterality: N/A;   FAMILY HISTORY History reviewed. No pertinent family history.  SOCIAL HISTORY Social History   Tobacco Use   Smoking status: Never   Smokeless tobacco: Never  Vaping Use    Vaping status: Never Used  Substance Use Topics   Alcohol use: No   Drug use: No       OPHTHALMIC EXAM:  Base Eye Exam     Visual Acuity (Snellen - Linear)       Right Left   Dist [place] 20/25 -2 20/25 -2   Dist ph [place] NI NI         Tonometry (Tonopen, 12:34 PM)       Right Left   Pressure 15 17         Pupils       Dark Light Shape React APD   Right 3 2 Round Minimal None   Left 3 2 Round Minimal None         Visual Fields (Counting fingers)       Left Right    Full Full         Extraocular Movement       Right Left    Full, Ortho Full, Ortho         Neuro/Psych     Oriented x3: Yes   Mood/Affect: Normal         Dilation     Both eyes: 1.0% Mydriacyl, 2.5% Phenylephrine @ 12:33 PM           Slit Lamp and Fundus Exam     Slit Lamp Exam       Right Left   Lids/Lashes Dermatochalasis - upper lid Dermatochalasis - upper lid, mild MGD   Conjunctiva/Sclera Mild Melanosis Mild Melanosis   Cornea arcus, trace PEE, well healed cataract wound mild arcus, trace PEE   Anterior Chamber deep and clear Deep and quiet   Iris Round and dilated, pigment clump at 0800 midzone Round and moderately dilated   Lens Vivity PC IOL in good position, 1+ Posterior capsular opacification 2-3+ Nuclear sclerosis, 2-3+ Cortical cataract, +pseudoexfoliation   Anterior Vitreous Mild syneresis, Posterior vitreous detachment Mild Vitreous syneresis, vitreous condensations         Fundus Exam       Right Left   Disc Pink and Sharp, +cupping Pink and Sharp, +cupping   C/D Ratio 0.7 0.7   Macula Flat, good foveal reflex, central IRH and interval increase in central edema, RPE mottling and clumping, no discreet targets for focal laser, +focal drusen Flat, good foveal reflex, mild RPE mottling and clumping, No heme or edema   Vessels attenuated, Tortuous attenuated, mild tortuosity   Periphery Attached, No heme Attached, No heme           Refraction      Wearing Rx       Sphere Cylinder Axis   Right [place] +0.50 170   Left -0.75 +0.75 170           IMAGING AND PROCEDURES  Imaging and Procedures for @TODAY @  OCT, Retina - OU - Both Eyes  Right Eye Quality was good. Central Foveal Thickness: 347. Progression has worsened. Findings include normal foveal contour, no IRF, no SRF, intraretinal hyper-reflective material (Interval re-development of IRF/central cystic changes and IRHM centrally).   Left Eye Quality was good. Central Foveal Thickness: 260. Progression has been stable. Findings include no IRF, no SRF, abnormal foveal contour.   Notes *Images captured and stored on drive  Diagnosis / Impression:  OD: h/o BRVO - Interval re-development of IRF/central cystic changes and IRHM centrally OS: NFP; no IRF/SRF   Clinical management:  See below  Abbreviations: NFP - Normal foveal profile. CME - cystoid macular edema. PED - pigment epithelial detachment. IRF - intraretinal fluid. SRF - subretinal fluid. EZ - ellipsoid zone. ERM - epiretinal membrane. ORA - outer retinal atrophy. ORT - outer retinal tubulation. SRHM - subretinal hyper-reflective material            ASSESSMENT/PLAN:   ICD-10-CM   1. Essential hypertension  I10 OCT, Retina - OU - Both Eyes    2. Hypertensive retinopathy of both eyes  H35.033     3. Retinal hemorrhage of both eyes  H35.63     4. Branch retinal vein occlusion of right eye with macular edema  H34.8310     5. Combined forms of age-related cataract of left eye  H25.812     6. Pseudophakia  Z96.1     7. 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 Hypertensive retinopathy with retinal hemorrhage OU and retinal edema OD  - s/p IVA OD #1 (10.11.22), #2 (11.08.22), #3 (12.06.22), #4 (1.3.23) -- IVA resistance  - s/p IVE OD #1 (sample) 01.31.23, #2 (03.01.23), #3 (03.29.23), #4 (04.20.23), #5 (05.24.23), #6 (06.21.23), #7  (07.26.23), #8 (09.13.23), #9 (11.08.23)  - original OCT showed focal IRF/IRHM OD; OS with mild IRHM, no edema  - FA (01.14.21) shows focal, parafoveal hyperfluorescent leakage OU (OD >> OS)  - repeat FA 1.11.22 showed mild improvement in perifoveal leakage OD  - BP at initial visit 140s / 99 and 96 in R and L arms respectively  - suspect hemorrhage and edema related to HTN and use of eliquis  for A fib  - differential includes mild, old BRVO w/ CME OD -- very similar in appearance to DME, but pt does not carry diagnosis of DM  - BP meds adjusted and BP improved  - exam shows Interval re-development of IRF/central cystic changes and IRHM centrally - BCVA OD 20/25  - OCT shows OD: Interval re-development of IRF/central cystic changes and IRHM centrally at 19+ mos since last IVE  - pt's daughter reports development of macular edema around same time pt started latanoprost  - ?PGA-induced edema  - stopped latanoprost and started brimonidine  BID OU -- no significant improvement in edema  - stopped PF and Prolensa   - recommend holding injection today with follow up in 6 wks          - pt in agreement - see procedure note - f/u 6 weeks, sooner prn -- DFE/OCT,  5. 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  - discussed diagnosis and progression  - under the expert management of Dr. Fleeta  6. Pseudophakia OD  - s/p CE/IOL OD (Vivity IOL - Dr. 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 Ocular hypertension OU  - IOP 15,17 today  - started on Latanoprost by Dr. Carlus  - edema OD started about the same time per daughter  - switched from Latan to brim due to possible macular edema from latan  - currently on Cosopt bid OU per Dr. Fleeta  Ophthalmic Meds Ordered this visit:  No orders of the defined types were  placed in this encounter.    Return in about 6 weeks (around 01/11/2024) for f/u HTN ret OU, DFE, OCT.  There are no Patient Instructions on file for this visit.  This document serves as a record of services personally performed by Redell JUDITHANN Hans, MD, PhD. It was created on their behalf by Delon Newness COT, an ophthalmic technician. The creation of this record is the provider's dictation and/or activities during the visit.    Electronically signed by: Delon Newness COT 06.26.25  8:54 PM  This document serves as a record of services personally performed by Redell JUDITHANN Hans, MD, PhD. It was created on their behalf by Alan PARAS. Delores, OA an ophthalmic technician. The creation of this record is the provider's dictation and/or activities during the visit.    Electronically signed by: Alan PARAS. Delores, OA 12/02/23 8:54 PM  Redell JUDITHANN Hans, M.D., Ph.D. Diseases & Surgery of the Retina and Vitreous [facility] 11/30/2023   I have reviewed the above documentation for accuracy and completeness, and I agree with the above. Redell JUDITHANN Hans, M.D., Ph.D. 12/02/23 8:56 PM  Abbreviations: M myopia (nearsighted); A astigmatism; H hyperopia (farsighted); P presbyopia; Mrx spectacle prescription;  CTL contact lenses; OD right eye; OS left eye; OU both eyes  XT exotropia; ET esotropia; PEK punctate epithelial keratitis; PEE punctate epithelial erosions; DES dry eye syndrome; MGD meibomian gland dysfunction; ATs artificial tears; PFAT's preservative free artificial tears; NSC nuclear sclerotic cataract; PSC posterior subcapsular cataract; ERM epi-retinal membrane; PVD posterior vitreous detachment; RD retinal detachment; DM diabetes mellitus; DR diabetic retinopathy; NPDR non-proliferative diabetic retinopathy; PDR proliferative diabetic retinopathy; CSME clinically significant macular edema; DME diabetic macular edema; dbh dot blot hemorrhages; CWS cotton wool spot; POAG primary  open angle glaucoma; C/D cup-to-disc ratio; HVF humphrey visual field; GVF goldmann visual field; OCT optical coherence tomography; IOP intraocular pressure; BRVO Branch retinal vein occlusion; CRVO central retinal vein occlusion; CRAO central retinal artery occlusion; BRAO branch retinal artery occlusion; RT retinal tear; SB scleral buckle; PPV pars plana vitrectomy; VH Vitreous hemorrhage; PRP panretinal laser photocoagulation; IVK intravitreal kenalog; VMT vitreomacular traction; MH Macular hole;  NVD neovascularization of the disc; NVE neovascularization elsewhere; AREDS age related eye disease study; ARMD age related macular degeneration; POAG primary open angle glaucoma; EBMD epithelial/anterior basement membrane dystrophy; ACIOL anterior chamber intraocular lens; IOL intraocular lens; PCIOL posterior chamber intraocular lens; Phaco/IOL phacoemulsification with intraocular lens placement; PRK photorefractive keratectomy; LASIK laser assisted in situ keratomileusis; HTN hypertension; DM diabetes mellitus; COPD chronic obstructive pulmonary disease

## 2023-11-30 ENCOUNTER — Ambulatory Visit (INDEPENDENT_AMBULATORY_CARE_PROVIDER_SITE_OTHER): Payer: Medicare Other | Admitting: Ophthalmology

## 2023-11-30 ENCOUNTER — Encounter (INDEPENDENT_AMBULATORY_CARE_PROVIDER_SITE_OTHER): Payer: Self-pay | Admitting: Ophthalmology

## 2023-11-30 DIAGNOSIS — I10 Essential (primary) hypertension: Secondary | ICD-10-CM | POA: Diagnosis not present

## 2023-11-30 DIAGNOSIS — H35033 Hypertensive retinopathy, bilateral: Secondary | ICD-10-CM | POA: Diagnosis not present

## 2023-11-30 DIAGNOSIS — Z961 Presence of intraocular lens: Secondary | ICD-10-CM | POA: Diagnosis not present

## 2023-11-30 DIAGNOSIS — H3563 Retinal hemorrhage, bilateral: Secondary | ICD-10-CM

## 2023-11-30 DIAGNOSIS — H34831 Tributary (branch) retinal vein occlusion, right eye, with macular edema: Secondary | ICD-10-CM

## 2023-11-30 DIAGNOSIS — H25812 Combined forms of age-related cataract, left eye: Secondary | ICD-10-CM

## 2023-11-30 DIAGNOSIS — H40053 Ocular hypertension, bilateral: Secondary | ICD-10-CM | POA: Diagnosis not present

## 2023-11-30 DIAGNOSIS — H268 Other specified cataract: Secondary | ICD-10-CM

## 2023-12-02 ENCOUNTER — Encounter (INDEPENDENT_AMBULATORY_CARE_PROVIDER_SITE_OTHER): Payer: Self-pay | Admitting: Ophthalmology

## 2024-01-04 NOTE — Progress Notes (Shared)
 [facility] - Clinic Note  01/18/2024     CHIEF COMPLAINT Patient presents for Retina Follow Up  HISTORY OF PRESENT ILLNESS: Billy Harrell is a 73 y.o. male who presents to the clinic today for:   HPI     Retina Follow Up   Patient presents with  Other.  In both eyes.  This started 6 weeks ago.        Comments   Patient here for 6 weeks retina follow up for Ret Htn OU. Patient states vision is getting weak. No eye pain.       Last edited by Orval Asberry RAMAN, COA on 01/18/2024  1:42 PM.      Pt states VA has been a bit difficult, DVA and NVA. Glasses 73 year old. Reports good BP readings at home, nothing over 150.   Referring physician: Okey Carlin Redbird, MD [address] [place],  [place] [address]  HISTORICAL INFORMATION:   Selected notes from the MEDICAL RECORD NUMBER Referred by Dr. DeMarco for retina eval   CURRENT MEDICATIONS: Current Outpatient Medications (Ophthalmic Drugs)  Medication Sig   dorzolamide-timolol (COSOPT) 22.3-6.8 MG/ML ophthalmic solution Place 1 drop into both eyes 2 (two) times daily.   No current facility-administered medications for this visit. (Ophthalmic Drugs)   Current Outpatient Medications (Other)  Medication Sig   amLODipine  (NORVASC ) 5 MG tablet Take 1 tablet (5 mg total) by mouth daily.   apixaban  (ELIQUIS ) 5 MG TABS tablet Take 1 tablet (5 mg total) by mouth 2 (two) times daily.   carvedilol  (COREG ) 6.25 MG tablet Take 1 tablet (6.25 mg total) by mouth 2 (two) times daily.   fluticasone (FLONASE) 50 MCG/ACT nasal spray Place 1 spray into both nostrils as needed for allergies or rhinitis.   Multiple Vitamin (MULTIVITAMIN WITH MINERALS) TABS tablet Take 1 tablet by mouth in the morning.   Omega-3 Fatty Acids (FISH OIL) 1000 MG CAPS Take 1,000 mg by mouth in the morning.   rosuvastatin  (CRESTOR ) 10 MG tablet Take 1 tablet (10 mg total) by mouth daily.   No current facility-administered medications for this  visit. (Other)   REVIEW OF SYSTEMS: ROS   Positive for: Genitourinary, Endocrine, Cardiovascular, Eyes Negative for: Constitutional, Gastrointestinal, Neurological, Skin, Musculoskeletal, HENT, Respiratory, Psychiatric, Allergic/Imm, Heme/Lymph Last edited by Orval Asberry RAMAN, COA on 01/18/2024  1:42 PM.       ALLERGIES No Known Allergies  PAST MEDICAL HISTORY Past Medical History:  Diagnosis Date   Aortic insufficiency 04/17/2022   Echocardiogram 04/2022: EF 55-60, no RWMA, GLS -18.6, normal RVSF, normal PASP (RVSP 33), mild LAE, mod RAE, mild to mod MR, mod TR, mod to severe AI, AV sclerosis w/o AS, mod PI, mild dilation of aortic root (43 mm), mild dilation of ascending aorta (41 mm), RAP 8   Cataract    Mixed form OU   Chronic kidney disease    ACE inhibitor and HCTZ DCd in past due to rising Creatinine   History of nuclear stress test    Myoview  04/2022: EF 65, normal perfusion, low risk   Hypertension    US  6/19:  neg for RA stenosis bilat; normal mesenteric arteries   Hypertensive retinopathy    OU   Permanent atrial fibrillation    s/p DCCV 04/2017 >> failed // CHADS2-VASc=2 >> Apixaban  Rx   Pure hypercholesterolemia 12/06/2022   Past Surgical History:  Procedure Laterality Date   CARDIOVERSION N/A 04/30/2017   Procedure: CARDIOVERSION;  Surgeon: Delford Coy  C, MD;  Location: [facility] ENDOSCOPY;  Service: Cardiovascular;  Laterality: N/A;   CATARACT EXTRACTION     TEE WITHOUT CARDIOVERSION N/A 04/21/2022   Procedure: TRANSESOPHAGEAL ECHOCARDIOGRAM (TEE);  Surgeon: Francyne Headland, MD;  Location: [facility] ENDOSCOPY;  Service: Cardiovascular;  Laterality: N/A;   FAMILY HISTORY History reviewed. No pertinent family history.  SOCIAL HISTORY Social History   Tobacco Use   Smoking status: Never   Smokeless tobacco: Never  Vaping Use   Vaping status: Never Used  Substance Use Topics   Alcohol use: No   Drug use: No       OPHTHALMIC EXAM:  Base Eye Exam     Visual  Acuity (Snellen - Linear)       Right Left   Dist [place] 20/25 20/25 -2    Correction: Glasses         Tonometry (Tonopen, 1:39 PM)       Right Left   Pressure 17 19         Pupils       Dark Light Shape React APD   Right 3 2 Round Minimal None   Left 3 2 Round Minimal None         Visual Fields (Counting fingers)       Left Right    Full Full         Extraocular Movement       Right Left    Full, Ortho Full, Ortho         Neuro/Psych     Oriented x3: Yes   Mood/Affect: Normal         Dilation     Both eyes: 1.0% Mydriacyl, 2.5% Phenylephrine @ 1:39 PM           Refraction     Wearing Rx       Sphere Cylinder Axis Add   Right [place] +0.50 166 +2.25   Left -0.25 +0.25 162 +2.25           IMAGING AND PROCEDURES  Imaging and Procedures for @TODAY @  OCT, Retina - OU - Both Eyes       Right Eye Quality was good. Central Foveal Thickness: 330. Progression has worsened. Findings include normal foveal contour, no IRF, no SRF, intraretinal hyper-reflective material (Interval re-development of IRF/central cystic changes and IRHM centrally).   Left Eye Quality was good. Central Foveal Thickness: 259. Progression has been stable. Findings include no IRF, no SRF, abnormal foveal contour.   Notes *Images captured and stored on drive  Diagnosis / Impression:  OD: h/o BRVO - Interval re-development of IRF/central cystic changes and IRHM centrally OS: NFP; no IRF/SRF   Clinical management:  See below  Abbreviations: NFP - Normal foveal profile. CME - cystoid macular edema. PED - pigment epithelial detachment. IRF - intraretinal fluid. SRF - subretinal fluid. EZ - ellipsoid zone. ERM - epiretinal membrane. ORA - outer retinal atrophy. ORT - outer retinal tubulation. SRHM - subretinal hyper-reflective material             ASSESSMENT/PLAN:   ICD-10-CM   1. Essential hypertension  I10 OCT, Retina - OU - Both Eyes    2. Hypertensive  retinopathy of both eyes  H35.033     3. Retinal hemorrhage of both eyes  H35.63     4. Branch retinal vein occlusion of right eye with macular edema  H34.8310     5. Combined forms of age-related cataract of left eye  H25.812  6. Pseudophakia  Z96.1     7. 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 Hypertensive retinopathy with retinal hemorrhage OU and retinal edema OD  - s/p IVA OD #1 (10.11.22), #2 (11.08.22), #3 (12.06.22), #4 (1.3.23) -- IVA resistance  - s/p IVE OD #1 (sample) 01.31.23, #2 (03.01.23), #3 (03.29.23), #4 (04.20.23), #5 (05.24.23), #6 (06.21.23), #7 (07.26.23), #8 (09.13.23), #9 (11.08.23)  - original OCT showed focal IRF/IRHM OD; OS with mild IRHM, no edema  - FA (01.14.21) shows focal, parafoveal hyperfluorescent leakage OU (OD >> OS)  - repeat FA 1.11.22 showed mild improvement in perifoveal leakage OD  - BP at initial visit 140s / 99 and 96 in R and L arms respectively  - suspect hemorrhage and edema related to HTN and use of eliquis  for A fib  - differential includes mild, old BRVO w/ CME OD -- very similar in appearance to DME, but pt does not carry diagnosis of DM  - BP meds adjusted and BP improved  - exam shows Interval re-development of IRF/central cystic changes and IRHM centrally - BCVA OD 20/25  - OCT shows NI:[id number] IRF/central cystic changes and IRHM--slightly improved at 19+ mos since last IVE  - pt's daughter reports development of macular edema around same time pt started latanoprost  - ?PGA-induced edema  - stopped latanoprost and started brimonidine  BID OU -- no significant improvement in edema  - stopped PF and Prolensa   - recommend holding injection again today with follow up in 3 months          - pt in agreement - see procedure note - f/u 3 months, sooner prn -- DFE/OCT  5. Mixed form age related cataract OS  - The symptoms of cataract, surgical options, and  treatments and risks were discussed with patient.  - discussed diagnosis and progression  - under the expert management of Dr. Fleeta  6. Pseudophakia OD  - s/p CE/IOL OD (Vivity IOL - Dr. 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 Ocular hypertension OU  - IOP 17,19 today  - started on Latanoprost by Dr. Carlus  - edema OD started about the same time per daughter  - switched from Latan to brim due to possible macular edema from latan  - currently on Cosopt bid OU per Dr. Fleeta  Ophthalmic Meds Ordered this visit:  No orders of the defined types were placed in this encounter.    No follow-ups on file.  There are no Patient Instructions on file for this visit.  This document serves as a record of services personally performed by Redell JUDITHANN Hans, MD, PhD. It was created on their behalf by Avelina Pereyra, COA an ophthalmic technician. The creation of this record is the provider's dictation and/or activities during the visit.   Electronically signed by: Avelina GORMAN Pereyra, COT  01/18/24  2:35 PM   This document serves as a record of services personally performed by Redell JUDITHANN Hans, MD, PhD. It was created on their behalf by Almetta Pesa, an ophthalmic technician. The creation of this record is the provider's dictation and/or activities during the visit.    Electronically signed by: Almetta Pesa, OA, 01/18/24  2:43 PM   Redell JUDITHANN Hans, M.D., Ph.D. Diseases & Surgery of the Retina and Vitreous [facility]     Abbreviations: M myopia (nearsighted); A astigmatism; H hyperopia (farsighted); P presbyopia; Mrx spectacle prescription;  CTL contact lenses; OD right eye; OS left eye; OU both eyes  XT exotropia; ET esotropia; PEK punctate epithelial keratitis; PEE punctate epithelial erosions; DES dry eye syndrome; MGD meibomian gland dysfunction; ATs artificial tears;  PFAT's preservative free artificial tears; NSC nuclear sclerotic cataract; PSC posterior subcapsular cataract; ERM epi-retinal membrane; PVD posterior vitreous detachment; RD retinal detachment; DM diabetes mellitus; DR diabetic retinopathy; NPDR non-proliferative diabetic retinopathy; PDR proliferative diabetic retinopathy; CSME clinically significant macular edema; DME diabetic macular edema; dbh dot blot hemorrhages; CWS cotton wool spot; POAG primary open angle glaucoma; C/D cup-to-disc ratio; HVF humphrey visual field; GVF goldmann visual field; OCT optical coherence tomography; IOP intraocular pressure; BRVO Branch retinal vein occlusion; CRVO central retinal vein occlusion; CRAO central retinal artery occlusion; BRAO branch retinal artery occlusion; RT retinal tear; SB scleral buckle; PPV pars plana vitrectomy; VH Vitreous hemorrhage; PRP panretinal laser photocoagulation; IVK intravitreal kenalog; VMT vitreomacular traction; MH Macular hole;  NVD neovascularization of the disc; NVE neovascularization elsewhere; AREDS age related eye disease study; ARMD age related macular degeneration; POAG primary open angle glaucoma; EBMD epithelial/anterior basement membrane dystrophy; ACIOL anterior chamber intraocular lens; IOL intraocular lens; PCIOL posterior chamber intraocular lens; Phaco/IOL phacoemulsification with intraocular lens placement; PRK photorefractive keratectomy; LASIK laser assisted in situ keratomileusis; HTN hypertension; DM diabetes mellitus; COPD chronic obstructive pulmonary disease

## 2024-01-07 DIAGNOSIS — I4821 Permanent atrial fibrillation: Secondary | ICD-10-CM | POA: Insufficient documentation

## 2024-01-07 NOTE — Progress Notes (Signed)
 "      OFFICE NOTE:    Date:  01/08/2024  ID:  Billy Harrell, DOB [birth year], MRN [id number] PCP: Okey Carlin Redbird, MD  [facility] Providers Cardiologist:  Vina Okey, MD        Hypertension BP monitor 07/2019: avg 118/79 RA US  11/07/17: no RAS bilat Permanent atrial fibrillation S/p DCCV 11/18 >> ERAF CHADS2-VASc=2 (HTN, age) >> Apixaban  Chronic kidney disease  ACE inhibitor and HCTZ d/c'd in the past due to worsening Creatinine RA US  neg for RAS 11/2017 Aortic insufficiency  TTE 04/06/17: Mild focal basal septal hypertrophy, EF 55-60, no RWMA, mild AI, mildly dilated Ao root (42 mm), mild MR, mod LAE, mod RAE, mild TR, PASP 32  TTE 04/13/2022: EF 55-60, moderate to severe AI, GLS -18.6, normal RVSF, normal PASP, RVSP 33, BAE, mild-moderate MR, moderate TR, aortic root 43 mm, ascending aorta 41 mm, RAP 8 TEE 04/21/2022: EF 60-65, no RWMA, normal RVSF, severe BAE, trivial MR, mild to moderate AI, mild dilation of ascending aorta (42 mm), mild dilation of aortic root (39 mm), RAP 3 TTE 04/11/2023: EF 60-65, no RWMA, normal RVSF, moderate LAE, mild RAE, mild MR, moderate AI, AV sclerosis, ascending aorta 40 mm, RAP 3  Myoview  04/13/2022: Normal perfusion, low risk Hyperlipidemia        Discussed the use of AI scribe software for clinical note transcription with the patient, who gave verbal consent to proceed. History of Present Illness Billy Harrell is a 73 y.o. male who returns for follow up of AFib, AI, HTN. He was last seen in 12/2022. TTE in 04/2023 continued to show normal EF, mod AI.   He experiences dizziness when bending over, which resolves upon standing. No episodes of syncope or near-syncope. Shortness of breath is absent except in very hot conditions. He describes discomfort in the shoulder and neck area, particularly when walking for extended periods. This has been occurring for almost a year. The pressure does not radiate to the jaw or arms and does not worsen over time. He  sometimes chooses to sit down to relax when the pressure occurs, but it does not force him to stop walking. No associated shortness of breath or chest pain during these episodes. No leg swelling, black stools, bloody stools, or bloody urine. He does not smoke.     Review of Systems  Gastrointestinal:  Negative for hematochezia and melena.  Genitourinary:  Negative for hematuria.  -See HPI    Studies Reviewed:  EKG Interpretation Date/Time:  Tuesday January 08 2024 10:21:17 EDT Ventricular Rate:  62 PR Interval:    QRS Duration:  80 QT Interval:  408 QTC Calculation: 414 R Axis:   82  Text Interpretation: Atrial fibrillation Confirmed by Lelon Hamilton [phone]) on 01/08/2024 10:41:27 AM    Labs 03/07/23 K 4.1, SCr 1.81, ALT 15, TC 131, HDL 36, Tg 90, LDL 78 Results LABS Hb: 12.2 (09/03/2023) Cr: 1.89 (09/03/2023)  Risk Assessment/Calculations: CHA2DS2-VASc Score = 2   This indicates a 2.2% annual risk of stroke. The patient's score is based upon: CHF History: 0 HTN History: 1 Diabetes History: 0 Stroke History: 0 Vascular Disease History: 0 Age Score: 1 Gender Score: 0           Physical Exam:  VS:  BP 139/78   Pulse 62   Ht 5' 8 (1.727 m)   Wt 174 lb 6.4 oz (79.1 kg)   SpO2 97%   BMI 26.52 kg/m  Wt Readings from Last 3 Encounters:  01/08/24 174 lb 6.4 oz (79.1 kg)  12/06/22 174 lb (78.9 kg)  04/21/22 171 lb (77.6 kg)    Constitutional:      Appearance: Healthy appearance. Not in distress.  Neck:     Vascular: JVD normal.  Pulmonary:     Breath sounds: Normal breath sounds. No wheezing. No rales.  Cardiovascular:     Normal rate. Irregularly irregular rhythm.     Murmurs: There is no murmur.  Edema:    Peripheral edema absent.  Abdominal:     Palpations: Abdomen is soft.       Assessment and Plan:    Assessment & Plan Permanent atrial fibrillation (HCC) Rate is controlled. Based on his weight and age, he should remain on Eliquis  5 mg twice  daily.  - Continue Eliquis  5 mg twice daily, Coreg  6.25 mg twice daily  - Follow up 6 mos Nonrheumatic aortic (valve) insufficiency Mild to mod by TEE in 04/2022. TTE in 04/2023 showed mod AI. Repeat Echocardiogram pending in 04/2024.  Essential hypertension BP controlled. Continue Amlodipine  5 mg once daily, Coreg  6.25 mg twice daily  Stage 3b chronic kidney disease (HCC) He follows with Nephrology - Dr. Macel  Bilateral shoulder pain, unspecified chronicity He notes bilateral shoulder pain when he walks. This is not assoc w chest pain or shortness of breath. He sometimes does not have symptoms. This would be an unusual symptom for angina. We discussed proceeding with stress testing vs watchful waiting. Through shared decision making, he prefers to hold off on stress testing. I think this is reasonable. We will see him again in 6 mos or sooner if the symptoms are worse. Consider stress testing if symptoms are worsening or changing.          Dispo:  Return in about 6 months (around 07/10/2024) for Routine Follow Up, w/ Dr. Okey, or Glendia Ferrier, PA-C.  Signed, Glendia Ferrier, PA-C   "

## 2024-01-07 NOTE — Assessment & Plan Note (Signed)
 Mild to mod by TEE in 04/2022. TTE in 04/2023 showed mod AI.

## 2024-01-08 ENCOUNTER — Encounter: Payer: Self-pay | Admitting: Physician Assistant

## 2024-01-08 ENCOUNTER — Ambulatory Visit: Attending: Physician Assistant | Admitting: Physician Assistant

## 2024-01-08 VITALS — BP 139/78 | HR 62 | Ht 68.0 in | Wt 174.4 lb

## 2024-01-08 DIAGNOSIS — M25512 Pain in left shoulder: Secondary | ICD-10-CM

## 2024-01-08 DIAGNOSIS — I351 Nonrheumatic aortic (valve) insufficiency: Secondary | ICD-10-CM

## 2024-01-08 DIAGNOSIS — M25511 Pain in right shoulder: Secondary | ICD-10-CM | POA: Diagnosis not present

## 2024-01-08 DIAGNOSIS — I4821 Permanent atrial fibrillation: Secondary | ICD-10-CM

## 2024-01-08 DIAGNOSIS — N1832 Chronic kidney disease, stage 3b: Secondary | ICD-10-CM

## 2024-01-08 DIAGNOSIS — I10 Essential (primary) hypertension: Secondary | ICD-10-CM | POA: Diagnosis not present

## 2024-01-08 NOTE — Patient Instructions (Addendum)
 Medication Instructions:   Eliquis  5 mg take one tablet twice daily *If you need a refill on your cardiac medications before your next appointment, please call your pharmacy*   Follow-Up: At [facility], you and your health needs are our priority.  As part of our continuing mission to provide you with exceptional heart care, our providers are all part of one team.  This team includes your primary Cardiologist (physician) and Advanced Practice Providers or APPs (Physician Assistants and Nurse Practitioners) who all work together to provide you with the care you need, when you need it.  Your next appointment:   6 month(s)  Provider:   Vina Gull, MD or Glendia Ferrier, PA-C

## 2024-01-08 NOTE — Assessment & Plan Note (Signed)
BP controlled. Continue Amlodipine 5 mg once daily, Coreg 6.25 mg twice daily.

## 2024-01-08 NOTE — Assessment & Plan Note (Signed)
 Rate is controlled. Based on his weight and age, he should remain on Eliquis  5 mg twice daily.  - Continue Eliquis  5 mg twice daily, Coreg  6.25 mg twice daily  - Follow up 6 mos

## 2024-01-08 NOTE — Assessment & Plan Note (Signed)
 He follows with Nephrology - Dr. Macel

## 2024-01-14 DIAGNOSIS — E7800 Pure hypercholesterolemia, unspecified: Secondary | ICD-10-CM | POA: Diagnosis not present

## 2024-01-14 DIAGNOSIS — M549 Dorsalgia, unspecified: Secondary | ICD-10-CM | POA: Diagnosis not present

## 2024-01-14 DIAGNOSIS — I4821 Permanent atrial fibrillation: Secondary | ICD-10-CM | POA: Diagnosis not present

## 2024-01-14 DIAGNOSIS — N1831 Chronic kidney disease, stage 3a: Secondary | ICD-10-CM | POA: Diagnosis not present

## 2024-01-14 DIAGNOSIS — D6859 Other primary thrombophilia: Secondary | ICD-10-CM | POA: Diagnosis not present

## 2024-01-14 DIAGNOSIS — I10 Essential (primary) hypertension: Secondary | ICD-10-CM | POA: Diagnosis not present

## 2024-01-14 DIAGNOSIS — H35033 Hypertensive retinopathy, bilateral: Secondary | ICD-10-CM | POA: Diagnosis not present

## 2024-01-18 ENCOUNTER — Encounter (INDEPENDENT_AMBULATORY_CARE_PROVIDER_SITE_OTHER): Payer: Self-pay | Admitting: Ophthalmology

## 2024-01-18 ENCOUNTER — Ambulatory Visit (INDEPENDENT_AMBULATORY_CARE_PROVIDER_SITE_OTHER): Admitting: Ophthalmology

## 2024-01-18 DIAGNOSIS — H40053 Ocular hypertension, bilateral: Secondary | ICD-10-CM

## 2024-01-18 DIAGNOSIS — H25812 Combined forms of age-related cataract, left eye: Secondary | ICD-10-CM

## 2024-01-18 DIAGNOSIS — H34831 Tributary (branch) retinal vein occlusion, right eye, with macular edema: Secondary | ICD-10-CM

## 2024-01-18 DIAGNOSIS — I10 Essential (primary) hypertension: Secondary | ICD-10-CM | POA: Diagnosis not present

## 2024-01-18 DIAGNOSIS — H3563 Retinal hemorrhage, bilateral: Secondary | ICD-10-CM

## 2024-01-18 DIAGNOSIS — Z961 Presence of intraocular lens: Secondary | ICD-10-CM

## 2024-01-18 DIAGNOSIS — H35033 Hypertensive retinopathy, bilateral: Secondary | ICD-10-CM

## 2024-01-18 DIAGNOSIS — H268 Other specified cataract: Secondary | ICD-10-CM

## 2024-01-22 ENCOUNTER — Encounter (INDEPENDENT_AMBULATORY_CARE_PROVIDER_SITE_OTHER): Payer: Self-pay | Admitting: Ophthalmology

## 2024-02-11 DIAGNOSIS — N1832 Chronic kidney disease, stage 3b: Secondary | ICD-10-CM | POA: Diagnosis not present

## 2024-02-12 DIAGNOSIS — H401412 Capsular glaucoma with pseudoexfoliation of lens, right eye, moderate stage: Secondary | ICD-10-CM | POA: Diagnosis not present

## 2024-02-12 DIAGNOSIS — H401421 Capsular glaucoma with pseudoexfoliation of lens, left eye, mild stage: Secondary | ICD-10-CM | POA: Diagnosis not present

## 2024-02-12 DIAGNOSIS — H04123 Dry eye syndrome of bilateral lacrimal glands: Secondary | ICD-10-CM | POA: Diagnosis not present

## 2024-02-12 DIAGNOSIS — H25812 Combined forms of age-related cataract, left eye: Secondary | ICD-10-CM | POA: Diagnosis not present

## 2024-02-18 ENCOUNTER — Other Ambulatory Visit: Payer: Self-pay | Admitting: Internal Medicine

## 2024-02-18 DIAGNOSIS — I4891 Unspecified atrial fibrillation: Secondary | ICD-10-CM | POA: Diagnosis not present

## 2024-02-18 DIAGNOSIS — N1832 Chronic kidney disease, stage 3b: Secondary | ICD-10-CM | POA: Diagnosis not present

## 2024-02-18 DIAGNOSIS — N2889 Other specified disorders of kidney and ureter: Secondary | ICD-10-CM | POA: Diagnosis not present

## 2024-02-21 ENCOUNTER — Inpatient Hospital Stay
Admission: RE | Admit: 2024-02-21 | Discharge: 2024-02-21 | Discharge status: Home / Self Care | Source: Ambulatory Visit | Attending: Internal Medicine | Admitting: Internal Medicine

## 2024-02-21 DIAGNOSIS — N182 Chronic kidney disease, stage 2 (mild): Secondary | ICD-10-CM | POA: Diagnosis not present

## 2024-02-21 DIAGNOSIS — N1832 Chronic kidney disease, stage 3b: Secondary | ICD-10-CM

## 2024-03-10 ENCOUNTER — Other Ambulatory Visit: Payer: Self-pay | Admitting: *Deleted

## 2024-03-10 DIAGNOSIS — I77810 Thoracic aortic ectasia: Secondary | ICD-10-CM

## 2024-03-10 DIAGNOSIS — I351 Nonrheumatic aortic (valve) insufficiency: Secondary | ICD-10-CM

## 2024-03-10 DIAGNOSIS — I340 Nonrheumatic mitral (valve) insufficiency: Secondary | ICD-10-CM

## 2024-03-18 DIAGNOSIS — M549 Dorsalgia, unspecified: Secondary | ICD-10-CM | POA: Diagnosis not present

## 2024-03-18 DIAGNOSIS — K5900 Constipation, unspecified: Secondary | ICD-10-CM | POA: Diagnosis not present

## 2024-03-18 DIAGNOSIS — M2578 Osteophyte, vertebrae: Secondary | ICD-10-CM | POA: Diagnosis not present

## 2024-03-18 DIAGNOSIS — M47816 Spondylosis without myelopathy or radiculopathy, lumbar region: Secondary | ICD-10-CM | POA: Diagnosis not present

## 2024-03-18 DIAGNOSIS — I129 Hypertensive chronic kidney disease with stage 1 through stage 4 chronic kidney disease, or unspecified chronic kidney disease: Secondary | ICD-10-CM | POA: Diagnosis not present

## 2024-03-18 DIAGNOSIS — M545 Low back pain, unspecified: Secondary | ICD-10-CM | POA: Diagnosis not present

## 2024-03-18 DIAGNOSIS — N1832 Chronic kidney disease, stage 3b: Secondary | ICD-10-CM | POA: Diagnosis not present

## 2024-03-18 DIAGNOSIS — N2889 Other specified disorders of kidney and ureter: Secondary | ICD-10-CM | POA: Diagnosis not present

## 2024-03-24 ENCOUNTER — Other Ambulatory Visit: Payer: Self-pay | Admitting: Physician Assistant

## 2024-03-24 DIAGNOSIS — R17 Unspecified jaundice: Secondary | ICD-10-CM

## 2024-04-01 ENCOUNTER — Ambulatory Visit
Admission: RE | Admit: 2024-04-01 | Discharge: 2024-04-01 | Disposition: A | Source: Ambulatory Visit | Attending: Physician Assistant | Admitting: Physician Assistant

## 2024-04-01 DIAGNOSIS — R17 Unspecified jaundice: Secondary | ICD-10-CM

## 2024-04-18 ENCOUNTER — Ambulatory Visit (HOSPITAL_COMMUNITY)
Admission: RE | Admit: 2024-04-18 | Discharge: 2024-04-18 | Disposition: A | Discharge status: Home / Self Care | Source: Ambulatory Visit | Attending: Cardiology | Admitting: Cardiology

## 2024-04-18 DIAGNOSIS — I351 Nonrheumatic aortic (valve) insufficiency: Secondary | ICD-10-CM | POA: Insufficient documentation

## 2024-04-18 DIAGNOSIS — I77810 Thoracic aortic ectasia: Secondary | ICD-10-CM | POA: Insufficient documentation

## 2024-04-18 DIAGNOSIS — I340 Nonrheumatic mitral (valve) insufficiency: Secondary | ICD-10-CM | POA: Diagnosis present

## 2024-04-18 LAB — ECHOCARDIOGRAM COMPLETE
P 1/2 time: 1221 ms
S' Lateral: 3 cm

## 2024-04-21 ENCOUNTER — Encounter: Payer: Self-pay | Admitting: Physician Assistant

## 2024-04-21 ENCOUNTER — Ambulatory Visit: Payer: Self-pay | Admitting: Physician Assistant

## 2024-04-21 DIAGNOSIS — I351 Nonrheumatic aortic (valve) insufficiency: Secondary | ICD-10-CM

## 2024-04-21 DIAGNOSIS — K7689 Other specified diseases of liver: Secondary | ICD-10-CM

## 2024-04-21 DIAGNOSIS — I77810 Thoracic aortic ectasia: Secondary | ICD-10-CM | POA: Insufficient documentation

## 2024-04-22 NOTE — Progress Notes (Signed)
 [facility] - Clinic Note  04/25/2024     CHIEF COMPLAINT Patient presents for Retina Follow Up  HISTORY OF PRESENT ILLNESS: Billy Harrell is a 73 y.o. male who presents to the clinic today for:   HPI     Retina Follow Up   In both eyes.  This started 3 months ago.  Duration of 3 months.  Since onset it is stable.        Comments   3 month retina follow up essential hypertension pt is reporting no vision changes noticed he denies any flashes haws some floaters  his last BP reading was 133/81 last night       Last edited by Resa Delon ORN, COT on 04/25/2024  1:15 PM.     Pt states VA is stable no changes he's noticed.  Pt takes Eliquis  for Afib  Referring physician: Okey Carlin Redbird, MD [address] [place],  [place] [address]  HISTORICAL INFORMATION:   Selected notes from the MEDICAL RECORD NUMBER Referred by Dr. DeMarco for retina eval   CURRENT MEDICATIONS: Current Outpatient Medications (Ophthalmic Drugs)  Medication Sig   dorzolamide-timolol (COSOPT) 22.3-6.8 MG/ML ophthalmic solution Place 1 drop into both eyes 2 (two) times daily.   No current facility-administered medications for this visit. (Ophthalmic Drugs)   Current Outpatient Medications (Other)  Medication Sig   amLODipine  (NORVASC ) 5 MG tablet Take 1 tablet (5 mg total) by mouth daily.   apixaban  (ELIQUIS ) 5 MG TABS tablet Take 1 tablet (5 mg total) by mouth 2 (two) times daily.   carvedilol  (COREG ) 6.25 MG tablet Take 1 tablet (6.25 mg total) by mouth 2 (two) times daily.   fluticasone (FLONASE) 50 MCG/ACT nasal spray Place 1 spray into both nostrils as needed for allergies or rhinitis.   Multiple Vitamin (MULTIVITAMIN WITH MINERALS) TABS tablet Take 1 tablet by mouth in the morning.   Omega-3 Fatty Acids (FISH OIL) 1000 MG CAPS Take 1,000 mg by mouth in the morning.   rosuvastatin  (CRESTOR ) 10 MG tablet Take 1 tablet (10 mg total) by mouth daily.   No current  facility-administered medications for this visit. (Other)   REVIEW OF SYSTEMS: ROS   Positive for: Genitourinary, Endocrine, Cardiovascular, Eyes Negative for: Constitutional, Gastrointestinal, Neurological, Skin, Musculoskeletal, HENT, Respiratory, Psychiatric, Allergic/Imm, Heme/Lymph Last edited by Resa Delon ORN, COT on 04/25/2024  1:15 PM.        ALLERGIES No Known Allergies  PAST MEDICAL HISTORY Past Medical History:  Diagnosis Date   Aortic insufficiency 04/17/2022   Echocardiogram 04/2022: EF 55-60, no RWMA, GLS -18.6, normal RVSF, normal PASP (RVSP 33), mild LAE, mod RAE, mild to mod MR, mod TR, mod to severe AI, AV sclerosis w/o AS, mod PI, mild dilation of aortic root (43 mm), mild dilation of ascending aorta (41 mm), RAP 8   Ascending aorta dilation 04/21/2024   TTE 04/18/24: EF 55-60, no RWMA, mild LVH, NL RVSF, mild RVE, mod LAE, mild RAE, mild MR, mild AI, mild ascending aorta dilation (41 mm), mild aortic root dilation (43 mm); probable liver cyst    Cataract    Mixed form OU   Chronic kidney disease    ACE inhibitor and HCTZ DCd in past due to rising Creatinine   History of nuclear stress test    Myoview  04/2022: EF 65, normal perfusion, low risk   Hypertension    US  6/19:  neg for RA stenosis bilat; normal mesenteric arteries   Hypertensive retinopathy  OU   Permanent atrial fibrillation    s/p DCCV 04/2017 >> failed // CHADS2-VASc=2 >> Apixaban  Rx   Pure hypercholesterolemia 12/06/2022   Past Surgical History:  Procedure Laterality Date   CARDIOVERSION N/A 04/30/2017   Procedure: CARDIOVERSION;  Surgeon: Delford Maude BROCKS, MD;  Location: [facility] ENDOSCOPY;  Service: Cardiovascular;  Laterality: N/A;   CATARACT EXTRACTION     TEE WITHOUT CARDIOVERSION N/A 04/21/2022   Procedure: TRANSESOPHAGEAL ECHOCARDIOGRAM (TEE);  Surgeon: Francyne Headland, MD;  Location: [facility] ENDOSCOPY;  Service: Cardiovascular;  Laterality: N/A;   FAMILY HISTORY History reviewed.  No pertinent family history.  SOCIAL HISTORY Social History   Tobacco Use   Smoking status: Never   Smokeless tobacco: Never  Vaping Use   Vaping status: Never Used  Substance Use Topics   Alcohol use: No   Drug use: No       OPHTHALMIC EXAM:  Base Eye Exam     Visual Acuity (Snellen - Linear)       Right Left   Dist [place] 20/25 -2 20/25 -3   Dist ph [place] NI NI         Tonometry (Tonopen, 1:19 PM)       Right Left   Pressure 18 18         Pupils       Pupils Dark Light Shape React APD   Right PERRL 3 2 Round Brisk None   Left PERRL 3 2 Round Brisk None         Visual Fields       Left Right    Full Full         Extraocular Movement       Right Left    Full, Ortho Full, Ortho         Neuro/Psych     Oriented x3: Yes   Mood/Affect: Normal         Dilation     Both eyes: 2.5% Phenylephrine @ 1:19 PM           Slit Lamp and Fundus Exam     Slit Lamp Exam       Right Left   Lids/Lashes Dermatochalasis - upper lid Dermatochalasis - upper lid, mild MGD   Conjunctiva/Sclera Mild Melanosis Mild Melanosis   Cornea arcus, trace PEE, well healed cataract wound mild arcus, trace PEE   Anterior Chamber deep and clear Deep and quiet   Iris Round and dilated, pigment clump at 0800 midzone Round and moderately dilated   Lens Vivity PC IOL in good position, 1+ Posterior capsular opacification 2-3+ Nuclear sclerosis, 2-3+ Cortical cataract, +pseudoexfoliation   Anterior Vitreous Mild syneresis, Posterior vitreous detachment Mild Vitreous syneresis, vitreous condensations         Fundus Exam       Right Left   Disc Pink and Sharp, +cupping Pink and Sharp, +cupping   C/D Ratio 0.7 0.7   Macula Flat, blunted foveal reflex, central edema--slightly improved, RPE mottling and clumping, no discreet targets for focal laser, +focal drusen Flat, good foveal reflex, mild RPE mottling and clumping, No heme or edema   Vessels attenuated, Tortuous  attenuated, mild tortuosity   Periphery Attached, No heme Attached, No heme           Refraction     Wearing Rx       Sphere Cylinder Axis Add   Right [place] +0.50 166 +2.25   Left -0.25 +0.25 162 +2.25  IMAGING AND PROCEDURES  Imaging and Procedures for @TODAY @  OCT, Retina - OU - Both Eyes       Right Eye Quality was good. Central Foveal Thickness: 330. Progression has been stable. Findings include normal foveal contour, no IRF, no SRF, intraretinal hyper-reflective material (Persistent IRF/central cystic changes and IRHM).   Left Eye Quality was good. Central Foveal Thickness: 257. Progression has been stable. Findings include no IRF, no SRF, abnormal foveal contour.   Notes *Images captured and stored on drive  Diagnosis / Impression:  OD: h/o BRVO - Persistent IRF/central cystic changes and IRHM OS: NFP; no IRF/SRF   Clinical management:  See below  Abbreviations: NFP - Normal foveal profile. CME - cystoid macular edema. PED - pigment epithelial detachment. IRF - intraretinal fluid. SRF - subretinal fluid. EZ - ellipsoid zone. ERM - epiretinal membrane. ORA - outer retinal atrophy. ORT - outer retinal tubulation. SRHM - subretinal hyper-reflective material              ASSESSMENT/PLAN:   ICD-10-CM   1. Essential hypertension  I10 OCT, Retina - OU - Both Eyes    2. Hypertensive retinopathy of both eyes  H35.033     3. Retinal hemorrhage of both eyes  H35.63     4. Branch retinal vein occlusion of right eye with macular edema (HCC)  H34.8310     5. Combined forms of age-related cataract of left eye  H25.812     6. Pseudophakia  Z96.1     7. 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 Hypertensive retinopathy with retinal hemorrhage OU and retinal edema OD  - s/p IVA OD #1 (10.11.22), #2 (11.08.22), #3 (12.06.22), #4 (1.3.23) -- IVA resistance  - s/p IVE OD #1  (sample) 01.31.23, #2 (03.01.23), #3 (03.29.23), #4 (04.20.23), #5 (05.24.23), #6 (06.21.23), #7 (07.26.23), #8 (09.13.23), #9 (11.08.23)  - original OCT showed focal IRF/IRHM OD; OS with mild IRHM, no edema  - FA (01.14.21) shows focal, parafoveal hyperfluorescent leakage OU (OD >> OS)  - repeat FA 1.11.22 showed mild improvement in perifoveal leakage OD  - BP at initial visit 140s / 99 and 96 in R and L arms respectively  - suspect hemorrhage and edema related to HTN and use of eliquis  for A fib  - differential includes mild, old BRVO w/ CME OD -- very similar in appearance to DME, but pt does not carry diagnosis of DM  - BP meds adjusted and BP improved  - exam shows IRF/central cystic changes and IRHM centrally - BCVA OD 20/25  - OCT shows NI:[id number] IRF/central cystic changes and IRHM-- at 2+ years since last IVE  - pt's daughter reports development of macular edema around same time pt started latanoprost  - ?PGA-induced edema  - stopped latanoprost and brimonidine  (side affects), now taking Cosopt BID OU per Dr. Fleeta.  - stopped PF and Prolensa   - recommend holding injection again today with follow up in 3-4 months          - pt in agreement - see procedure note - f/u 3-4 months, sooner prn -- DFE/OCT  5. 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  - discussed diagnosis and progression  - under the expert management of Dr. Fleeta  6. Pseudophakia OD  - s/p CE/IOL OD (  Vivity IOL - Dr. 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 Ocular hypertension OU  - IOP 18,18 today  - started on Latanoprost by Dr. Carlus  - edema OD started about the same time per daughter  - switched from Latan to brim due to possible macular edema from latan--now just on Cosopt  - currently on Cosopt bid OU per Dr.  Fleeta  Ophthalmic Meds Ordered this visit:  No orders of the defined types were placed in this encounter.    No follow-ups on file.  There are no Patient Instructions on file for this visit.  This document serves as a record of services personally performed by Redell JUDITHANN Hans, MD, PhD. It was created on their behalf by Delon Newness COT, an ophthalmic technician. The creation of this record is the provider's dictation and/or activities during the visit.    Electronically signed by: Delon Newness COT 11.18.2025  2:11 PM  This document serves as a record of services personally performed by Redell JUDITHANN Hans, MD, PhD. It was created on their behalf by Almetta Pesa, an ophthalmic technician. The creation of this record is the provider's dictation and/or activities during the visit.    Electronically signed by: Almetta Pesa, OA, 04/25/24  2:11 PM  Abbreviations: M myopia (nearsighted); A astigmatism; H hyperopia (farsighted); P presbyopia; Mrx spectacle prescription;  CTL contact lenses; OD right eye; OS left eye; OU both eyes  XT exotropia; ET esotropia; PEK punctate epithelial keratitis; PEE punctate epithelial erosions; DES dry eye syndrome; MGD meibomian gland dysfunction; ATs artificial tears; PFAT's preservative free artificial tears; NSC nuclear sclerotic cataract; PSC posterior subcapsular cataract; ERM epi-retinal membrane; PVD posterior vitreous detachment; RD retinal detachment; DM diabetes mellitus; DR diabetic retinopathy; NPDR non-proliferative diabetic retinopathy; PDR proliferative diabetic retinopathy; CSME clinically significant macular edema; DME diabetic macular edema; dbh dot blot hemorrhages; CWS cotton wool spot; POAG primary open angle glaucoma; C/D cup-to-disc ratio; HVF humphrey visual field; GVF goldmann visual field; OCT optical coherence tomography; IOP intraocular pressure; BRVO Branch retinal vein occlusion; CRVO central retinal vein occlusion; CRAO  central retinal artery occlusion; BRAO branch retinal artery occlusion; RT retinal tear; SB scleral buckle; PPV pars plana vitrectomy; VH Vitreous hemorrhage; PRP panretinal laser photocoagulation; IVK intravitreal kenalog; VMT vitreomacular traction; MH Macular hole;  NVD neovascularization of the disc; NVE neovascularization elsewhere; AREDS age related eye disease study; ARMD age related macular degeneration; POAG primary open angle glaucoma; EBMD epithelial/anterior basement membrane dystrophy; ACIOL anterior chamber intraocular lens; IOL intraocular lens; PCIOL posterior chamber intraocular lens; Phaco/IOL phacoemulsification with intraocular lens placement; PRK photorefractive keratectomy; LASIK laser assisted in situ keratomileusis; HTN hypertension; DM diabetes mellitus; COPD chronic obstructive pulmonary disease

## 2024-04-25 ENCOUNTER — Encounter (INDEPENDENT_AMBULATORY_CARE_PROVIDER_SITE_OTHER): Payer: Self-pay | Admitting: Ophthalmology

## 2024-04-25 ENCOUNTER — Ambulatory Visit (INDEPENDENT_AMBULATORY_CARE_PROVIDER_SITE_OTHER): Admitting: Ophthalmology

## 2024-04-25 DIAGNOSIS — H3563 Retinal hemorrhage, bilateral: Secondary | ICD-10-CM

## 2024-04-25 DIAGNOSIS — H34831 Tributary (branch) retinal vein occlusion, right eye, with macular edema: Secondary | ICD-10-CM | POA: Diagnosis not present

## 2024-04-25 DIAGNOSIS — H35033 Hypertensive retinopathy, bilateral: Secondary | ICD-10-CM

## 2024-04-25 DIAGNOSIS — H268 Other specified cataract: Secondary | ICD-10-CM

## 2024-04-25 DIAGNOSIS — H40053 Ocular hypertension, bilateral: Secondary | ICD-10-CM

## 2024-04-25 DIAGNOSIS — I10 Essential (primary) hypertension: Secondary | ICD-10-CM

## 2024-04-25 DIAGNOSIS — Z961 Presence of intraocular lens: Secondary | ICD-10-CM

## 2024-04-25 DIAGNOSIS — H25812 Combined forms of age-related cataract, left eye: Secondary | ICD-10-CM

## 2024-04-28 ENCOUNTER — Encounter (INDEPENDENT_AMBULATORY_CARE_PROVIDER_SITE_OTHER): Payer: Self-pay | Admitting: Ophthalmology

## 2024-07-25 ENCOUNTER — Encounter (INDEPENDENT_AMBULATORY_CARE_PROVIDER_SITE_OTHER): Admitting: Ophthalmology
